# Patient Record
Sex: Female | Born: 1940
Health system: Southern US, Community
[De-identification: ages and names within clinical notes are randomized; demographics above are authoritative.]

## PROBLEM LIST (undated history)

## (undated) DIAGNOSIS — F32A Depression, unspecified: Secondary | ICD-10-CM

## (undated) DIAGNOSIS — R748 Abnormal levels of other serum enzymes: Secondary | ICD-10-CM

## (undated) DIAGNOSIS — E785 Hyperlipidemia, unspecified: Secondary | ICD-10-CM

## (undated) DIAGNOSIS — W57XXXA Bitten or stung by nonvenomous insect and other nonvenomous arthropods, initial encounter: Secondary | ICD-10-CM

## (undated) DIAGNOSIS — F419 Anxiety disorder, unspecified: Secondary | ICD-10-CM

## (undated) DIAGNOSIS — K8681 Exocrine pancreatic insufficiency: Secondary | ICD-10-CM

## (undated) DIAGNOSIS — Z780 Asymptomatic menopausal state: Secondary | ICD-10-CM

## (undated) DIAGNOSIS — C50919 Malignant neoplasm of unspecified site of unspecified female breast: Secondary | ICD-10-CM

## (undated) DIAGNOSIS — K5792 Diverticulitis of intestine, part unspecified, without perforation or abscess without bleeding: Secondary | ICD-10-CM

## (undated) DIAGNOSIS — K219 Gastro-esophageal reflux disease without esophagitis: Secondary | ICD-10-CM

## (undated) DIAGNOSIS — B880 Other acariasis: Secondary | ICD-10-CM

## (undated) DIAGNOSIS — D649 Anemia, unspecified: Secondary | ICD-10-CM

## (undated) DIAGNOSIS — E119 Type 2 diabetes mellitus without complications: Secondary | ICD-10-CM

## (undated) DIAGNOSIS — T7849XD Other allergy, subsequent encounter: Secondary | ICD-10-CM

## (undated) DIAGNOSIS — R079 Chest pain, unspecified: Secondary | ICD-10-CM

## (undated) DIAGNOSIS — N200 Calculus of kidney: Secondary | ICD-10-CM

## (undated) DIAGNOSIS — F329 Major depressive disorder, single episode, unspecified: Secondary | ICD-10-CM

## (undated) DIAGNOSIS — N814 Uterovaginal prolapse, unspecified: Secondary | ICD-10-CM

## (undated) DIAGNOSIS — J4531 Mild persistent asthma with (acute) exacerbation: Secondary | ICD-10-CM

## (undated) DIAGNOSIS — J441 Chronic obstructive pulmonary disease with (acute) exacerbation: Secondary | ICD-10-CM

## (undated) DIAGNOSIS — M5440 Lumbago with sciatica, unspecified side: Secondary | ICD-10-CM

## (undated) DIAGNOSIS — K432 Incisional hernia without obstruction or gangrene: Secondary | ICD-10-CM

## (undated) DIAGNOSIS — K8689 Other specified diseases of pancreas: Secondary | ICD-10-CM

## (undated) DIAGNOSIS — J4530 Mild persistent asthma, uncomplicated: Secondary | ICD-10-CM

## (undated) HISTORY — DX: Mild persistent asthma, uncomplicated: J45.30

## (undated) HISTORY — DX: Other allergy, subsequent encounter: T78.49XD

## (undated) HISTORY — PX: DILATION AND CURETTAGE OF UTERUS: SHX78

## (undated) HISTORY — DX: Depression, unspecified: F32.A

## (undated) HISTORY — DX: Calculus of kidney: N20.0

## (undated) HISTORY — DX: Hyperlipidemia, unspecified: E78.5

## (undated) HISTORY — DX: Other specified diseases of pancreas: K86.89

## (undated) HISTORY — DX: Mild persistent asthma with (acute) exacerbation: J45.31

## (undated) HISTORY — DX: Anxiety disorder, unspecified: F41.9

## (undated) HISTORY — PX: BREAST LUMPECTOMY: SHX2

## (undated) HISTORY — DX: Abnormal levels of other serum enzymes: R74.8

## (undated) HISTORY — DX: Incisional hernia without obstruction or gangrene: K43.2

## (undated) HISTORY — PX: WHIPPLE PROCEDURE: SHX2667

## (undated) HISTORY — DX: Malignant neoplasm of unspecified site of unspecified female breast: C50.919

## (undated) HISTORY — DX: Anemia, unspecified: D64.9

## (undated) HISTORY — DX: Asymptomatic menopausal state: Z78.0

## (undated) HISTORY — DX: Diverticulitis of intestine, part unspecified, without perforation or abscess without bleeding: K57.92

## (undated) HISTORY — DX: Major depressive disorder, single episode, unspecified: F32.9

## (undated) HISTORY — PX: HERNIA REPAIR: SHX51

## (undated) HISTORY — DX: Chest pain, unspecified: R07.9

## (undated) HISTORY — DX: Chronic obstructive pulmonary disease with (acute) exacerbation: J44.1

## (undated) HISTORY — DX: Type 2 diabetes mellitus without complications: E11.9

## (undated) HISTORY — DX: Exocrine pancreatic insufficiency: K86.81

## (undated) HISTORY — DX: Other acariasis: B88.0

## (undated) HISTORY — PX: CHOLECYSTECTOMY: SHX55

## (undated) HISTORY — PX: TUBAL LIGATION: SHX77

## (undated) HISTORY — DX: Gastro-esophageal reflux disease without esophagitis: K21.9

## (undated) HISTORY — DX: Uterovaginal prolapse, unspecified: N81.4

## (undated) HISTORY — PX: NEPHROLITHOTOMY: SUR881

## (undated) HISTORY — DX: Lumbago with sciatica, unspecified side: M54.40

## (undated) HISTORY — DX: Bitten or stung by nonvenomous insect and other nonvenomous arthropods, initial encounter: W57.XXXA

---

## 2000-03-30 ENCOUNTER — Encounter (INDEPENDENT_AMBULATORY_CARE_PROVIDER_SITE_OTHER): Payer: Self-pay | Admitting: Specialist

## 2000-03-30 ENCOUNTER — Other Ambulatory Visit: Admission: RE | Admit: 2000-03-30 | Discharge: 2000-03-30 | Payer: Self-pay | Admitting: Gastroenterology

## 2001-06-10 ENCOUNTER — Ambulatory Visit (HOSPITAL_COMMUNITY): Admission: RE | Admit: 2001-06-10 | Discharge: 2001-06-10 | Payer: Self-pay | Admitting: Family Medicine

## 2001-06-10 ENCOUNTER — Encounter: Payer: Self-pay | Admitting: Family Medicine

## 2002-04-28 HISTORY — PX: COLONOSCOPY: SHX174

## 2002-12-16 ENCOUNTER — Ambulatory Visit (HOSPITAL_COMMUNITY): Admission: RE | Admit: 2002-12-16 | Discharge: 2002-12-16 | Payer: Self-pay | Admitting: Family Medicine

## 2002-12-19 ENCOUNTER — Other Ambulatory Visit: Admission: RE | Admit: 2002-12-19 | Discharge: 2002-12-19 | Payer: Self-pay | Admitting: Dermatology

## 2003-01-03 ENCOUNTER — Other Ambulatory Visit: Admission: RE | Admit: 2003-01-03 | Discharge: 2003-01-03 | Payer: Self-pay | Admitting: Dermatology

## 2003-01-23 ENCOUNTER — Ambulatory Visit (HOSPITAL_COMMUNITY): Admission: RE | Admit: 2003-01-23 | Discharge: 2003-01-23 | Payer: Self-pay | Admitting: Otolaryngology

## 2003-01-23 ENCOUNTER — Encounter: Payer: Self-pay | Admitting: Otolaryngology

## 2004-03-08 ENCOUNTER — Ambulatory Visit (HOSPITAL_COMMUNITY): Admission: RE | Admit: 2004-03-08 | Discharge: 2004-03-08 | Payer: Self-pay

## 2005-04-29 ENCOUNTER — Ambulatory Visit (HOSPITAL_COMMUNITY): Admission: RE | Admit: 2005-04-29 | Discharge: 2005-04-29 | Payer: Self-pay | Admitting: Pulmonary Disease

## 2006-02-16 ENCOUNTER — Ambulatory Visit (HOSPITAL_COMMUNITY): Admission: RE | Admit: 2006-02-16 | Discharge: 2006-02-16 | Payer: Self-pay | Admitting: Pulmonary Disease

## 2006-05-08 ENCOUNTER — Ambulatory Visit (HOSPITAL_COMMUNITY): Admission: RE | Admit: 2006-05-08 | Discharge: 2006-05-08 | Payer: Self-pay | Admitting: Pulmonary Disease

## 2007-02-05 ENCOUNTER — Ambulatory Visit (HOSPITAL_COMMUNITY): Admission: RE | Admit: 2007-02-05 | Discharge: 2007-02-05 | Payer: Self-pay | Admitting: Pulmonary Disease

## 2007-02-06 ENCOUNTER — Inpatient Hospital Stay (HOSPITAL_COMMUNITY): Admission: EM | Admit: 2007-02-06 | Discharge: 2007-02-08 | Payer: Self-pay | Admitting: Emergency Medicine

## 2007-06-03 ENCOUNTER — Ambulatory Visit (HOSPITAL_COMMUNITY): Admission: RE | Admit: 2007-06-03 | Discharge: 2007-06-03 | Payer: Self-pay | Admitting: Pulmonary Disease

## 2008-06-13 ENCOUNTER — Ambulatory Visit (HOSPITAL_COMMUNITY): Admission: RE | Admit: 2008-06-13 | Discharge: 2008-06-13 | Payer: Self-pay | Admitting: Pulmonary Disease

## 2008-11-07 ENCOUNTER — Ambulatory Visit (HOSPITAL_COMMUNITY): Admission: RE | Admit: 2008-11-07 | Discharge: 2008-11-07 | Payer: Self-pay | Admitting: Pulmonary Disease

## 2009-07-31 ENCOUNTER — Ambulatory Visit (HOSPITAL_COMMUNITY): Admission: RE | Admit: 2009-07-31 | Discharge: 2009-07-31 | Payer: Self-pay | Admitting: Pulmonary Disease

## 2009-08-08 ENCOUNTER — Ambulatory Visit (HOSPITAL_COMMUNITY): Admission: RE | Admit: 2009-08-08 | Discharge: 2009-08-08 | Payer: Self-pay | Admitting: Pulmonary Disease

## 2009-08-15 ENCOUNTER — Encounter: Admission: RE | Admit: 2009-08-15 | Discharge: 2009-08-15 | Payer: Self-pay | Admitting: Pulmonary Disease

## 2009-08-26 DIAGNOSIS — C50919 Malignant neoplasm of unspecified site of unspecified female breast: Secondary | ICD-10-CM

## 2009-08-26 HISTORY — DX: Malignant neoplasm of unspecified site of unspecified female breast: C50.919

## 2009-08-26 HISTORY — PX: BREAST LUMPECTOMY: SHX2

## 2009-08-29 ENCOUNTER — Ambulatory Visit (HOSPITAL_COMMUNITY): Admission: RE | Admit: 2009-08-29 | Discharge: 2009-08-29 | Payer: Self-pay | Admitting: General Surgery

## 2009-10-15 ENCOUNTER — Ambulatory Visit: Admission: RE | Admit: 2009-10-15 | Discharge: 2010-01-13 | Payer: Self-pay | Admitting: Radiation Oncology

## 2010-04-28 DIAGNOSIS — K5792 Diverticulitis of intestine, part unspecified, without perforation or abscess without bleeding: Secondary | ICD-10-CM

## 2010-04-28 HISTORY — DX: Diverticulitis of intestine, part unspecified, without perforation or abscess without bleeding: K57.92

## 2010-05-17 ENCOUNTER — Other Ambulatory Visit (HOSPITAL_COMMUNITY): Payer: Self-pay | Admitting: Hematology and Oncology

## 2010-05-17 DIAGNOSIS — C50919 Malignant neoplasm of unspecified site of unspecified female breast: Secondary | ICD-10-CM

## 2010-07-16 LAB — COMPREHENSIVE METABOLIC PANEL
Albumin: 3.9 g/dL (ref 3.5–5.2)
Alkaline Phosphatase: 57 U/L (ref 39–117)
BUN: 12 mg/dL (ref 6–23)
CO2: 28 mEq/L (ref 19–32)
Chloride: 104 mEq/L (ref 96–112)
GFR calc Af Amer: >60 mL/min (ref >60)
GFR calc non Af Amer: >60 mL/min (ref >60)
Glucose, Bld: 112 mg/dL — ABNORMAL HIGH (ref 70–99)
Potassium: 4 mEq/L (ref 3.5–5.1)

## 2010-07-16 LAB — TYPE AND SCREEN
ABO/RH(D): A NEG
Antibody Screen: POSITIVE
DAT, IgG: NEGATIVE

## 2010-07-16 LAB — CBC
HCT: 38.2 % (ref 36.0–46.0)
MCHC: 35.3 g/dL (ref 30.0–36.0)
MCV: 90.1 fL (ref 78.0–100.0)
Platelets: 218 10*3/uL (ref 150–400)
RBC: 4.24 MIL/uL (ref 3.87–5.11)
RDW: 13.3 % (ref 11.5–15.5)

## 2010-08-14 ENCOUNTER — Ambulatory Visit (HOSPITAL_COMMUNITY): Payer: Medicare Other

## 2010-08-21 ENCOUNTER — Ambulatory Visit (HOSPITAL_COMMUNITY)
Admission: RE | Admit: 2010-08-21 | Discharge: 2010-08-21 | Disposition: A | Discharge status: Home / Self Care | Payer: Medicare Other | Source: Ambulatory Visit | Attending: Hematology and Oncology | Admitting: Hematology and Oncology

## 2010-08-21 ENCOUNTER — Encounter (HOSPITAL_COMMUNITY): Payer: Self-pay

## 2010-08-21 ENCOUNTER — Other Ambulatory Visit (HOSPITAL_COMMUNITY): Payer: Self-pay | Admitting: Hematology and Oncology

## 2010-08-21 DIAGNOSIS — C50912 Malignant neoplasm of unspecified site of left female breast: Secondary | ICD-10-CM

## 2010-08-21 DIAGNOSIS — Z853 Personal history of malignant neoplasm of breast: Secondary | ICD-10-CM | POA: Insufficient documentation

## 2010-08-21 DIAGNOSIS — C50919 Malignant neoplasm of unspecified site of unspecified female breast: Secondary | ICD-10-CM

## 2010-08-21 DIAGNOSIS — Z09 Encounter for follow-up examination after completed treatment for conditions other than malignant neoplasm: Secondary | ICD-10-CM | POA: Insufficient documentation

## 2010-08-21 DIAGNOSIS — J45909 Unspecified asthma, uncomplicated: Secondary | ICD-10-CM | POA: Insufficient documentation

## 2010-09-10 NOTE — H&P (Signed)
Betty Nguyen, Betty Nguyen                  ACCOUNT NO.:  [id number]   MEDICAL RECORD NO.:  [id number]          PATIENT TYPE:  INP   LOCATION:  A339                          FACILITY:  [facility]   PHYSICIAN:  Tilford Pillar, MD      DATE OF BIRTH:  1941-02-19   DATE OF ADMISSION:  02/06/2007  DATE OF DISCHARGE:  LH                              HISTORY & PHYSICAL   CHIEF COMPLAINT:  Diffuse abdominal and flank pain.   HISTORY OF PRESENT ILLNESS:  Patient is a 70 year old white female with  a history of gastroesophageal reflux and asthma who had an approximate 4  day history of crampy abdominal pain, started insidiously and included  her bilateral flanks.  There is no radiation other than to the flanks.  There is no exacerbating or relieving features.  She described the pain  as crampy with intermittent sharp pain.  She had never had symptoms like  this before.  She did have a history of renal calculi but stated that  pain was different than her current pain.  She did notice an increase in  discolor of her urine with darkening of her urine, no change in bowel  habits, denies any hematochezia or melena.  She had not had any fever or  chills.  She did report having a colonoscopy in 2004 which demonstrated  diverticulum.  It did not demonstrate any polyps or masses.   PAST MEDICAL HISTORY:  Pertinent for:  1. Gastroesophageal reflux disease.  2. Asthma.  3. Renal calculi.   PAST SURGICAL HISTORY:  Positive for D&C.   MEDICATIONS:  Azmacort and Prilosec.   ALLERGIES:  1. AMOXICILLIN, RASH.  2. CODEINE, NAUSEA.   SOCIAL HISTORY:  1. Negative tobacco use.  2. Negative alcohol use.  3. No recreational drug use.   REVIEW OF SYSTEMS:  CONSTITUTION:  No significant weight changes.  EYES:  Unremarkable.  EARS, NOSE, THROAT:  Unremarkable.  PULMONARY:  Positive  for occasional shortness of breath secondary to her asthma as above-  mentioned.  CARDIOVASCULAR:  Unremarkable.  GASTROINTESTINAL:  See  HPI.  MUSCULOSKELETAL:  Unremarkable.  ENDOCRINE:  Unremarkable.  NEURO:  Unremarkable.   PHYSICAL EXAM:  VITALS:  Temperature 98, heart rate 66, respiratory rate  16, BP 112/63, O2 sat 93% on room air.  GENERAL:  No acute distress.  Alert and oriented x3.  HEENT:  Pupils equal, round, reactive, extraocular movements are intact.  No conjunctival pallor.  No cervical adenopathy.  PULMONARY:  Clear to auscultation with unlabored respirations.  CARDIOVASCULAR:  Regular rate and rhythm.  No murmurs, gallops or rubs  are apparent on examination.  ABDOMINAL EXAM:  Decreased bowel sounds.  Abdomen is soft and obese,  nondistended.  Mild left lower quadrant tenderness.  No peritoneal  signs, no hernias are apparent, no masses are palpated.  EXTREMITIES:  Warm and dry.   PERTINENT LABORATORY AND RADIOGRAPHIC FINDINGS:  A CT evaluation of the  abdomen and pelvis demonstrated positive inflammatory changes around the  sigmoid colon with apparent phlegmon development, no evidence of any  free fluid or  free air and no defined abscess pocket is apparent.   LABORATORY:  CBC:  White blood cell count 11.5 on admission, 13.2.  Hemoglobin, hematocrit 39, platelets 288,000.  Chemistry:  Sodium 141,  potassium 4, chloride 107, bicarbonate 28, BUN 10, creatinine 0.78,  glucose 123, calcium 9.  Urinalysis:  Positive for trace blood and  ketones, some white blood count __________ and positive for bacteria.   ASSESSMENT AND PLAN:  1. Diverticulitis, uncomplicated.  Will continue antibiotics,      currently receiving Cipro and Flagyl.  Continue IV fluid hydration.      I discussed with patient at length the indications for operation      which would include increasing her symptoms and increasing pain,      increasing white blood cell count despite conservative management      with antibiotics or increasing fever or chills, of which patient is      currently not reporting any.  As this has been our first bout  of      diverticulitis, again conservative management would be the      preferred management at this time with no interval resection in the      future planned.  However, it was discussed with the patient that      future bouts would likely elicit a need for operation with sigmoid      colectomy should she have a future bout.  Patient understands this      and understands that should anything change in her current      presentation an operation may still be indicated.  2. Urinary tract infection.  Based on her laboratory evaluation, it is      suspected that she does have coinciding infection.  This should be      covered with the current antibiotic regimen and will be monitored      as well.      Tilford Pillar, MD  Electronically Signed     BZ/MEDQ  D:  02/07/2007  T:  02/07/2007  Job:  [id number]   cc:   Dalia Heading, M.D.  Fax: [phone]   Oneal Deputy. Juanetta Gosling, M.D.  Fax: [phone]

## 2010-09-10 NOTE — Discharge Summary (Signed)
Betty Nguyen, Betty Nguyen NO.:  [id number]   MEDICAL RECORD NO.:  [id number]          PATIENT TYPE:  INP   LOCATION:  A339                          FACILITY:  [facility]   PHYSICIAN:  Dalia Heading, M.D.  DATE OF BIRTH:  [birth year]   DATE OF ADMISSION:  02/06/2007  DATE OF DISCHARGE:  10/13/2008LH                               DISCHARGE SUMMARY   HOSPITAL COURSE:  The patient is a 70 year old white female who  presented to the emergency room with lower abdominal pain and left flank  pain.  CT scan of the abdomen and pelvis was performed which revealed  sigmoid diverticulitis.  In addition, there were nonspecific cystic  lesions seen in the pancreas.  An MRI of the pancreas was suggested in  the future.  The patient was admitted to the hospital for treatment of  her sigmoid diverticulitis.  Her white blood cell count subsequently  improved.  Her abdominal pain resolved.  Diet was starting to be  advanced without difficulty.   Of note is the fact that the patient had a colonoscopy in 2004 by  another physician which was reportedly unremarkable.  The patient is  being discharged home on hospital day #2 in good and improving  condition.   DISCHARGE INSTRUCTIONS:  The patient is to follow up with Dr. Lovell Sheehan or  Dr. Leticia Penna on a February 16, 2007.   DISCHARGE MEDICATIONS:  1. Azmacort as previously prescribed.  2. Prilosec as previously prescribed.  3. Ciprofloxacin 500 mg p.o. b.i.d. x10 days.  4. Flagyl 250 mg p.o. 3 times a day x10 days.   PRINCIPAL DIAGNOSES:  1. Sigmoid diverticulitis, first episode.  2. Nonspecific lesions in pancreas.  3. Chronic obstructive pulmonary disease.  4. History of reflux disease.   PROCEDURES:  None.      Dalia Heading, M.D.  Electronically Signed    MAJ/MEDQ  D:  02/08/2007  T:  02/08/2007  Job:  [id number]   cc:   Ramon Dredge L. Juanetta Gosling, M.D.  Fax: [phone]

## 2010-12-25 ENCOUNTER — Other Ambulatory Visit (HOSPITAL_COMMUNITY): Payer: Self-pay | Admitting: Pulmonary Disease

## 2010-12-25 ENCOUNTER — Ambulatory Visit (HOSPITAL_COMMUNITY)
Admission: RE | Admit: 2010-12-25 | Discharge: 2010-12-25 | Disposition: A | Discharge status: Home / Self Care | Payer: Medicare Other | Source: Ambulatory Visit | Attending: Pulmonary Disease | Admitting: Pulmonary Disease

## 2010-12-25 DIAGNOSIS — M25529 Pain in unspecified elbow: Secondary | ICD-10-CM | POA: Insufficient documentation

## 2010-12-25 DIAGNOSIS — M25549 Pain in joints of unspecified hand: Secondary | ICD-10-CM | POA: Insufficient documentation

## 2010-12-25 DIAGNOSIS — R609 Edema, unspecified: Secondary | ICD-10-CM

## 2010-12-25 DIAGNOSIS — R52 Pain, unspecified: Secondary | ICD-10-CM

## 2010-12-25 DIAGNOSIS — M7989 Other specified soft tissue disorders: Secondary | ICD-10-CM | POA: Insufficient documentation

## 2011-02-06 ENCOUNTER — Ambulatory Visit (HOSPITAL_COMMUNITY)
Admission: RE | Admit: 2011-02-06 | Discharge: 2011-02-06 | Disposition: A | Discharge status: Home / Self Care | Payer: Medicare Other | Source: Ambulatory Visit | Attending: Pulmonary Disease | Admitting: Pulmonary Disease

## 2011-02-06 ENCOUNTER — Other Ambulatory Visit (HOSPITAL_COMMUNITY): Payer: Self-pay | Admitting: Pulmonary Disease

## 2011-02-06 DIAGNOSIS — M79609 Pain in unspecified limb: Secondary | ICD-10-CM | POA: Insufficient documentation

## 2011-02-06 DIAGNOSIS — R609 Edema, unspecified: Secondary | ICD-10-CM

## 2011-02-06 DIAGNOSIS — I8000 Phlebitis and thrombophlebitis of superficial vessels of unspecified lower extremity: Secondary | ICD-10-CM | POA: Insufficient documentation

## 2011-02-06 DIAGNOSIS — M7989 Other specified soft tissue disorders: Secondary | ICD-10-CM | POA: Insufficient documentation

## 2011-02-06 LAB — COMPREHENSIVE METABOLIC PANEL
ALT: 100 — ABNORMAL HIGH
AST: 67 — ABNORMAL HIGH
Alkaline Phosphatase: 105
BUN: 13
Calcium: 9.2
Chloride: 103
Creatinine, Ser: 0.93
GFR calc Af Amer: >60
GFR calc non Af Amer: >60
Potassium: 4.4
Sodium: 139

## 2011-02-06 LAB — URINALYSIS, ROUTINE W REFLEX MICROSCOPIC
Bilirubin Urine: NEGATIVE
Nitrite: NEGATIVE
Specific Gravity, Urine: >1.03 — ABNORMAL HIGH

## 2011-02-06 LAB — LIPASE, BLOOD: Lipase: 17

## 2011-02-06 LAB — CBC
HCT: 33.8 — ABNORMAL LOW
HCT: 38.4
Hemoglobin: 11.4 — ABNORMAL LOW
Hemoglobin: 13
MCHC: 33.8
MCHC: 34
MCV: 89
Platelets: 276
Platelets: 288
RBC: 3.78 — ABNORMAL LOW
RBC: 4.38
RDW: 12.8
WBC: 11.5 — ABNORMAL HIGH
WBC: 6

## 2011-02-06 LAB — BASIC METABOLIC PANEL
CO2: 28
Calcium: 8.7
Calcium: 9
Creatinine, Ser: 0.78
GFR calc Af Amer: >60
GFR calc non Af Amer: >60
GFR calc non Af Amer: >60
Glucose, Bld: 123 — ABNORMAL HIGH
Potassium: 4.1
Sodium: 139

## 2011-02-06 LAB — DIFFERENTIAL
Basophils Absolute: 0
Basophils Absolute: 0
Basophils Relative: 0
Eosinophils Absolute: 0.2
Lymphocytes Relative: 23
Lymphocytes Relative: 38
Lymphs Abs: 2.3
Monocytes Absolute: 0.4
Monocytes Relative: 6
Neutro Abs: 3.1
Neutro Abs: 4.5
Neutro Abs: 7.9 — ABNORMAL HIGH
Neutrophils Relative %: 60

## 2011-02-06 LAB — URINE MICROSCOPIC-ADD ON

## 2011-03-11 ENCOUNTER — Other Ambulatory Visit (HOSPITAL_COMMUNITY): Payer: Self-pay | Admitting: Hematology and Oncology

## 2011-03-11 DIAGNOSIS — C50919 Malignant neoplasm of unspecified site of unspecified female breast: Secondary | ICD-10-CM

## 2011-07-07 DIAGNOSIS — J45901 Unspecified asthma with (acute) exacerbation: Secondary | ICD-10-CM | POA: Diagnosis not present

## 2011-07-07 DIAGNOSIS — J3089 Other allergic rhinitis: Secondary | ICD-10-CM | POA: Diagnosis not present

## 2011-07-07 DIAGNOSIS — J441 Chronic obstructive pulmonary disease with (acute) exacerbation: Secondary | ICD-10-CM | POA: Diagnosis not present

## 2011-07-07 DIAGNOSIS — J029 Acute pharyngitis, unspecified: Secondary | ICD-10-CM | POA: Diagnosis not present

## 2011-08-17 ENCOUNTER — Emergency Department (HOSPITAL_COMMUNITY): Payer: Medicare Other

## 2011-08-17 ENCOUNTER — Emergency Department (HOSPITAL_COMMUNITY)
Admission: EM | Admit: 2011-08-17 | Discharge: 2011-08-18 | Disposition: A | Discharge status: Home / Self Care | Payer: Medicare Other | Attending: Emergency Medicine | Admitting: Emergency Medicine

## 2011-08-17 ENCOUNTER — Encounter (HOSPITAL_COMMUNITY): Payer: Self-pay | Admitting: Emergency Medicine

## 2011-08-17 DIAGNOSIS — S6390XA Sprain of unspecified part of unspecified wrist and hand, initial encounter: Secondary | ICD-10-CM | POA: Insufficient documentation

## 2011-08-17 DIAGNOSIS — R209 Unspecified disturbances of skin sensation: Secondary | ICD-10-CM | POA: Insufficient documentation

## 2011-08-17 DIAGNOSIS — S63509A Unspecified sprain of unspecified wrist, initial encounter: Secondary | ICD-10-CM | POA: Diagnosis not present

## 2011-08-17 DIAGNOSIS — J45909 Unspecified asthma, uncomplicated: Secondary | ICD-10-CM | POA: Insufficient documentation

## 2011-08-17 DIAGNOSIS — W010XXA Fall on same level from slipping, tripping and stumbling without subsequent striking against object, initial encounter: Secondary | ICD-10-CM | POA: Insufficient documentation

## 2011-08-17 DIAGNOSIS — S63502A Unspecified sprain of left wrist, initial encounter: Secondary | ICD-10-CM

## 2011-08-17 DIAGNOSIS — M25539 Pain in unspecified wrist: Secondary | ICD-10-CM | POA: Diagnosis not present

## 2011-08-17 DIAGNOSIS — Y92009 Unspecified place in unspecified non-institutional (private) residence as the place of occurrence of the external cause: Secondary | ICD-10-CM | POA: Insufficient documentation

## 2011-08-17 DIAGNOSIS — S6392XA Sprain of unspecified part of left wrist and hand, initial encounter: Secondary | ICD-10-CM

## 2011-08-17 NOTE — ED Provider Notes (Signed)
History  This chart was scribed for Felisa Bonier, MD by Bennett Scrape. This patient was seen in room APA11/APA11 and the patient's care was started at 11:40PM.  CSN: [id number]  Arrival date & time 08/17/11  2203   First MD Initiated Contact with Patient 08/17/11 2312      Chief Complaint  Patient presents with  . Wrist Pain  . Fall    The history is provided by the patient. No language interpreter was used.     Betty Nguyen is a 71 y.o. female who presents to the Emergency Department complaining of a fall that occurred about 10 hours ago. Pt states that she tripped over a large twig in her yard and fell onto both hands and knees. She c/o left hand pain described as soreness. She states that the pain is felt throughout the top of her left wrist. The pain is worse with pressure and movement. She reports taking tylenol and using cream with moderate improvement. She denies any other injuries at this time. She states that her main concern comes from when she had radiation for breast cancer. She states that she was advised to be evaluated after falls or other incidents where she could be hurt due to the radiation. She denies fever, congestion, hip pain and LOC. She has a h/o asthma. She denies smoking and alcohol use.   Past Medical History  Diagnosis Date  . Cancer   . Asthma     History reviewed. No pertinent past surgical history.  No family history on file.  History  Substance Use Topics  . Smoking status: Never Smoker   . Smokeless tobacco: Not on file  . Alcohol Use: No     Review of Systems  Constitutional: Negative for fever and chills.  HENT: Negative for congestion, sore throat and neck pain.   Eyes: Negative for visual disturbance.  Respiratory: Negative for cough and shortness of breath.   Cardiovascular: Negative for chest pain.  Gastrointestinal: Negative for nausea, vomiting, abdominal pain and diarrhea.  Genitourinary: Negative for dysuria, urgency and  hematuria.  Musculoskeletal: Negative for back pain.       Left wrist pain  Skin: Negative for rash.  Neurological: Negative for seizures and headaches.  Psychiatric/Behavioral: Negative for confusion.    Allergies  Codeine  Home Medications  No current outpatient prescriptions on file.  Triage Vitals: BP 150/81  Pulse 73  Temp(Src) 98.1 F (36.7 C) (Oral)  Resp 16  Ht 5\' 1"  (1.549 m)  Wt 155 lb (70.308 kg)  BMI 29.29 kg/m2  SpO2 100%  Physical Exam  Nursing note and vitals reviewed. Constitutional: She is oriented to person, place, and time. She appears well-developed and well-nourished. No distress.  HENT:  Head: Normocephalic and atraumatic.  Eyes: Conjunctivae and EOM are normal. Pupils are equal, round, and reactive to light.  Neck: Normal range of motion. Neck supple.  Cardiovascular: Normal rate, regular rhythm, normal heart sounds and intact distal pulses.   Pulmonary/Chest: Effort normal and breath sounds normal. No accessory muscle usage. No respiratory distress. She has no wheezes. She has no rales.       Lungs are clear to ausculation, no rhonchi noted  Abdominal: Soft. Normal appearance and bowel sounds are normal. She exhibits no distension. There is no tenderness. There is no rebound and no guarding.  Musculoskeletal: Normal range of motion. She exhibits tenderness. She exhibits no edema.       Pain at wrist and dorsum of left  hand over the second and third metacarpal and palmar aspect of second and digit digit, pain is worse with supination, ulnar deviation at the wrist or grasping an object, no obvious bony deformity, intrinsic opposition of the thumb to all fingers is intact, abuduction and adduction of all fingers is intact, normal ROM to the wrist, fingers and hand, no injury proximal to the wrist or elsewhere  Neurological: She is alert and oriented to person, place, and time. She has normal strength and normal reflexes. No cranial nerve deficit. Coordination  normal. GCS eye subscore is 4. GCS verbal subscore is 5. GCS motor subscore is 6.  Skin: Skin is warm and dry.  Psychiatric: She has a normal mood and affect. Her behavior is normal. Judgment and thought content normal.    ED Course  Procedures (including critical care time)  DIAGNOSTIC STUDIES: Oxygen Saturation is 100% on room air, normal by my interpretation.    COORDINATION OF CARE: 11:44PM-Discussed negative radiology report with the pt and the pt acknowledged the results. Wrist splint to immobilize the wrist and hand. Advised pt to elevate hand and ice throughout the night and tomorrow morning to reduce the swelling.   Labs Reviewed - No data to display  Dg Wrist Complete Left  08/17/2011  *RADIOLOGY REPORT*  Clinical Data: Fall, left wrist pain  LEFT WRIST - COMPLETE 3+ VIEW  Comparison: 12/25/2010  Findings: Probable cassette artifact noted mimicking radiopaque foreign bodies.  No fracture or dislocation.  No soft tissue abnormality.  IMPRESSION: No fracture or dislocation.  Original Report Authenticated By: Harrel Lemon, M.D.   Images of the left wrist and metacarpal bones reviewed by me.  No apparent acute osseous injury including in the region of the metacarpals where the patient reports discomfort. This study appears adequate to exclude any acute osseous injury in the left hand and wrist, based on history and physical examination, and therefore the patient appears to have a sprain of the left wrist and hand. No diagnosis found.    MDM  Fall onto an outstretched left hand with no other injuries other than tenderness and some pain at the left wrist and left hand over the metacarpals 2 and 3. No apparent fracture or other acute osseous injury based on radiographs and clinical examination. The patient appears to have a sprain of her left wrist and hand. I will place her in a left wrist Velcro splint to protect the joint and immobilize it during healing, discussed cryotherapy and  elevation, and will discharge the patient home. The patient states her understanding of and agreement with the plan of care.    I personally performed the services described in this documentation, which was scribed in my presence. The recorded information has been reviewed and considered.   Felisa Bonier, MD 08/18/11 0030

## 2011-08-17 NOTE — ED Notes (Signed)
Pt tripped over stump in yard and broke her fall with her hands.  Pt cc of left wrist/hand pain.  Denies hitting her head or LOC

## 2011-08-18 MED ORDER — ACETAMINOPHEN 500 MG PO TABS: 1000.0000 mg | ORAL_TABLET | Freq: Four times a day (QID) | ORAL | Status: AC | PRN

## 2011-08-18 NOTE — Discharge Instructions (Signed)
Cryotherapy Cryotherapy means treatment with cold. Ice or gel packs can be used to reduce both pain and swelling. Ice is the most helpful within the first 24 to 48 hours after an injury or flareup from overusing a muscle or joint. Sprains, strains, spasms, burning pain, shooting pain, and aches can all be eased with ice. Ice can also be used when recovering from surgery. Ice is effective, has very few side effects, and is safe for most people to use. PRECAUTIONS  Ice is not a safe treatment option for people with:  Raynaud's phenomenon. This is a condition affecting small blood vessels in the extremities. Exposure to cold may cause your problems to return.   Cold hypersensitivity. There are many forms of cold hypersensitivity, including:   Cold urticaria. Red, itchy hives appear on the skin when the tissues begin to warm after being iced.   Cold erythema. This is a red, itchy rash caused by exposure to cold.   Cold hemoglobinuria. Red blood cells break down when the tissues begin to warm after being iced. The hemoglobin that carry oxygen are passed into the urine because they cannot combine with blood proteins fast enough.   Numbness or altered sensitivity in the area being iced.  If you have any of the following conditions, do not use ice until you have discussed cryotherapy with your caregiver:  Heart conditions, such as arrhythmia, angina, or chronic heart disease.   High blood pressure.   Healing wounds or open skin in the area being iced.   Current infections.   Rheumatoid arthritis.   Poor circulation.   Diabetes.  Ice slows the blood flow in the region it is applied. This is beneficial when trying to stop inflamed tissues from spreading irritating chemicals to surrounding tissues. However, if you expose your skin to cold temperatures for too long or without the proper protection, you can damage your skin or nerves. Watch for signs of skin damage due to cold. HOME CARE  INSTRUCTIONS Follow these tips to use ice and cold packs safely.  Place a dry or damp towel between the ice and skin. A damp towel will cool the skin more quickly, so you may need to shorten the time that the ice is used.   For a more rapid response, add gentle compression to the ice.   Ice for no more than 10 to 20 minutes at a time. The bonier the area you are icing, the less time it will take to get the benefits of ice.   Check your skin after 5 minutes to make sure there are no signs of a poor response to cold or skin damage.   Rest 20 minutes or more in between uses.   Once your skin is numb, you can end your treatment. You can test numbness by very lightly touching your skin. The touch should be so light that you do not see the skin dimple from the pressure of your fingertip. When using ice, most people will feel these normal sensations in this order: cold, burning, aching, and numbness.   Do not use ice on someone who cannot communicate their responses to pain, such as small children or people with dementia.  HOW TO MAKE AN ICE PACK Ice packs are the most common way to use ice therapy. Other methods include ice massage, ice baths, and cryo-sprays. Muscle creams that cause a cold, tingly feeling do not offer the same benefits that ice offers and should not be used as a substitute  unless recommended by your caregiver. To make an ice pack, do one of the following:  Place crushed ice or a bag of frozen vegetables in a sealable plastic bag. Squeeze out the excess air. Place this bag inside another plastic bag. Slide the bag into a pillowcase or place a damp towel between your skin and the bag.   Mix 3 parts water with 1 part rubbing alcohol. Freeze the mixture in a sealable plastic bag. When you remove the mixture from the freezer, it will be slushy. Squeeze out the excess air. Place this bag inside another plastic bag. Slide the bag into a pillowcase or place a damp towel between your skin  and the bag.  SEEK MEDICAL CARE IF:  You develop white spots on your skin. This may give the skin a blotchy (mottled) appearance.   Your skin turns blue or pale.   Your skin becomes waxy or hard.   Your swelling gets worse.  MAKE SURE YOU:   Understand these instructions.   Will watch your condition.   Will get help right away if you are not doing well or get worse.  Document Released: 12/09/2010 Document Revised: 04/03/2011 Document Reviewed: 12/09/2010 [facility] Patient Information 2012 [place], [place].Joint Sprain A sprain is a tear or stretch in the ligaments that hold a joint together. Severe sprains may need as long as 3-6 weeks of immobilization and/or exercises to heal completely. Sprained joints should be rested and protected. If not, they can become unstable and prone to re-injury. Proper treatment can reduce your pain, shorten the period of disability, and reduce the risk of repeated injuries. TREATMENT   Rest and elevate the injured joint to reduce pain and swelling.   Apply ice packs to the injury for 20-30 minutes every 2-3 hours for the next 2-3 days.   Keep the injury wrapped in a compression bandage or splint as long as the joint is painful or as instructed by your caregiver.   Do not use the injured joint until it is completely healed to prevent re-injury and chronic instability. Follow the instructions of your caregiver.   Long-term sprain management may require exercises and/or treatment by a physical therapist. Taping or special braces may help stabilize the joint until it is completely better.  SEEK MEDICAL CARE IF:   You develop increased pain or swelling of the joint.   You develop increasing redness and warmth of the joint.   You develop a fever.   It becomes stiff.   Your hand or foot gets cold or numb.  Document Released: 05/22/2004 Document Revised: 04/03/2011 Document Reviewed: 05/01/2008 [facility] Patient Information 2012 [place],  [place].Sprains Sprains are painful injuries to joints as a result of partial or complete tearing of ligaments. HOME CARE INSTRUCTIONS   For the first 24 hours, keep the injured limb raised on 2 pillows while lying down.   Apply ice bags about every 2 hours for 20 to 30 minutes, while awake, to the injured area for the first 24 hours. Then apply as directed by your caregiver. Place the ice in a plastic bag with a towel around it to prevent frostbite to the skin.   Only take over-the-counter or prescription medicines for pain, discomfort, or fever as directed by your caregiver.   If an ace bandage (a stretchy, elastic wrapping bandage) has been applied today, remove and reapply every 3 to 4 hours. Apply firm enough to keep swelling down. Donot apply tightly. Watch fingers or toes for swelling, bluish discoloration,  coldness, numbness, or excessive pain. If any of these problems (symptoms) occur, remove the ace bandage and reapply it more loosely. Contact your caregiver or return to this location if these symptoms persist.  Persistent pain and inability to use the injured area for more than 2 to 3 days are warning signs. See a caregiver for a follow-up visit as soon as possible. A hairline fracture (broken bone) may not show on X-rays. Persistent pain and swelling indicate that further evaluation, use of crutches, and/or more X-rays are needed. X-rays may sometimes not show a small fracture until a week or ten days later. Make a follow-up appointment with your own caregiver or to whom we have referred you. A specialist in reading X-rays(radiologist) will re-read your X-rays. Make sure you know how to obtain your X-ray results. Do not assume everything is normal if you do not hear from [place]. SEEK IMMEDIATE MEDICAL CARE IF:  You develop severe pain or more swelling.   The pain is not controlled with medicine.   Your skin or nails below the injury turn blue or grey or feel cold or numb.  Document Released:  04/11/2000 Document Revised: 04/03/2011 Document Reviewed: 11/29/2007 [facility] Patient Information 2012 [place], [place].Wrist Pain Wrist injuries are frequent in adults and children. A sprain is an injury to the ligaments that hold your bones together. A strain is an injury to muscle or muscle cord-like structures (tendons) from stretching or pulling. Generally, when wrists are moderately tender to touch following a fall or injury, a break in the bone (fracture) may be present. Most wrist sprains or strains are better in 3 to 5 days, but complete healing may take several weeks. HOME CARE INSTRUCTIONS   Put ice on the injured area.   Put ice in a plastic bag.   Place a towel between your skin and the bag.   Leave the ice on for 15 to 20 minutes, 3 to 4 times a day, for the first 2 days.   Keep your arm raised above the level of your heart whenever possible to reduce swelling and pain.   Rest the injured area for at least 48 hours or as directed by your caregiver.   If a splint or elastic bandage has been applied, use it for as long as directed by your caregiver or until seen by a caregiver for a follow-up exam.   Only take over-the-counter or prescription medicines for pain, discomfort, or fever as directed by your caregiver.   Keep all follow-up appointments. You may need to follow up with a specialist or have follow-up X-rays. Improvement in pain level is not a guarantee that you did not fracture a bone in your wrist. The only way to determine whether or not you have a broken bone is by X-ray.  SEEK IMMEDIATE MEDICAL CARE IF:   Your fingers are swollen, very red, white, or cold and blue.   Your fingers are numb or tingling.   You have increasing pain.   You have difficulty moving your fingers.  MAKE SURE YOU:   Understand these instructions.   Will watch your condition.   Will get help right away if you are not doing well or get worse.  Document Released: 01/22/2005 Document  Revised: 04/03/2011 Document Reviewed: 06/05/2010 [facility] Patient Information 2012 [place], [place].

## 2011-08-21 DIAGNOSIS — J45901 Unspecified asthma with (acute) exacerbation: Secondary | ICD-10-CM | POA: Diagnosis not present

## 2011-08-21 DIAGNOSIS — J449 Chronic obstructive pulmonary disease, unspecified: Secondary | ICD-10-CM | POA: Diagnosis not present

## 2011-08-21 DIAGNOSIS — J441 Chronic obstructive pulmonary disease with (acute) exacerbation: Secondary | ICD-10-CM | POA: Diagnosis not present

## 2011-08-21 DIAGNOSIS — M25539 Pain in unspecified wrist: Secondary | ICD-10-CM | POA: Diagnosis not present

## 2011-08-27 ENCOUNTER — Ambulatory Visit (HOSPITAL_COMMUNITY)
Admission: RE | Admit: 2011-08-27 | Discharge: 2011-08-27 | Disposition: A | Discharge status: Home / Self Care | Payer: Medicare Other | Source: Ambulatory Visit | Attending: Hematology and Oncology | Admitting: Hematology and Oncology

## 2011-08-27 DIAGNOSIS — C50919 Malignant neoplasm of unspecified site of unspecified female breast: Secondary | ICD-10-CM

## 2011-08-27 DIAGNOSIS — Z09 Encounter for follow-up examination after completed treatment for conditions other than malignant neoplasm: Secondary | ICD-10-CM | POA: Insufficient documentation

## 2011-08-27 DIAGNOSIS — Z853 Personal history of malignant neoplasm of breast: Secondary | ICD-10-CM | POA: Diagnosis not present

## 2011-10-27 DIAGNOSIS — M949 Disorder of cartilage, unspecified: Secondary | ICD-10-CM | POA: Diagnosis not present

## 2011-10-27 DIAGNOSIS — M899 Disorder of bone, unspecified: Secondary | ICD-10-CM | POA: Diagnosis not present

## 2011-10-27 DIAGNOSIS — C50919 Malignant neoplasm of unspecified site of unspecified female breast: Secondary | ICD-10-CM | POA: Diagnosis not present

## 2011-10-27 DIAGNOSIS — Z780 Asymptomatic menopausal state: Secondary | ICD-10-CM | POA: Diagnosis not present

## 2011-11-03 ENCOUNTER — Encounter: Payer: Medicare Other | Admitting: Hematology and Oncology

## 2011-11-03 DIAGNOSIS — J449 Chronic obstructive pulmonary disease, unspecified: Secondary | ICD-10-CM

## 2011-11-03 DIAGNOSIS — C50919 Malignant neoplasm of unspecified site of unspecified female breast: Secondary | ICD-10-CM

## 2011-11-03 DIAGNOSIS — M949 Disorder of cartilage, unspecified: Secondary | ICD-10-CM

## 2011-11-03 DIAGNOSIS — M899 Disorder of bone, unspecified: Secondary | ICD-10-CM

## 2011-12-22 DIAGNOSIS — M949 Disorder of cartilage, unspecified: Secondary | ICD-10-CM

## 2011-12-22 DIAGNOSIS — M899 Disorder of bone, unspecified: Secondary | ICD-10-CM | POA: Diagnosis not present

## 2011-12-22 DIAGNOSIS — C50919 Malignant neoplasm of unspecified site of unspecified female breast: Secondary | ICD-10-CM | POA: Diagnosis not present

## 2011-12-22 DIAGNOSIS — N63 Unspecified lump in unspecified breast: Secondary | ICD-10-CM | POA: Diagnosis not present

## 2011-12-23 ENCOUNTER — Other Ambulatory Visit: Payer: Self-pay | Admitting: Hematology and Oncology

## 2011-12-23 DIAGNOSIS — N63 Unspecified lump in unspecified breast: Secondary | ICD-10-CM

## 2011-12-31 ENCOUNTER — Ambulatory Visit (HOSPITAL_COMMUNITY)
Admission: RE | Admit: 2011-12-31 | Discharge: 2011-12-31 | Disposition: A | Discharge status: Home / Self Care | Payer: Medicare Other | Source: Ambulatory Visit | Attending: Hematology and Oncology | Admitting: Hematology and Oncology

## 2011-12-31 ENCOUNTER — Other Ambulatory Visit: Payer: Self-pay | Admitting: Hematology and Oncology

## 2011-12-31 DIAGNOSIS — N63 Unspecified lump in unspecified breast: Secondary | ICD-10-CM

## 2011-12-31 DIAGNOSIS — E109 Type 1 diabetes mellitus without complications: Secondary | ICD-10-CM | POA: Diagnosis not present

## 2011-12-31 DIAGNOSIS — J449 Chronic obstructive pulmonary disease, unspecified: Secondary | ICD-10-CM | POA: Diagnosis not present

## 2011-12-31 DIAGNOSIS — J309 Allergic rhinitis, unspecified: Secondary | ICD-10-CM | POA: Diagnosis not present

## 2011-12-31 DIAGNOSIS — J45909 Unspecified asthma, uncomplicated: Secondary | ICD-10-CM | POA: Diagnosis not present

## 2011-12-31 DIAGNOSIS — M62838 Other muscle spasm: Secondary | ICD-10-CM | POA: Diagnosis not present

## 2012-01-02 ENCOUNTER — Other Ambulatory Visit: Payer: Self-pay | Admitting: Hematology and Oncology

## 2012-01-02 ENCOUNTER — Encounter: Payer: Medicare Other | Admitting: Hematology and Oncology

## 2012-01-02 DIAGNOSIS — Z09 Encounter for follow-up examination after completed treatment for conditions other than malignant neoplasm: Secondary | ICD-10-CM

## 2012-01-02 DIAGNOSIS — C50919 Malignant neoplasm of unspecified site of unspecified female breast: Secondary | ICD-10-CM | POA: Diagnosis not present

## 2012-01-02 DIAGNOSIS — M899 Disorder of bone, unspecified: Secondary | ICD-10-CM

## 2012-01-02 DIAGNOSIS — M949 Disorder of cartilage, unspecified: Secondary | ICD-10-CM | POA: Diagnosis not present

## 2012-01-02 DIAGNOSIS — J45909 Unspecified asthma, uncomplicated: Secondary | ICD-10-CM | POA: Diagnosis not present

## 2012-02-09 DIAGNOSIS — N390 Urinary tract infection, site not specified: Secondary | ICD-10-CM | POA: Diagnosis not present

## 2012-02-09 DIAGNOSIS — Z23 Encounter for immunization: Secondary | ICD-10-CM | POA: Diagnosis not present

## 2012-04-01 DIAGNOSIS — J45902 Unspecified asthma with status asthmaticus: Secondary | ICD-10-CM | POA: Diagnosis not present

## 2012-04-01 DIAGNOSIS — E785 Hyperlipidemia, unspecified: Secondary | ICD-10-CM | POA: Diagnosis not present

## 2012-04-01 DIAGNOSIS — F411 Generalized anxiety disorder: Secondary | ICD-10-CM | POA: Diagnosis not present

## 2012-04-01 DIAGNOSIS — E109 Type 1 diabetes mellitus without complications: Secondary | ICD-10-CM | POA: Diagnosis not present

## 2012-05-03 DIAGNOSIS — C50919 Malignant neoplasm of unspecified site of unspecified female breast: Secondary | ICD-10-CM | POA: Diagnosis not present

## 2012-05-31 DIAGNOSIS — H524 Presbyopia: Secondary | ICD-10-CM | POA: Diagnosis not present

## 2012-05-31 DIAGNOSIS — D3130 Benign neoplasm of unspecified choroid: Secondary | ICD-10-CM | POA: Diagnosis not present

## 2012-05-31 DIAGNOSIS — H2510 Age-related nuclear cataract, unspecified eye: Secondary | ICD-10-CM | POA: Diagnosis not present

## 2012-05-31 DIAGNOSIS — H5200 Hypermetropia, unspecified eye: Secondary | ICD-10-CM | POA: Diagnosis not present

## 2012-06-14 DIAGNOSIS — N390 Urinary tract infection, site not specified: Secondary | ICD-10-CM | POA: Diagnosis not present

## 2012-07-07 ENCOUNTER — Other Ambulatory Visit (HOSPITAL_COMMUNITY): Payer: Self-pay | Admitting: Pulmonary Disease

## 2012-07-07 DIAGNOSIS — J441 Chronic obstructive pulmonary disease with (acute) exacerbation: Secondary | ICD-10-CM | POA: Diagnosis not present

## 2012-07-07 DIAGNOSIS — K5732 Diverticulitis of large intestine without perforation or abscess without bleeding: Secondary | ICD-10-CM | POA: Diagnosis not present

## 2012-07-07 DIAGNOSIS — I739 Peripheral vascular disease, unspecified: Secondary | ICD-10-CM

## 2012-07-07 DIAGNOSIS — E109 Type 1 diabetes mellitus without complications: Secondary | ICD-10-CM | POA: Diagnosis not present

## 2012-07-07 DIAGNOSIS — N390 Urinary tract infection, site not specified: Secondary | ICD-10-CM | POA: Diagnosis not present

## 2012-07-07 DIAGNOSIS — J45901 Unspecified asthma with (acute) exacerbation: Secondary | ICD-10-CM | POA: Diagnosis not present

## 2012-07-07 DIAGNOSIS — R079 Chest pain, unspecified: Secondary | ICD-10-CM | POA: Diagnosis not present

## 2012-07-08 ENCOUNTER — Other Ambulatory Visit (HOSPITAL_COMMUNITY): Payer: Self-pay | Admitting: Pulmonary Disease

## 2012-07-08 ENCOUNTER — Ambulatory Visit (HOSPITAL_COMMUNITY)
Admission: RE | Admit: 2012-07-08 | Discharge: 2012-07-08 | Disposition: A | Discharge status: Home / Self Care | Payer: Medicare Other | Source: Ambulatory Visit | Attending: Pulmonary Disease | Admitting: Pulmonary Disease

## 2012-07-08 DIAGNOSIS — M79609 Pain in unspecified limb: Secondary | ICD-10-CM | POA: Insufficient documentation

## 2012-07-08 DIAGNOSIS — I739 Peripheral vascular disease, unspecified: Secondary | ICD-10-CM | POA: Insufficient documentation

## 2012-07-08 DIAGNOSIS — C50919 Malignant neoplasm of unspecified site of unspecified female breast: Secondary | ICD-10-CM | POA: Diagnosis not present

## 2012-07-12 ENCOUNTER — Encounter: Payer: Self-pay | Admitting: *Deleted

## 2012-07-12 ENCOUNTER — Encounter: Payer: Self-pay | Admitting: Cardiology

## 2012-07-12 ENCOUNTER — Ambulatory Visit (INDEPENDENT_AMBULATORY_CARE_PROVIDER_SITE_OTHER): Payer: Medicare Other | Admitting: Cardiology

## 2012-07-12 VITALS — BP 130/76 | HR 74 | Ht 61.0 in | Wt 164.0 lb

## 2012-07-12 DIAGNOSIS — R079 Chest pain, unspecified: Secondary | ICD-10-CM

## 2012-07-12 DIAGNOSIS — K219 Gastro-esophageal reflux disease without esophagitis: Secondary | ICD-10-CM

## 2012-07-12 DIAGNOSIS — K5792 Diverticulitis of intestine, part unspecified, without perforation or abscess without bleeding: Secondary | ICD-10-CM | POA: Insufficient documentation

## 2012-07-12 DIAGNOSIS — J45909 Unspecified asthma, uncomplicated: Secondary | ICD-10-CM

## 2012-07-12 DIAGNOSIS — R0789 Other chest pain: Secondary | ICD-10-CM

## 2012-07-12 DIAGNOSIS — N200 Calculus of kidney: Secondary | ICD-10-CM | POA: Insufficient documentation

## 2012-07-12 NOTE — Patient Instructions (Addendum)
Your physician recommends that you schedule a follow-up appointment in: As needed   Your physician has requested that you have a stress echocardiogram. For further information please visit [email or web address]. Please follow instruction sheet as given.    

## 2012-07-12 NOTE — Progress Notes (Deleted)
Name: Betty Nguyen    DOB: 1940/10/29  Age: 72 y.o.  MR#: [id number]       PCP:  Fredirick Maudlin, MD      Insurance: Payor: MEDICARE  Plan: MEDICARE PART A AND B  Product Type: *No Product type*    CC:   PT ADVISED SHE HAS BEEN ON MULTIPLE KINDS OF ABT THERAPY RECENTLY FOR UTI, ETC, AS WELL AS METHYLPREDNISOLONE 4MG , PT NOTES THE WEAKNESS AND HEART FLUTTER/CHEST TIGHTNESS STARTED AT THE SAME TIME AS WELL AS NOTES SHE TRIED TO GO OUT DANCING EVEN WITH HER ASTHMA AND WEAKNESS AND WAS NOT SURE IF THIS CONTRIBUTED TO HER SXS AS WELL, HOWEVER MD HAWKINS STILL WANTED EVALUATION FROM CARDS     VS Filed Vitals:   07/12/12 1055  BP: 130/76  Pulse: 74  Height: 5\' 1"  (1.549 m)  Weight: 164 lb (74.39 kg)  SpO2: 97%    Weights Current Weight  07/12/12 164 lb (74.39 kg)  08/17/11 155 lb (70.308 kg)    Blood Pressure  BP Readings from Last 3 Encounters:  07/12/12 130/76  08/17/11 150/81     Admit date:  (Not on file) Last encounter with RMR:  Visit date not found   Allergy Codeine  Current Outpatient Prescriptions  Medication Sig Dispense Refill  . ALPRAZolam (XANAX) 0.5 MG tablet Take 0.5 mg by mouth at bedtime as needed for sleep.      Marland Kitchen anastrozole (ARIMIDEX) 1 MG tablet Take 1 mg by mouth daily.      . cetirizine (ZYRTEC) 10 MG tablet Take 10 mg by mouth daily.      . ciclesonide (ALVESCO) 160 MCG/ACT inhaler Inhale 1 puff into the lungs 2 (two) times daily.      . valACYclovir (VALTREX) 500 MG tablet Take 500 mg by mouth 2 (two) times daily.       No current facility-administered medications for this visit.    Discontinued Meds:   There are no discontinued medications.  Patient Active Problem List  Diagnosis  . Gastroesophageal reflux  . Nephrolithiasis  . Diverticulitis  . Breast carcinoma  . Asthma  . Chest pain    LABS    Component Value Date/Time   NA 139 08/27/2009 0932   NA 139 02/08/2007 0535   NA 141 02/07/2007 0535   K 4.0 08/27/2009 0932   K 4.1 02/08/2007  0535   K 4.0 02/07/2007 0535   CL 104 08/27/2009 0932   CL 106 02/08/2007 0535   CL 103 02/07/2007 0535   CO2 28 08/27/2009 0932   CO2 29 02/08/2007 0535   CO2 28 02/07/2007 0535   GLUCOSE 112* 08/27/2009 0932   GLUCOSE 141* 02/08/2007 0535   GLUCOSE 123* 02/07/2007 0535   BUN 12 08/27/2009 0932   BUN 6 02/08/2007 0535   BUN 10 02/07/2007 0535   CREATININE 0.74 08/27/2009 0932   CREATININE 0.75 02/08/2007 0535   CREATININE 0.78 02/07/2007 0535   CALCIUM 9.7 08/27/2009 0932   CALCIUM 8.7 02/08/2007 0535   CALCIUM 9.0 02/07/2007 0535   GFRNONAA >60 08/27/2009 0932   GFRNONAA >60 02/08/2007 0535   GFRNONAA >60 02/07/2007 0535   GFRAA  Value: >60        The eGFR has been calculated using the MDRD equation. This calculation has not been validated in all clinical situations. eGFR's persistently <60 mL/min signify possible Chronic Kidney Disease. 08/27/2009 0932   GFRAA  Value: >60        The eGFR  has been calculated using the MDRD equation. This calculation has not been validated in all clinical 02/08/2007 0535   GFRAA  Value: >60        The eGFR has been calculated using the MDRD equation. This calculation has not been validated in all clinical 02/07/2007 0535   CMP     Component Value Date/Time   NA 139 08/27/2009 0932   K 4.0 08/27/2009 0932   CL 104 08/27/2009 0932   CO2 28 08/27/2009 0932   GLUCOSE 112* 08/27/2009 0932   BUN 12 08/27/2009 0932   CREATININE 0.74 08/27/2009 0932   CALCIUM 9.7 08/27/2009 0932   PROT 7.3 08/27/2009 0932   ALBUMIN 3.9 08/27/2009 0932   AST 19 08/27/2009 0932   ALT 19 08/27/2009 0932   ALKPHOS 57 08/27/2009 0932   BILITOT 1.6* 08/27/2009 0932   GFRNONAA >60 08/27/2009 0932   GFRAA  Value: >60        The eGFR has been calculated using the MDRD equation. This calculation has not been validated in all clinical situations. eGFR's persistently <60 mL/min signify possible Chronic Kidney Disease. 08/27/2009 0932       Component Value Date/Time   WBC 6.8 08/27/2009 0932   WBC 6.0 02/08/2007 0535    WBC 7.6 02/07/2007 0535   HGB 13.5 08/27/2009 0932   HGB 11.4* 02/08/2007 0535   HGB 13.2 02/07/2007 0535   HCT 38.2 08/27/2009 0932   HCT 33.8* 02/08/2007 0535   HCT 39.0 02/07/2007 0535   MCV 90.1 08/27/2009 0932   MCV 89.2 02/08/2007 0535   MCV 89.0 02/07/2007 0535    Lipid Panel  No results found for this basename: chol, trig, hdl, cholhdl, vldl, ldlcalc    ABG No results found for this basename: phart, pco2, pco2art, po2, po2art, hco3, tco2, acidbasedef, o2sat     No results found for this basename: TSH   BNP (last 3 results) No results found for this basename: PROBNP,  in the last 8760 hours Cardiac Panel (last 3 results) No results found for this basename: CKTOTAL, CKMB, TROPONINI, RELINDX,  in the last 72 hours  Iron/TIBC/Ferritin No results found for this basename: iron, tibc, ferritin     EKG No orders found for this or any previous visit.   Prior Assessment and Plan Problem List as of 07/12/2012     ICD-9-CM   Gastroesophageal reflux   Nephrolithiasis   Diverticulitis   Breast carcinoma   Asthma   Chest pain       Imaging: US Arterial Seg Single  07/08/2012  *RADIOLOGY REPORT*  Clinical Data: Breast carcinoma.  Left lower extremity pain and claudication.  ULTRASOUND ANKLE/BRACHIAL INDICES BILATERAL  Comparison: None.  Findings: At rest, the right ABI is 1.23, left 1.16.  Triphasic wave forms are recorded distally in bilateral lower extremities.  IMPRESSION:  No evidence of hemodynamically significant lower extremity arterial occlusive disease at rest.   Original Report Authenticated By: D. Andria Rhein, MD

## 2012-07-14 ENCOUNTER — Encounter: Payer: Self-pay | Admitting: Cardiology

## 2012-07-14 NOTE — Assessment & Plan Note (Signed)
Respiratory status is currently good; pulmonary exam is unremarkable.

## 2012-07-14 NOTE — Progress Notes (Signed)
Patient ID: Betty Nguyen, female   DOB: [birth year], 72 y.o.   MRN: [id number] HPI: Initial Cardiology evaluation for this very nice woman referred by Fredirick Maudlin, MD for assessment of chest discomfort, dyspnea and palpitations. Symptoms date to approximately 3 weeks ago when she noted chest pressure accompanied by sensation in both shoulders, dyspnea both at rest, with exertion and associated with her chest discomfort and intermittent palpitations unassociated with dizziness or syncope.  At the time, she was receiving antibiotics for possible recurrent diverticulitis. She was subsequently treated with prednisone for URI symptoms. Since recovering from that illness and stopping those medications, symptoms have resolved. She notes that she is a very active woman and pursues her usual activities without any problems whatsoever. Current Outpatient Prescriptions  Medication Sig Dispense Refill  . ALPRAZolam (XANAX) 0.5 MG tablet Take 0.5 mg by mouth at bedtime as needed for sleep.      Marland Kitchen anastrozole (ARIMIDEX) 1 MG tablet Take 1 mg by mouth daily.      . cetirizine (ZYRTEC) 10 MG tablet Take 10 mg by mouth daily.      . ciclesonide (ALVESCO) 160 MCG/ACT inhaler Inhale 1 puff into the lungs 2 (two) times daily.      . valACYclovir (VALTREX) 500 MG tablet Take 500 mg by mouth 2 (two) times daily.       No current facility-administered medications for this visit.   Allergies  Allergen Reactions  . Codeine     Past Medical History  Diagnosis Date  . Breast carcinoma 08/2009    Invasive ductal; left; lumpectomy and sentinel node excision  . Asthma   . Nephrolithiasis   . Gastroesophageal reflux   . Diverticulitis 2012    Treated medically  . Chest pain     Past Surgical History  Procedure Laterality Date  . Cesarean section    . Tubal ligation    . Kidney stone surgery    . Dilation and curettage of uterus    . Colonoscopy  2004    History reviewed. No pertinent family history.  History    Social History  . Marital Status: Legally Separated    Spouse Name: N/A    Number of Children: N/A  . Years of Education: N/A   Occupational History  . Not on file.   Social History Main Topics  . Smoking status: Never Smoker   . Smokeless tobacco: Not on file  . Alcohol Use: No  . Drug Use: No  . Sexually Active:    Other Topics Concern  . Not on file   Social History Narrative  . No narrative on file    ROS: Denies current cardiopulmonary symptoms.. She has had intermittent left calf pain without any history of DVT. She has noted a recent weight gain, has mild intermittent vertigo, a history of diverticulitis requiring hospitalization which resolved with intravenous antibiotic therapy, colonoscopy in 2005 requiring polypectomy x1, left shoulder discomfort with lifting and intermittent depression and anxiety. All other systems reviewed and are negative.  PHYSICAL EXAM: BP 130/76  Pulse 74  Ht 5\' 1"  (1.549 m)  Wt 74.39 kg (164 lb)  BMI 31 kg/m2  SpO2 97%;  Body mass index is 31 kg/(m^2).   General-Well-developed; no acute distress Body Habitus-mildly overweight HEENT-[place]/AT; PERRL; EOM intact; conjunctiva and lids nl Neck-No JVD; no carotid bruits Endocrine-No thyromegaly Lungs-Clear lung fields; resonant percussion; normal I-to-E ratio Cardiovascular- normal PMI; normal S1 and S2; S4 present Abdomen-BS normal; soft and non-tender without  masses or organomegaly Musculoskeletal-No deformities, cyanosis or clubbing Neurologic-Nl cranial nerves; symmetric strength and tone Skin- Warm, no significant lesions Extremities-Nl distal pulses; no edema  EKG:  Tracing performed 03/30/13 obtained and reviewed. Normal sinus rhythm; borderline short PR interval; possible left atrial abnormality; otherwise normal. No previous tracing for comparison.   Asbury Bing, MD 07/14/2012  10:58 AM  ASSESSMENT AND PLAN

## 2012-07-14 NOTE — Assessment & Plan Note (Addendum)
Symptoms were worrisome at onset, but their resolution spontaneously over short period of time, a normal physical exam and normal EKG are reassuring. We will proceed with a stress echocardiogram to exclude significant coronary artery disease and to assess left ventricular systolic function.  The most recent laboratory studies available to me date back 3 years. Dr. Juanetta Gosling may wish to obtain a CBC, chemistry profile, TSH, lipid profile and chest x-ray if not recently performed.

## 2012-07-14 NOTE — Assessment & Plan Note (Addendum)
Patient describes a history of mild to moderate symptoms of GERD. Recent problems could have been of GI origin, perhaps as a result of exacerbation of her underlying condition by treatment with prednisone. At this point, she is not interested in referral to a gastroenterologist.

## 2012-07-19 ENCOUNTER — Telehealth: Payer: Self-pay | Admitting: Cardiology

## 2012-07-19 NOTE — Telephone Encounter (Signed)
Patient not on medication for sugars.  Advised her to bing snack and she would be monitored closely during the testing.

## 2012-07-19 NOTE — Telephone Encounter (Signed)
Message left for a return call

## 2012-07-19 NOTE — Telephone Encounter (Signed)
Patient states she is having a treadmill tomorrow, has concerns regarding her blood sugar dropping and going low.  She states she is borderline diabetic.

## 2012-07-20 ENCOUNTER — Ambulatory Visit (HOSPITAL_COMMUNITY)
Admission: RE | Admit: 2012-07-20 | Discharge: 2012-07-20 | Disposition: A | Discharge status: Home / Self Care | Payer: Medicare Other | Source: Ambulatory Visit | Attending: Cardiology | Admitting: Cardiology

## 2012-07-20 DIAGNOSIS — R0789 Other chest pain: Secondary | ICD-10-CM

## 2012-07-26 DIAGNOSIS — A419 Sepsis, unspecified organism: Secondary | ICD-10-CM | POA: Diagnosis not present

## 2012-07-26 DIAGNOSIS — M79609 Pain in unspecified limb: Secondary | ICD-10-CM | POA: Diagnosis not present

## 2012-07-28 ENCOUNTER — Encounter (HOSPITAL_COMMUNITY): Payer: Self-pay | Admitting: Cardiology

## 2012-07-28 ENCOUNTER — Ambulatory Visit (HOSPITAL_COMMUNITY)
Admission: RE | Admit: 2012-07-28 | Discharge: 2012-07-28 | Disposition: A | Discharge status: Home / Self Care | Payer: Medicare Other | Source: Ambulatory Visit | Attending: Cardiology | Admitting: Cardiology

## 2012-07-28 DIAGNOSIS — R0989 Other specified symptoms and signs involving the circulatory and respiratory systems: Secondary | ICD-10-CM | POA: Insufficient documentation

## 2012-07-28 DIAGNOSIS — Z853 Personal history of malignant neoplasm of breast: Secondary | ICD-10-CM | POA: Diagnosis not present

## 2012-07-28 DIAGNOSIS — R0609 Other forms of dyspnea: Secondary | ICD-10-CM | POA: Insufficient documentation

## 2012-07-28 DIAGNOSIS — R079 Chest pain, unspecified: Secondary | ICD-10-CM | POA: Diagnosis not present

## 2012-07-28 NOTE — Progress Notes (Signed)
*  PRELIMINARY RESULTS* Echocardiogram Echocardiogram Stress Test has been performed.  Betty Nguyen 07/28/2012, 11:09 AM

## 2012-07-28 NOTE — Progress Notes (Signed)
Stress Lab Nurses Notes - [facility]  ZENIA GUEST 07/28/2012 Reason for doing test: Chest Pain and Dyspnea Type of test: Stress Echo Nurse performing test: Parke Poisson, RN Nuclear Medicine Tech: Not Applicable Echo Tech: Karrie Doffing MD performing test: R. Dietrich Pates Family MD: Dr. Juanetta Gosling Test explained and consent signed: yes IV started: No IV started Symptoms: SOB Treatment/Intervention: None Reason test stopped: reached target HR After recovery IV was: NA Patient to return to Nuc. Med at : NA Patient discharged: Home Patient's Condition upon discharge was: stable Comments: During test peak BP 181/76 & HR 153.  Recovery BP 145/86  & HR 88 .  Symptoms resolved in recovery. Erskine Speed T

## 2012-08-18 DIAGNOSIS — E785 Hyperlipidemia, unspecified: Secondary | ICD-10-CM | POA: Diagnosis not present

## 2012-08-18 DIAGNOSIS — E109 Type 1 diabetes mellitus without complications: Secondary | ICD-10-CM | POA: Diagnosis not present

## 2012-08-18 DIAGNOSIS — E669 Obesity, unspecified: Secondary | ICD-10-CM | POA: Diagnosis not present

## 2012-08-18 DIAGNOSIS — M545 Low back pain: Secondary | ICD-10-CM | POA: Diagnosis not present

## 2012-09-01 ENCOUNTER — Ambulatory Visit (HOSPITAL_COMMUNITY)
Admission: RE | Admit: 2012-09-01 | Discharge: 2012-09-01 | Disposition: A | Discharge status: Home / Self Care | Payer: Medicare Other | Source: Ambulatory Visit | Attending: Hematology and Oncology | Admitting: Hematology and Oncology

## 2012-09-01 DIAGNOSIS — Z853 Personal history of malignant neoplasm of breast: Secondary | ICD-10-CM | POA: Insufficient documentation

## 2012-09-01 DIAGNOSIS — Z09 Encounter for follow-up examination after completed treatment for conditions other than malignant neoplasm: Secondary | ICD-10-CM

## 2012-09-01 DIAGNOSIS — R928 Other abnormal and inconclusive findings on diagnostic imaging of breast: Secondary | ICD-10-CM | POA: Diagnosis not present

## 2012-09-08 ENCOUNTER — Ambulatory Visit (HOSPITAL_COMMUNITY)
Admission: RE | Admit: 2012-09-08 | Discharge: 2012-09-08 | Disposition: A | Discharge status: Home / Self Care | Payer: Medicare Other | Source: Ambulatory Visit | Attending: Pulmonary Disease | Admitting: Pulmonary Disease

## 2012-09-08 ENCOUNTER — Other Ambulatory Visit (HOSPITAL_COMMUNITY): Payer: Self-pay | Admitting: Pulmonary Disease

## 2012-09-08 DIAGNOSIS — M79605 Pain in left leg: Secondary | ICD-10-CM

## 2012-09-08 DIAGNOSIS — J449 Chronic obstructive pulmonary disease, unspecified: Secondary | ICD-10-CM | POA: Diagnosis not present

## 2012-09-08 DIAGNOSIS — M79609 Pain in unspecified limb: Secondary | ICD-10-CM | POA: Insufficient documentation

## 2012-09-08 DIAGNOSIS — M25569 Pain in unspecified knee: Secondary | ICD-10-CM | POA: Diagnosis not present

## 2012-09-08 DIAGNOSIS — C50919 Malignant neoplasm of unspecified site of unspecified female breast: Secondary | ICD-10-CM | POA: Diagnosis not present

## 2012-09-08 DIAGNOSIS — E109 Type 1 diabetes mellitus without complications: Secondary | ICD-10-CM | POA: Diagnosis not present

## 2012-09-09 DIAGNOSIS — J45909 Unspecified asthma, uncomplicated: Secondary | ICD-10-CM | POA: Diagnosis not present

## 2012-09-09 DIAGNOSIS — M949 Disorder of cartilage, unspecified: Secondary | ICD-10-CM | POA: Diagnosis not present

## 2012-09-09 DIAGNOSIS — C50919 Malignant neoplasm of unspecified site of unspecified female breast: Secondary | ICD-10-CM | POA: Diagnosis not present

## 2012-09-09 DIAGNOSIS — M899 Disorder of bone, unspecified: Secondary | ICD-10-CM

## 2012-09-09 DIAGNOSIS — C50419 Malignant neoplasm of upper-outer quadrant of unspecified female breast: Secondary | ICD-10-CM | POA: Diagnosis not present

## 2012-09-30 ENCOUNTER — Ambulatory Visit (INDEPENDENT_AMBULATORY_CARE_PROVIDER_SITE_OTHER): Payer: Medicare Other | Admitting: Orthopedic Surgery

## 2012-09-30 VITALS — BP 110/60 | Ht 61.0 in | Wt 168.0 lb

## 2012-09-30 DIAGNOSIS — M79662 Pain in left lower leg: Secondary | ICD-10-CM

## 2012-09-30 DIAGNOSIS — M79609 Pain in unspecified limb: Secondary | ICD-10-CM | POA: Diagnosis not present

## 2012-09-30 NOTE — Patient Instructions (Addendum)
Call to arrange therapy 

## 2012-10-02 ENCOUNTER — Encounter: Payer: Self-pay | Admitting: Orthopedic Surgery

## 2012-10-02 DIAGNOSIS — M79669 Pain in unspecified lower leg: Secondary | ICD-10-CM | POA: Insufficient documentation

## 2012-10-02 NOTE — Progress Notes (Signed)
Patient ID: Betty Nguyen, female   DOB: [birth year], 72 y.o.   MRN: [id number] Chief Complaint  Patient presents with  . Leg Pain    Left leg pain, no injury Referred by Dr. Juanetta Gosling    This patient presents with unexplained left calf pain negative ultrasound no trauma pain in the calf primarily comes and goes it's episodic and may be associated with some muscle spasms. There is no radicular symptoms. No numbness no tingling. No claudication.  Review of systems as recorded by the patient and reviewed  The past, family history and social history have been reviewed and are recorded above   Vital signs are stable as recorded  General appearance is normal  The patient is alert and oriented x3  The patient's mood and affect are normal  Gait assessment: No problems with ambulation no limp no cane  The cardiovascular exam reveals normal pulses and temperature without edema or  swelling.  The lymphatic system is negative for palpable lymph nodes  The sensory exam is normal.  There are no pathologic reflexes.  Balance is normal.   Exam of the left calf Inspection tenderness is noted in the medial calf muscle no swelling Range of motion full range of motion knee and ankle Stability knee stable ankle stable Strength muscle tone normal no atrophy Skin skin no lacerations or rash Negative straight leg raise no back tenderness  Unexplained calf pain  Recommend ultrasound treatment Followup 6 weeks reassess

## 2012-10-26 DIAGNOSIS — Z23 Encounter for immunization: Secondary | ICD-10-CM | POA: Diagnosis not present

## 2012-11-01 ENCOUNTER — Ambulatory Visit (HOSPITAL_COMMUNITY)
Admission: RE | Admit: 2012-11-01 | Discharge: 2012-11-01 | Disposition: A | Discharge status: Home / Self Care | Payer: Medicare Other | Source: Ambulatory Visit | Attending: Orthopedic Surgery | Admitting: Orthopedic Surgery

## 2012-11-01 DIAGNOSIS — M79609 Pain in unspecified limb: Secondary | ICD-10-CM | POA: Diagnosis not present

## 2012-11-01 DIAGNOSIS — IMO0001 Reserved for inherently not codable concepts without codable children: Secondary | ICD-10-CM | POA: Insufficient documentation

## 2012-11-01 DIAGNOSIS — M6281 Muscle weakness (generalized): Secondary | ICD-10-CM | POA: Insufficient documentation

## 2012-11-01 DIAGNOSIS — E119 Type 2 diabetes mellitus without complications: Secondary | ICD-10-CM | POA: Diagnosis not present

## 2012-11-01 NOTE — Evaluation (Signed)
Physical Therapy Evaluation  Patient Details  Name: Betty Nguyen MRN: [id number] Date of Birth: 1940/10/28  Today's Date: 11/01/2012 Time: 1102-1201 PT Time Calculation (min): 59 min Charges: 1 evaluation              Visit#: 1 of 12  Re-eval: 12/01/12 Assessment Diagnosis: Lt calf pain Next MD Visit: Dr. Romeo Apple  - 11/11/12  Authorization: Medicare    Authorization Time Period:    Authorization Visit#: 1 of 10   Past Medical History:  Past Medical History  Diagnosis Date  . Breast carcinoma 08/2009    Invasive ductal; left; lumpectomy and sentinel node excision  . Asthma   . Nephrolithiasis   . Gastroesophageal reflux   . Diverticulitis 2012    Treated medically  . Chest pain   . Anxiety and depression   . Hyperlipidemia   . Diabetes mellitus type II, controlled    Past Surgical History:  Past Surgical History  Procedure Laterality Date  . Cesarean section    . Tubal ligation    . Nephrolithotomy    . Dilation and curettage of uterus    . Colonoscopy  2004  . Breast lumpectomy      Left    Subjective Symptoms/Limitations Pertinent History: Pt is a 72 year old active female referred to PT for calf pain that started about a year ago which started out dull and achy.  Her c/co is of Rt calf pain especially when dancing and is scared of falling, she reports increased cramping to her Bil quadriceps, has pain to her low back, pain to her Lt hip.  She has been using a freinds TENS unit for the past three weeks which has helped some. She reports that she has had cramping in her calfs  Limitations: Walking;Standing (Dancing) How long can you walk comfortably?: less than a 1/2 mile Pain Assessment Currently in Pain?: Yes Pain Score: 3  (Pain Range: 3-10/10) Pain Location: Calf Pain Orientation: Left Pain Type: Acute pain;Chronic pain Pain Onset: More than a month ago Pain Frequency: Constant Pain Relieving Factors: TENS unit, pain medication  Effect of Pain on Daily  Activities: difficulty dancing.   Precautions/Restrictions     Balance Screening Balance Screen Has the patient fallen in the past 6 months: Yes How many times?: 0 (fell on Lt side 3x in life) Has the patient had a decrease in activity level because of a fear of falling? : Yes Is the patient reluctant to leave their home because of a fear of falling? : No  Prior Functioning  Prior Function Driving: Yes Comments: Enjoys dancing   Cognition/Observation    Sensation/Coordination/Flexibility/Functional Tests Functional Tests Functional Tests: Lower Extremity Functional Scale (LEFS): 33/80  Assessment RLE Strength Right Hip Flexion: 3+/5 Right Hip Extension: 3/5 Right Hip ABduction: 3/5 Right Knee Flexion: 4/5 Right Knee Extension: 4/5 LLE AROM (degrees) Left Ankle Dorsiflexion: -2 Left Ankle Plantar Flexion: 40 Left Ankle Inversion: 40 Left Ankle Eversion: 30 LLE Strength Left Hip Flexion: 3/5 Left Hip Extension: 3/5 Left Hip ABduction: 3/5 Left Knee Flexion: 4/5 Left Knee Extension: 4/5 Left Ankle Dorsiflexion: 3+/5 Left Ankle Plantar Flexion: 3+/5 Left Ankle Inversion: 3+/5 Left Ankle Eversion: 3+/5 Palpation Palpation: significant fascial restrictions and muscle spasms to BLE calfs, hamstrings and gluteal region with pain and tenderness reported.   Mobility/Balance  Ambulation/Gait Ambulation/Gait: Yes Gait Pattern: Antalgic;Left foot flat Static Standing Balance Static Standing - Comment/# of Minutes: impaired hip strategy with tandem stance Single Leg Stance - Right  Leg: 5 Single Leg Stance - Left Leg: 5 Tandem Stance - Right Leg: 10 Tandem Stance - Left Leg: 10 Rhomberg - Eyes Opened: 10 Rhomberg - Eyes Closed: 10     Physical Therapy Assessment and Plan PT Assessment and Plan Clinical Impression Statement: Pt is a 72 year old female referred to PT for Lt calf pain with impaiments listed below, and has a hx of significant cramping to BLE and eye  twitching to Lt eye.  Overall Lt and Rt LE strength in 3-3+/5 with more weakness at night time.  Discussed with patient role of PT and will need f/u w/PCP if symptoms are not resolving. Pt will benefit from skilled therapeutic intervention in order to improve on the following deficits: Abnormal gait;Decreased balance;Pain;Decreased strength;Increased muscle spasms;Impaired perceived functional ability;Decreased range of motion PT Frequency: Min 3X/week PT Duration:  (3 weeks) PT Treatment/Interventions: Gait training;Stair training;Therapeutic activities;Therapeutic exercise;Balance training;Neuromuscular re-education;Patient/family education;Manual techniques;Modalities PT Plan: Ultrasound to calf, manual techniques to decrease muscle spasms, demonstrate 4 way SLR, gastroc/solues and hamstring stretch, LAQ, bridging, heel/toe raises and squats.  Continue to progress hip and core strengthening and improve ankle DF.     Goals Home Exercise Program Pt/caregiver will Perform Home Exercise Program: independently;with written HEP provided PT Goal: Perform Home Exercise Program - Progress: Goal set today PT Short Term Goals Time to Complete Short Term Goals: 3 weeks PT Short Term Goal 1: Pt will report pain less than a 2/10 for 25% of her day to be able to dance with greater ease.  PT Short Term Goal 2: Pt will present with minimal fascial restrictions to her BLE gastroc, solues, hamstrings and gluteal region for iimproved QOL. PT Short Term Goal 3: Pt willimprove proprioceptive awareness and demonstreate Rt and Lt SLS x15 seconds on BLE. PT Short Term Goal 4: Pt will improve BLE strength to WNL in order to ambulate for greater than 1 mile to conitnue with exercise program.  PT Short Term Goal 5: Pt will improve her DF to 5 degrees for improved gait mechanics.  Additional PT Short Term Goals?: Yes PT Short Term Goal 6: Pt will improve her LEFS to greater than 50/80 for improved percieved functional  ability.   Problem List Patient Active Problem List   Diagnosis Date Noted  . Muscle weakness 11/01/2012  . Calf pain 10/02/2012  . Asthma 07/12/2012  . Gastroesophageal reflux   . Nephrolithiasis   . Diverticulitis   . Chest pain   . Breast carcinoma 08/26/2009    PT Plan of Care PT Home Exercise Plan: see scanned report PT Patient Instructions: answered questions about diagnosis, discussed POC Consulted and Agree with Plan of Care: Patient  GP Functional Assessment Tool Used: LEFS Functional Limitation: Mobility: Walking and moving around Mobility: Walking and Moving Around Current Status ([id number]): At least 20 percent but less than 40 percent impaired, limited or restricted Mobility: Walking and Moving Around Goal Status [phone]): At least 1 percent but less than 20 percent impaired, limited or restricted  Wava Kildow, MPT, ATC 11/01/2012, 12:27 PM  Physician Documentation Your signature is required to indicate approval of the treatment plan as stated above.  Please sign and either send electronically or make a copy of this report for your files and return this physician signed original.   Please mark one 1.__approve of plan  2. ___approve of plan with the following conditions.   ______________________________  _____________________ Physician Signature                                                                                                             Date

## 2012-11-03 ENCOUNTER — Ambulatory Visit (HOSPITAL_COMMUNITY)
Admission: RE | Admit: 2012-11-03 | Discharge: 2012-11-03 | Disposition: A | Discharge status: Home / Self Care | Payer: Medicare Other | Source: Ambulatory Visit | Attending: Orthopedic Surgery | Admitting: Orthopedic Surgery

## 2012-11-03 DIAGNOSIS — M79662 Pain in left lower leg: Secondary | ICD-10-CM

## 2012-11-03 DIAGNOSIS — M6281 Muscle weakness (generalized): Secondary | ICD-10-CM

## 2012-11-03 NOTE — Progress Notes (Addendum)
Physical Therapy Treatment Patient Details  Name: Betty Nguyen MRN: [id number] Date of Birth: 07-Nov-1940  Today's Date: 11/03/2012 Time: 4098-1191 PT Time Calculation (min): 43 min  Visit#: 2 of 12  Re-eval: 12/01/12 Charges: Therex x 25'(1102-1127) Manual x 15'(1130-1145)  Authorization: Medicare  Authorization Visit#: 2 of 10   Subjective: Symptoms/Limitations Symptoms: Pt states that her pain with walking increaes to 7/10. Pain Assessment Currently in Pain?: Yes Pain Score: 7  (with ambulation) Pain Location: Calf Pain Orientation: Left   Exercise/Treatments Ankle Stretches Slant Board Stretch: 2 reps;30 seconds Ankle Exercises - Standing Heel Raises: 10 reps Toe Raise: 10 reps Other Standing Ankle Exercises: Functional squats x 10 Ankle Exercises - Supine Other Supine Ankle Exercises: SLR x 10 bialteral Other Supine Ankle Exercises: Hip ext in prone x 10 bilateral Ankle Exercises - Sidelying Other Sidelying Ankle Exercises: Hip and/add x 10 bilateral  Manual Therapy: Myofascial release  Myofascial Release: Left gastroc and soleus to decrease fascial restrictions   Physical Therapy Assessment and Plan PT Assessment and Plan Clinical Impression Statement: Pt completes therex well after initial cueing and demo. Ultrasound not completed secondary to hx of cancer. Also, advised pt to discontinue TENS use at home for this reason. Manual completed to left calf to decrease pain and fascial restrictions. Pt will benefit from skilled therapeutic intervention in order to improve on the following deficits: Abnormal gait;Decreased balance;Pain;Decreased strength;Increased muscle spasms;Impaired perceived functional ability;Decreased range of motion PT Frequency: Min 3X/week PT Duration:  (3 weeks) PT Treatment/Interventions: Gait training;Stair training;Therapeutic activities;Therapeutic exercise;Balance training;Neuromuscular re-education;Patient/family education;Manual  techniques;Modalities PT Plan: Continue manual techniques to decrease muscle spasms, demonstrate 4 way SLR, gastroc/solues and hamstring stretch, LAQ, bridging, heel/toe raises and squats.  Continue to progress hip and core strengthening and improve ankle DF.      Problem List Patient Active Problem List   Diagnosis Date Noted  . Muscle weakness 11/01/2012  . Calf pain 10/02/2012  . Asthma 07/12/2012  . Gastroesophageal reflux   . Nephrolithiasis   . Diverticulitis   . Chest pain   . Breast carcinoma 08/26/2009    GP Functional Assessment Tool Used: LEFS  Seth Bake, PTA  11/03/2012, 1:51 PM

## 2012-11-05 ENCOUNTER — Ambulatory Visit (HOSPITAL_COMMUNITY): Payer: Medicare Other | Admitting: Physical Therapy

## 2012-11-08 ENCOUNTER — Ambulatory Visit (HOSPITAL_COMMUNITY)
Admission: RE | Admit: 2012-11-08 | Discharge: 2012-11-08 | Disposition: A | Discharge status: Home / Self Care | Payer: Medicare Other | Source: Ambulatory Visit | Attending: Orthopedic Surgery | Admitting: Orthopedic Surgery

## 2012-11-08 DIAGNOSIS — M79662 Pain in left lower leg: Secondary | ICD-10-CM

## 2012-11-08 DIAGNOSIS — M6281 Muscle weakness (generalized): Secondary | ICD-10-CM

## 2012-11-08 NOTE — Progress Notes (Signed)
Physical Therapy Treatment Patient Details  Name: Betty Nguyen MRN: [id number] Date of Birth: 11-27-1940  Today's Date: 11/08/2012 Time: 0102-7253 PT Time Calculation (min): 30 min Charges: Manual: 1105-1125 TE: 6644-0347 Visit#: 3 of 12  Re-eval: 12/01/12    Authorization: Medicare  Authorization Time Period:    Authorization Visit#: 2 of 10   Subjective: Symptoms/Limitations Symptoms: Pt reports that she was able to go dancing on saturday and it didn't hurt as bad. My right one is beginning to hurt just like the left.  Pain Assessment Currently in Pain?: Yes Pain Score: 2  Pain Location: Calf  Exercise/Treatments Ankle Stretches Soleus Stretch: 2 reps;30 seconds (BLE) Slant Board Stretch: 3 reps;30 seconds Ankle Exercises - Sidelying Other Sidelying Ankle Exercises: Hip and/add x 10 bilateral Manual Therapy Manual Therapy: Myofascial release Myofascial Release: Bil gastroc and solues to decrease fascial restrictions and decreae pain with STM afterwards.   Physical Therapy Assessment and Plan PT Assessment and Plan Clinical Impression Statement: Pt has signifiicant reduction in fascial restrictions to BLE after manual techniques.  Pt able to demonstrate hip exercises independently.  Encouraged pt to continue with hip strengthening at home and educated on proper stretching for home.  Pt will benefit from skilled therapeutic intervention in order to improve on the following deficits: Abnormal gait;Decreased balance;Pain;Decreased strength;Increased muscle spasms;Impaired perceived functional ability;Decreased range of motion PT Frequency: Min 3X/week PT Duration:  (3 weeks) PT Treatment/Interventions: Gait training;Stair training;Therapeutic activities;Therapeutic exercise;Balance training;Neuromuscular re-education;Patient/family education;Manual techniques;Modalities PT Plan: Continue manual techniques to decrease muscle spasms, demonstrate 4 way SLR, gastroc/solues and  hamstring stretch, LAQ, bridging, heel/toe raises and squats.  Continue to progress hip and core strengthening and improve ankle DF.     Goals Home Exercise Program PT Goal: Perform Home Exercise Program - Progress: Met PT Short Term Goals Time to Complete Short Term Goals: 3 weeks PT Short Term Goal 1: Pt will report pain less than a 2/10 for 25% of her day to be able to dance with greater ease.  PT Short Term Goal 1 - Progress: Progressing toward goal PT Short Term Goal 2: Pt will present with minimal fascial restrictions to her BLE gastroc, solues, hamstrings and gluteal region for iimproved QOL. PT Short Term Goal 2 - Progress: Progressing toward goal PT Short Term Goal 3: Pt willimprove proprioceptive awareness and demonstreate Rt and Lt SLS x15 seconds on BLE. PT Short Term Goal 3 - Progress: Progressing toward goal PT Short Term Goal 4: Pt will improve BLE strength to WNL in order to ambulate for greater than 1 mile to conitnue with exercise program.  PT Short Term Goal 4 - Progress: Progressing toward goal PT Short Term Goal 5: Pt will improve her DF to 5 degrees for improved gait mechanics.  PT Short Term Goal 5 - Progress: Progressing toward goal Additional PT Short Term Goals?: Yes PT Short Term Goal 6: Pt will improve her LEFS to greater than 50/80 for improved percieved functional ability.  PT Short Term Goal 6 - Progress: Progressing toward goal  Problem List Patient Active Problem List   Diagnosis Date Noted  . Muscle weakness 11/01/2012  . Calf pain 10/02/2012  . Asthma 07/12/2012  . Gastroesophageal reflux   . Nephrolithiasis   . Diverticulitis   . Chest pain   . Breast carcinoma 08/26/2009   GP Functional Assessment Tool Used: LEFS  Betty Nguyen, MPT, ATC 11/08/2012, 11:51 AM

## 2012-11-10 ENCOUNTER — Ambulatory Visit (HOSPITAL_COMMUNITY)
Admission: RE | Admit: 2012-11-10 | Discharge: 2012-11-10 | Disposition: A | Discharge status: Home / Self Care | Payer: Medicare Other | Source: Ambulatory Visit | Attending: Pulmonary Disease | Admitting: Pulmonary Disease

## 2012-11-10 NOTE — Progress Notes (Signed)
Physical Therapy Treatment Patient Details  Name: Betty Nguyen MRN: [id number] Date of Birth: 25-Jan-1941  Today's Date: 11/10/2012 Time: 1102-1140 PT Time Calculation (min): 38 min  Visit#: 4 of 12  Re-eval: 12/01/12 Charges:  Self care x 8' (1102-1110) Manual x 15' (1110-1125) Therex x 15' (1125-1140)  Authorization: Medicare  Authorization Visit#: 4 of 10   Subjective: Symptoms/Limitations Symptoms: Pt states that she is pain free at rest. Pain increases to 71/0 at end of session. Pain Assessment Currently in Pain?: Yes Pain Score: 0-No pain (Increases in 7/10) Pain Location: Calf Pain Orientation: Left   Exercise/Treatments Ankle Exercises - Supine Other Supine Ankle Exercises: SLR x 10 bialteral Other Supine Ankle Exercises: Hip ext in prone x 10 bilateral Ankle Exercises - Sidelying Other Sidelying Ankle Exercises: Hip abd/add x 10 bilateral Manual Therapy Manual Therapy: Myofascial release Myofascial Release: Bil gastroc and solues to decrease fascial restrictions and decreae pain with STM afterwards.   Physical Therapy Assessment and Plan PT Assessment and Plan Clinical Impression Statement: Reviewed mat exercises for HEP. Pt requires min cuing intitally for form but is evenetually able to self correct. Fascial restricstion noted in bilateral calves. Pt reports decreased pain and tightness at end of session. Goals were reviewed this session, pt is progressing toawrd all. Pt will benefit from skilled therapeutic intervention in order to improve on the following deficits: Abnormal gait;Decreased balance;Pain;Decreased strength;Increased muscle spasms;Impaired perceived functional ability;Decreased range of motion PT Frequency: Min 3X/week PT Duration:  (3 weeks) PT Treatment/Interventions: Gait training;Stair training;Therapeutic activities;Therapeutic exercise;Balance training;Neuromuscular re-education;Patient/family education;Manual techniques;Modalities PT Plan:  Continue manual techniques to decrease muscle spasms, demonstrate 4 way SLR, gastroc/solues and hamstring stretch, LAQ, bridging, heel/toe raises and squats.  Continue to progress hip and core strengthening and improve ankle DF.     Goals PT Short Term Goals Time to Complete Short Term Goals: 3 weeks PT Short Term Goal 1: Pt will report pain less than a 2/10 for 25% of her day to be able to dance with greater ease.  PT Short Term Goal 1 - Progress: Progressing toward goal PT Short Term Goal 2: Pt will present with minimal fascial restrictions to her BLE gastroc, solues, hamstrings and gluteal region for iimproved QOL. PT Short Term Goal 2 - Progress: Progressing toward goal PT Short Term Goal 3: Pt will improve proprioceptive awareness and demonstreate Rt and Lt SLS x15 seconds on BLE. PT Short Term Goal 3 - Progress: Progressing toward goal PT Short Term Goal 4: Pt will improve BLE strength to WNL in order to ambulate for greater than 1 mile to conitnue with exercise program.  PT Short Term Goal 4 - Progress: Progressing toward goal PT Short Term Goal 5: Pt will improve her DF to 5 degrees for improved gait mechanics.  PT Short Term Goal 5 - Progress: Progressing toward goal PT Short Term Goal 6: Pt will improve her LEFS to greater than 50/80 for improved percieved functional ability.  PT Short Term Goal 6 - Progress: Progressing toward goal  Problem List Patient Active Problem List   Diagnosis Date Noted  . Muscle weakness 11/01/2012  . Calf pain 10/02/2012  . Asthma 07/12/2012  . Gastroesophageal reflux   . Nephrolithiasis   . Diverticulitis   . Chest pain   . Breast carcinoma 08/26/2009    General Behavior During Therapy: [facility] for tasks assessed/performed  Seth Bake, PTA 11/10/2012, 12:08 PM

## 2012-11-11 ENCOUNTER — Ambulatory Visit: Payer: Medicare Other | Admitting: Orthopedic Surgery

## 2012-11-12 ENCOUNTER — Ambulatory Visit (HOSPITAL_COMMUNITY)
Admission: RE | Admit: 2012-11-12 | Discharge: 2012-11-12 | Disposition: A | Discharge status: Home / Self Care | Payer: Medicare Other | Source: Ambulatory Visit | Attending: Pulmonary Disease | Admitting: Pulmonary Disease

## 2012-11-12 DIAGNOSIS — M6281 Muscle weakness (generalized): Secondary | ICD-10-CM

## 2012-11-12 DIAGNOSIS — M79662 Pain in left lower leg: Secondary | ICD-10-CM

## 2012-11-12 NOTE — Progress Notes (Signed)
Physical Therapy Treatment Patient Details  Name: Betty Nguyen MRN: [id number] Date of Birth: April 20, 1941  Today's Date: 11/12/2012 Time: 1308-6578 PT Time Calculation (min): 36 min Charges: Manual: 1105-1120 TE: 1120-1141 Visit#: 5 of 12  Re-eval: 12/01/12 Assessment Diagnosis: Lt calf pain Next MD Visit: Dr. Romeo Apple  - 12/09/12  Authorization: Medicare  Authorization Time Period:    Authorization Visit#: 4 of 10   Subjective: Symptoms/Limitations Symptoms: Pt reports that she is feeling better to her leg and feels that the tightness is decreasing. most of her day it is a 2/10.  Able to walk her ddog 1/2 mile with less pain  Pain Assessment Currently in Pain?: No/denies  Precautions/Restrictions     Exercise/Treatments Ankle Stretches Slant Board Stretch: 3 reps;30 seconds Aerobic Exercises Tread Mill: 10 min 1.2 mph at end of session Ankle Exercises - Standing Rocker Board: 1 minute Other Standing Ankle Exercises: Vector stance BLE 3x5 sec holds each direction   Manual Therapy Manual Therapy: Myofascial release Myofascial Release: Prone: Bil gastroc and solues to decrease fascial restrictions and decreae pain with STM afterwards  Physical Therapy Assessment and Plan PT Assessment and Plan Clinical Impression Statement: added vector stand and rocker borad to improve BLE function and TM at end of session to asses pain with walking activities.  Pt has 90% reduction in muscle spasms to LLE, and 60% to RLE overall.  Pt demonstrates improved gait mechancis to LLE compared to RLE which requires min cueing for knee extension and heel strike.  PT Frequency: Min 3X/week PT Duration:  (3 weeks) PT Treatment/Interventions: Gait training;Stair training;Therapeutic activities;Therapeutic exercise;Balance training;Neuromuscular re-education;Patient/family education;Manual techniques;Modalities PT Plan: Add heel/toe walking, tandem gait, retro gait to improve gait mechanics. Add  towel exercises to BLE when able.  Cont with manual as needed.     Goals Home Exercise Program Pt/caregiver will Perform Home Exercise Program: Independently PT Goal: Perform Home Exercise Program - Progress: Met PT Short Term Goals Time to Complete Short Term Goals: 3 weeks PT Short Term Goal 1: Pt will report pain less than a 2/10 for 25% of her day to be able to dance with greater ease.  PT Short Term Goal 1 - Progress: Met PT Short Term Goal 2: Pt will present with minimal fascial restrictions to her BLE gastroc, solues, hamstrings and gluteal region for iimproved QOL. PT Short Term Goal 2 - Progress: Met PT Short Term Goal 3: Pt will improve proprioceptive awareness and demonstreate Rt and Lt SLS x15 seconds on BLE. PT Short Term Goal 4: Pt will improve BLE strength to WNL in order to ambulate for greater than 1 mile to conitnue with exercise program.  PT Short Term Goal 5: Pt will improve her DF to 5 degrees for improved gait mechanics.  PT Short Term Goal 6: Pt will improve her LEFS to greater than 50/80 for improved percieved functional ability.   Problem List Patient Active Problem List   Diagnosis Date Noted  . Muscle weakness 11/01/2012  . Calf pain 10/02/2012  . Asthma 07/12/2012  . Gastroesophageal reflux   . Nephrolithiasis   . Diverticulitis   . Chest pain   . Breast carcinoma 08/26/2009    General Behavior During Therapy: [facility] for tasks assessed/performed PT Plan of Care PT Patient Instructions: bring tennis shoes next visit Consulted and Agree with Plan of Care: Patient  GP Functional Assessment Tool Used: LEFS  Alic Hilburn, MPT, ATC 11/12/2012, 11:43 AM

## 2012-11-15 ENCOUNTER — Ambulatory Visit (HOSPITAL_COMMUNITY)
Admission: RE | Admit: 2012-11-15 | Discharge: 2012-11-15 | Disposition: A | Discharge status: Home / Self Care | Payer: Medicare Other | Source: Ambulatory Visit | Attending: Orthopedic Surgery | Admitting: Orthopedic Surgery

## 2012-11-15 NOTE — Progress Notes (Signed)
Physical Therapy Treatment Patient Details  Name: Betty Nguyen MRN: [id number] Date of Birth: [birth year]  Today's Date: 11/15/2012 Time: 1100-1140 PT Time Calculation (min): 40 min  Visit#: 6 of 12  Re-eval: 12/01/12 Charges: Therex x 20' (1100-1120) Manual x 8' (1121-1129) Gait x 10' (1130-1140)  Authorization: Medicare  Authorization Visit#: 6 of 10   Subjective: Symptoms/Limitations Symptoms: Pt states that her leg did not bother her all weekend. Pain Assessment Currently in Pain?: No/denies    Exercise/Treatments Ankle Stretches Slant Board Stretch: 3 reps;30 seconds Aerobic Exercises Tread Mill: 10 min 1.5 mph at end of session with vc's for equalizing wt shift  Ankle Exercises - Standing Rocker Board: 2 minutes Heel Walk (Round Trip): 1RT Toe Walk (Round Trip): 1RT Other Standing Ankle Exercises: Vector stance BLE 5x5 sec holds each direction Other Standing Ankle Exercises: Tandem gait 1RT  Manual Therapy Manual Therapy: Myofascial release Myofascial Release: Prone: Bil gastroc and solues to decrease fascial restrictions and decreae pain with STM afterwards  Physical Therapy Assessment and Plan PT Assessment and Plan Clinical Impression Statement: Progressed standing exercises with minimal difficulty. Pt is pain free throughout session. Fascial restrictions have decreased in bilateral lower extremities. Pt also displays improved ankle strategy in bilateral ankles. PT Frequency: Min 3X/week PT Duration:  (3 weeks) PT Treatment/Interventions: Gait training;Stair training;Therapeutic activities;Therapeutic exercise;Balance training;Neuromuscular re-education;Patient/family education;Manual techniques;Modalities PT Plan: Continue to progress ankle strength and stability and decrease pain per PT POC.      Problem List Patient Active Problem List   Diagnosis Date Noted  . Muscle weakness 11/01/2012  . Calf pain 10/02/2012  . Asthma 07/12/2012  . Gastroesophageal  reflux   . Nephrolithiasis   . Diverticulitis   . Chest pain   . Breast carcinoma 08/26/2009    General Behavior During Therapy: [facility] for tasks assessed/performed  Seth Bake, PTA 11/15/2012, 11:47 AM

## 2012-11-17 ENCOUNTER — Ambulatory Visit (HOSPITAL_COMMUNITY)
Admission: RE | Admit: 2012-11-17 | Discharge: 2012-11-17 | Disposition: A | Discharge status: Home / Self Care | Payer: Medicare Other | Source: Ambulatory Visit | Attending: Orthopedic Surgery | Admitting: Orthopedic Surgery

## 2012-11-17 DIAGNOSIS — L821 Other seborrheic keratosis: Secondary | ICD-10-CM | POA: Diagnosis not present

## 2012-11-17 DIAGNOSIS — L820 Inflamed seborrheic keratosis: Secondary | ICD-10-CM | POA: Diagnosis not present

## 2012-11-17 DIAGNOSIS — J441 Chronic obstructive pulmonary disease with (acute) exacerbation: Secondary | ICD-10-CM | POA: Diagnosis not present

## 2012-11-17 DIAGNOSIS — D235 Other benign neoplasm of skin of trunk: Secondary | ICD-10-CM | POA: Diagnosis not present

## 2012-11-17 DIAGNOSIS — C50019 Malignant neoplasm of nipple and areola, unspecified female breast: Secondary | ICD-10-CM | POA: Diagnosis not present

## 2012-11-17 DIAGNOSIS — E109 Type 1 diabetes mellitus without complications: Secondary | ICD-10-CM | POA: Diagnosis not present

## 2012-11-17 DIAGNOSIS — M545 Low back pain: Secondary | ICD-10-CM | POA: Diagnosis not present

## 2012-11-17 DIAGNOSIS — J45901 Unspecified asthma with (acute) exacerbation: Secondary | ICD-10-CM | POA: Diagnosis not present

## 2012-11-17 NOTE — Progress Notes (Signed)
Physical Therapy Treatment Patient Details  Name: Betty Nguyen MRN: [id number] Date of Birth: 02-16-1941  Today's Date: 11/17/2012 Time: 7829-5621 PT Time Calculation (min): 43 min  Visit#: 7 of 12  Re-eval: 12/01/12 Charges: Therex x 22' (1102-1124) Manual x 8' [phone]) Gait x 10' [phone])  Authorization: Medicare  Authorization Visit#: 7 of 10   Subjective: Symptoms/Limitations Symptoms: Pt states that she had some soreness in her calves after push mowing the lawn yesterday. Currently she is pain free. Pain Assessment Currently in Pain?: No/denies    Exercise/Treatments Ankle Stretches Slant Board Stretch: 3 reps;30 seconds Aerobic Exercises Tread Mill: 10 min 1.5 mph at end of session Ankle Exercises - Standing Rocker Board: 2 minutes Heel Walk (Round Trip): 1RT Toe Walk (Round Trip): 1RT Other Standing Ankle Exercises: Vector stance BLE 5x5 sec holds each direction Other Standing Ankle Exercises: Tandem gait 2RT  Manual Therapy Manual Therapy: Myofascial release Myofascial Release: Prone: Bil gastroc and soleus to decrease fascial restrictions and decreae pain with STM afterwards  Physical Therapy Assessment and Plan PT Assessment and Plan Clinical Impression Statement: Pt is pain free again this session. Pt's stability with SLS activities has improved. Pt is without complaint throughout session. MFR completed to bilateral calves to decrease fascial restrictions and tightness. Left calf presents with minimal restrictions. Right calf is tight and tender to deep pressure. Pt requires vc's for posture with gait on treadmill. Pt reports 0/10 pain at end of session.  PT Frequency: Min 3X/week PT Duration:  (3 weeks) PT Treatment/Interventions: Gait training;Stair training;Therapeutic activities;Therapeutic exercise;Balance training;Neuromuscular re-education;Patient/family education;Manual techniques;Modalities PT Plan: Continue to progress ankle strength and  stability and decrease pain per PT POC.      Problem List Patient Active Problem List   Diagnosis Date Noted  . Muscle weakness 11/01/2012  . Calf pain 10/02/2012  . Asthma 07/12/2012  . Gastroesophageal reflux   . Nephrolithiasis   . Diverticulitis   . Chest pain   . Breast carcinoma 08/26/2009    General Behavior During Therapy: [facility] for tasks assessed/performed   Seth Bake, PTA 11/17/2012, 12:00 PM

## 2012-11-18 ENCOUNTER — Encounter: Payer: Self-pay | Admitting: Gastroenterology

## 2012-11-19 ENCOUNTER — Ambulatory Visit (HOSPITAL_COMMUNITY)
Admission: RE | Admit: 2012-11-19 | Discharge: 2012-11-19 | Disposition: A | Discharge status: Home / Self Care | Payer: Medicare Other | Source: Ambulatory Visit | Attending: Orthopedic Surgery | Admitting: Orthopedic Surgery

## 2012-11-19 DIAGNOSIS — M6281 Muscle weakness (generalized): Secondary | ICD-10-CM

## 2012-11-19 DIAGNOSIS — M79662 Pain in left lower leg: Secondary | ICD-10-CM

## 2012-11-19 NOTE — Progress Notes (Signed)
Physical Therapy Treatment Patient Details  Name: Betty Nguyen MRN: [id number] Date of Birth: [birth year]  Today's Date: 11/19/2012 Time: 4098-1191 PT Time Calculation (min): 39 min Charges: TE: 4782-9562 Visit#: 8 of 12  Re-eval: 12/01/12    Authorization: Medicare  Authorization Time Period:    Authorization Visit#: 8 of 10   Subjective: Symptoms/Limitations Symptoms: Pt reports that she is pain free today.  She believes she is doing very well. Feels that her legs are still very weak.  Pain Assessment Pain Score: 0-No pain  Exercise/Treatments Stretches Gastroc Stretch: 3 reps;30 seconds Standing Heel Raises: 15 reps;Limitations Heel Raises Limitations: Toe raises 15 reps Lateral Step Up: Both;15 reps;Step Height: 4";Hand Hold: 1 Forward Step Up: Both;15 reps;Step Height: 4";Hand Hold: 0 Step Down: Both;15 reps;Step Height: 4";Hand Hold: 1 Functional Squat: 10 reps Supine Straight Leg Raises: Both;15 reps Sidelying Hip ABduction: Both;10 reps Prone  Hip Extension: Both;10 reps Aerobic Exercises Tread Mill: 12 min 2.0-2.5 mph 0.5 miles complete at beginning of session with explantion to improve heel strike  Physical Therapy Assessment and Plan PT Assessment and Plan Clinical Impression Statement: Added BLE strengthening to improve function with walking and dancing.  Educated pt on self massage for calf muscle.  Notable fatigue to LLE with LE activities today.  PT Frequency: Min 3X/week PT Duration:  (3 weeks) PT Treatment/Interventions: Gait training;Stair training;Therapeutic activities;Therapeutic exercise;Balance training;Neuromuscular re-education;Patient/family education;Manual techniques;Modalities PT Plan: Continue with focus on BLE strength in order to improve her ability to walk greater distances for walking program and return to dance safely.     Goals Home Exercise Program Pt/caregiver will Perform Home Exercise Program: Independently PT Goal: Perform  Home Exercise Program - Progress: Met PT Short Term Goals Time to Complete Short Term Goals: 3 weeks PT Short Term Goal 1: Pt will report pain less than a 2/10 for 25% of her day to be able to dance with greater ease.  PT Short Term Goal 1 - Progress: Met PT Short Term Goal 2: Pt will present with minimal fascial restrictions to her BLE gastroc, solues, hamstrings and gluteal region for iimproved QOL. PT Short Term Goal 2 - Progress: Met PT Short Term Goal 3: Pt will improve proprioceptive awareness and demonstreate Rt and Lt SLS x15 seconds on BLE. PT Short Term Goal 4: Pt will improve BLE strength to WNL in order to ambulate for greater than 1 mile to conitnue with exercise program.  (Pain after 0.25 miles walking 2.5 mph) PT Short Term Goal 4 - Progress: Progressing toward goal PT Short Term Goal 5: Pt will improve her DF to 5 degrees for improved gait mechanics.  PT Short Term Goal 5 - Progress: Met PT Short Term Goal 6: Pt will improve her LEFS to greater than 50/80 for improved percieved functional ability.   Problem List Patient Active Problem List   Diagnosis Date Noted  . Muscle weakness 11/01/2012  . Calf pain 10/02/2012  . Asthma 07/12/2012  . Gastroesophageal reflux   . Nephrolithiasis   . Diverticulitis   . Chest pain   . Breast carcinoma 08/26/2009    General Behavior During Therapy: [facility] for tasks assessed/performed  GP Functional Assessment Tool Used: LEFS  [name], MPT, ATC 11/19/2012, 12:08 PM

## 2012-11-22 ENCOUNTER — Ambulatory Visit (HOSPITAL_COMMUNITY)
Admission: RE | Admit: 2012-11-22 | Discharge: 2012-11-22 | Disposition: A | Discharge status: Home / Self Care | Payer: Medicare Other | Source: Ambulatory Visit | Attending: Orthopedic Surgery | Admitting: Orthopedic Surgery

## 2012-11-22 NOTE — Evaluation (Signed)
Physical Therapy Re-evaluation/Discharge  Patient Details  Name: Betty Nguyen MRN: [id number] Date of Birth: [birth year]  Today's Date: 11/22/2012 Time: 9629-5284 PT Time Calculation (min): 25 min Charges: MMT x 1 (1100-1108) Self care x 15' [phone])              Visit#: 9 of 12  Re-eval: 12/01/12 Assessment Diagnosis: Lt calf pain Next MD Visit: Dr. Romeo Apple  - 12/09/12  Authorization: Medicare    Authorization Visit#: 9 of 10   Subjective Symptoms/Limitations Symptoms: Pt states that she is ready for D/C. Pain Assessment Currently in Pain?: No/denies  Sensation/Coordination/Flexibility/Functional Tests Functional Tests Functional Tests: Lower Extremity Functional Scale (LEFS): 70/80 (was 33/80)  Assessment RLE Strength Right Hip Flexion: 5/5 Right Hip Extension: 5/5 Right Hip ABduction: 5/5 Right Knee Flexion: 5/5 Right Knee Extension: 5/5 LLE AROM (degrees) Left Ankle Dorsiflexion: 8 (was-2) Left Ankle Plantar Flexion: 40 (was 40) Left Ankle Inversion: 40 (was 40) Left Ankle Eversion: 30 (was 30) LLE Strength Left Hip Flexion: 5/5 Left Hip Extension: 5/5 Left Hip ABduction: 5/5 Left Knee Flexion: 5/5 Left Knee Extension: 5/5 Left Ankle Dorsiflexion: 5/5 Left Ankle Plantar Flexion: 5/5 Left Ankle Inversion: 5/5 Left Ankle Eversion: 5/5  Exercise/Treatments Mobility/Balance  Static Standing Balance Single Leg Stance - Right Leg: 20 (was 5) Single Leg Stance - Left Leg: 15 (was 5) Tandem Stance - Right Leg: 30 (was 10) Tandem Stance - Left Leg: 30 (was 10) Rhomberg - Eyes Opened: 30 (was 10) Rhomberg - Eyes Closed: 30 (was 10)   Physical Therapy Assessment and Plan PT Assessment and Plan Clinical Impression Statement: Pt has progressed well with therapy. Pt displasy imrpvoe strength ROM. All goals have been met. Pt is no longer limtied byt pain. Pt is independent HEP and comfortable with D/C. PT Frequency: Min 3X/week PT Duration:  (3 weeks) PT  Treatment/Interventions: Gait training;Stair training;Therapeutic activities;Therapeutic exercise;Balance training;Neuromuscular re-education;Patient/family education;Manual techniques;Modalities PT Plan: Recommend D/C to HEP.    Goals Home Exercise Program Pt/caregiver will Perform Home Exercise Program: Independently PT Short Term Goals Time to Complete Short Term Goals: 3 weeks PT Short Term Goal 1: Pt will report pain less than a 2/10 for 25% of her day to be able to dance with greater ease.  PT Short Term Goal 1 - Progress: Met PT Short Term Goal 2: Pt will present with minimal fascial restrictions to her BLE gastroc, solues, hamstrings and gluteal region for iimproved QOL. PT Short Term Goal 2 - Progress: Met PT Short Term Goal 3: Pt will improve proprioceptive awareness and demonstreate Rt and Lt SLS x15 seconds on BLE. PT Short Term Goal 3 - Progress: Met PT Short Term Goal 4: Pt will improve BLE strength to WNL in order to ambulate for greater than 1 mile to conitnue with exercise program.  PT Short Term Goal 4 - Progress: Met PT Short Term Goal 5: Pt will improve her DF to 5 degrees for improved gait mechanics.  PT Short Term Goal 5 - Progress: Met PT Short Term Goal 6: Pt will improve her LEFS to greater than 50/80 for improved percieved functional ability.  (70/80) PT Short Term Goal 6 - Progress: Met  Problem List Patient Active Problem List   Diagnosis Date Noted  . Muscle weakness 11/01/2012  . Calf pain 10/02/2012  . Asthma 07/12/2012  . Gastroesophageal reflux   . Nephrolithiasis   . Diverticulitis   . Chest pain   . Breast carcinoma 08/26/2009    PT - End  of Session Activity Tolerance: Patient tolerated treatment well General Behavior During Therapy: W[facility] for tasks assessed/performed  GP Functional Assessment Tool Used: LEFS Functional Limitation: Mobility: Walking and moving around Mobility: Walking and Moving Around Goal Status [phone]): At least 1 percent  but less than 20 percent impaired, limited or restricted Mobility: Walking and Moving Around Discharge Status [phone]): At least 1 percent but less than 20 percent impaired, limited or restricted  Seth Bake, PTA 11/22/2012, 11:33 AM  Physician Documentation Your signature is required to indicate approval of the treatment plan as stated above.  Please sign and either send electronically or make a copy of this report for your files and return this physician signed original.   Please mark one 1.__approve of plan  2. ___approve of plan with the following conditions.   ______________________________                                                          _____________________ Physician Signature                                                                                                             Date

## 2012-11-24 DIAGNOSIS — L820 Inflamed seborrheic keratosis: Secondary | ICD-10-CM | POA: Diagnosis not present

## 2012-11-25 ENCOUNTER — Encounter (HOSPITAL_COMMUNITY): Payer: Self-pay | Admitting: *Deleted

## 2012-11-25 ENCOUNTER — Ambulatory Visit (HOSPITAL_COMMUNITY): Payer: Medicare Other | Admitting: *Deleted

## 2012-11-25 ENCOUNTER — Emergency Department (HOSPITAL_COMMUNITY)
Admission: EM | Admit: 2012-11-25 | Discharge: 2012-11-25 | Disposition: A | Discharge status: Home / Self Care | Payer: Medicare Other | Attending: Emergency Medicine | Admitting: Emergency Medicine

## 2012-11-25 DIAGNOSIS — Z8719 Personal history of other diseases of the digestive system: Secondary | ICD-10-CM | POA: Insufficient documentation

## 2012-11-25 DIAGNOSIS — T361X5A Adverse effect of cephalosporins and other beta-lactam antibiotics, initial encounter: Secondary | ICD-10-CM | POA: Insufficient documentation

## 2012-11-25 DIAGNOSIS — Z87442 Personal history of urinary calculi: Secondary | ICD-10-CM | POA: Diagnosis not present

## 2012-11-25 DIAGNOSIS — R21 Rash and other nonspecific skin eruption: Secondary | ICD-10-CM | POA: Insufficient documentation

## 2012-11-25 DIAGNOSIS — Z8639 Personal history of other endocrine, nutritional and metabolic disease: Secondary | ICD-10-CM | POA: Insufficient documentation

## 2012-11-25 DIAGNOSIS — Z888 Allergy status to other drugs, medicaments and biological substances status: Secondary | ICD-10-CM | POA: Diagnosis not present

## 2012-11-25 DIAGNOSIS — Z853 Personal history of malignant neoplasm of breast: Secondary | ICD-10-CM | POA: Diagnosis not present

## 2012-11-25 DIAGNOSIS — Z79899 Other long term (current) drug therapy: Secondary | ICD-10-CM | POA: Diagnosis not present

## 2012-11-25 DIAGNOSIS — Z862 Personal history of diseases of the blood and blood-forming organs and certain disorders involving the immune mechanism: Secondary | ICD-10-CM | POA: Insufficient documentation

## 2012-11-25 DIAGNOSIS — R109 Unspecified abdominal pain: Secondary | ICD-10-CM | POA: Insufficient documentation

## 2012-11-25 DIAGNOSIS — Z8659 Personal history of other mental and behavioral disorders: Secondary | ICD-10-CM | POA: Diagnosis not present

## 2012-11-25 DIAGNOSIS — J45909 Unspecified asthma, uncomplicated: Secondary | ICD-10-CM | POA: Insufficient documentation

## 2012-11-25 DIAGNOSIS — R110 Nausea: Secondary | ICD-10-CM | POA: Insufficient documentation

## 2012-11-25 DIAGNOSIS — E119 Type 2 diabetes mellitus without complications: Secondary | ICD-10-CM | POA: Insufficient documentation

## 2012-11-25 DIAGNOSIS — T7840XA Allergy, unspecified, initial encounter: Secondary | ICD-10-CM

## 2012-11-25 MED ORDER — DIPHENHYDRAMINE HCL 25 MG PO CAPS
25.0000 mg | ORAL_CAPSULE | Freq: Once | ORAL | Status: AC
  Administered 2012-11-25: 25 mg via ORAL
  Filled 2012-11-25: qty 1

## 2012-11-25 MED ORDER — SULFAMETHOXAZOLE-TRIMETHOPRIM 800-160 MG PO TABS: 1.0000 | ORAL_TABLET | Freq: Two times a day (BID) | ORAL | Status: DC

## 2012-11-25 MED ORDER — PREDNISONE 20 MG PO TABS
40.0000 mg | ORAL_TABLET | Freq: Once | ORAL | Status: AC
  Administered 2012-11-25: 40 mg via ORAL
  Filled 2012-11-25: qty 2

## 2012-11-25 MED ORDER — FAMOTIDINE 20 MG PO TABS
20.0000 mg | ORAL_TABLET | Freq: Once | ORAL | Status: AC
  Administered 2012-11-25: 20 mg via ORAL
  Filled 2012-11-25: qty 1

## 2012-11-25 NOTE — ED Notes (Signed)
Dr Kohut in to assess 

## 2012-11-25 NOTE — ED Notes (Signed)
Pt presents with Hives/rash on bilateral legs . Pt started taking keflex today. Denies SOB. NAD noted.

## 2012-11-25 NOTE — ED Provider Notes (Signed)
CSN: [id number]     Arrival date & time 11/25/12  2105 History  This chart was scribed for Raeford Razor, MD by Bennett Scrape, ED Scribe. This patient was seen in room APA18/APA18 and the patient's care was started at 9:45 PM.   First MD Initiated Contact with Patient 11/25/12 2137     Chief Complaint  Patient presents with  . Allergic Reaction    The history is provided by the patient. No language interpreter was used.    HPI Comments: Betty Nguyen is a 72 y.o. female who presents to the Emergency Department complaining of rash described as hive-like with an associated mild burning pain on bilateral legs that she noticed about 15 minutes PTA. She reports that she started taking Keflex today after having a mole removed from her right shoulder and from her lower abdomen one week ago. She took her first dose around 6 AM this morning and the second dose around 9 PM tonight. She denies any worsening of pain, fevers, chills or drainage from the surgery sites. She believes that she was started on the antibiotic as a preventive measure. She denies any new medications besides the keflex or new hygiene products. Pt denies having prior episodes of similar symptoms. She reports lower abdominal cramping and increased BMs today but denies SOB, facial swelling and trouble breathing.   Past Medical History  Diagnosis Date  . Breast carcinoma 08/2009    Invasive ductal; left; lumpectomy and sentinel node excision  . Asthma   . Nephrolithiasis   . Gastroesophageal reflux   . Diverticulitis 2012    Treated medically  . Chest pain   . Anxiety and depression   . Hyperlipidemia   . Diabetes mellitus type II, controlled    Past Surgical History  Procedure Laterality Date  . Cesarean section    . Tubal ligation    . Nephrolithotomy    . Dilation and curettage of uterus    . Colonoscopy  2004  . Breast lumpectomy      Left   Family History  Problem Relation Age of Onset  . Coronary artery  disease Mother   . Stroke Father   . Diabetes Mellitus II Father    History  Substance Use Topics  . Smoking status: Never Smoker   . Smokeless tobacco: Not on file  . Alcohol Use: No   No OB history provided.  Review of Systems  HENT: Negative for facial swelling and trouble swallowing.   Respiratory: Negative for shortness of breath.   Gastrointestinal: Positive for nausea and abdominal pain (cramps). Negative for diarrhea.  Skin: Positive for rash.  All other systems reviewed and are negative.    Allergies  Codeine  Home Medications   Current Outpatient Rx  Name  Route  Sig  Dispense  Refill  . ALPRAZolam (XANAX) 0.5 MG tablet   Oral   Take 0.5 mg by mouth at bedtime as needed for sleep.         Marland Kitchen anastrozole (ARIMIDEX) 1 MG tablet   Oral   Take 1 mg by mouth daily.         . cetirizine (ZYRTEC) 10 MG tablet   Oral   Take 10 mg by mouth daily.         . ciclesonide (ALVESCO) 160 MCG/ACT inhaler   Inhalation   Inhale 1 puff into the lungs 2 (two) times daily.         . valACYclovir (VALTREX) 500 MG tablet  Oral   Take 500 mg by mouth 2 (two) times daily.          Triage Vitals: BP 142/75  Pulse 79  Temp(Src) 97.9 F (36.6 C) (Oral)  Resp 20  Ht 5\' 1"  (1.549 m)  Wt 155 lb (70.308 kg)  BMI 29.3 kg/m2  SpO2 98%  Physical Exam  Nursing note and vitals reviewed. Constitutional: She is oriented to person, place, and time. She appears well-developed and well-nourished. No distress.  HENT:  Head: Normocephalic and atraumatic.  No oral swelling, normal sounding phonation, handling secretions   Eyes: Conjunctivae and EOM are normal.  Neck: Normal range of motion. Neck supple. No tracheal deviation present.  Cardiovascular: Normal rate, regular rhythm and normal heart sounds.   No murmur heard. Pulmonary/Chest: Effort normal and breath sounds normal. No stridor. No respiratory distress. She has no wheezes. She has no rales.  Abdominal: Soft.  Bowel sounds are normal. There is no tenderness.  Musculoskeletal: Normal range of motion. She exhibits no edema.  Neurological: She is alert and oriented to person, place, and time. No cranial nerve deficit.  Skin: Skin is warm and dry. Rash noted.  Erythematous macular papular rash to bilateral lower extremities from feet to proximal thighs, blanches, non-raised, non-tender, no drainage 2 dime-sized lesions consistent with recent excisional biopsy, one to right posterior shoulder and one to mons pubis area, minimal surrounding erythema consistent with normal wound healing. Generally appear to be healing well without complication  Psychiatric: She has a normal mood and affect. Her behavior is normal.    ED Course   Procedures (including critical care time)  DIAGNOSTIC STUDIES: Oxygen Saturation is 98% on room air, normal by my interpretation.    COORDINATION OF CARE: 9:50 PM-Advised pt to stop the antibiotic, will switch to a new antibiotic. However, advised pt to watch the areas and take the antibiotic only if the areas appear to be getting more red or more painful. Discussed treatment plan which includes medications with pt at bedside and pt agreed to plan.   Labs Reviewed - No data to display No results found. 1. Rash   2. Allergic reaction caused by a drug     MDM  71yf with rash to LE. Only new identified exposure is keflex. No evidence of airway compromise or HD instability. No desquamation or mucus membrane involvement. Biopsy sites do not appear infected to me. DC keflex. Script for bactrim provided. Continued wound care and return precautions discussed.   I personally preformed the services scribed in my presence. The recorded information has been reviewed is accurate. Raeford Razor, MD.    Raeford Razor, MD 11/30/12 [phone]

## 2012-11-30 ENCOUNTER — Telehealth: Payer: Self-pay | Admitting: Orthopedic Surgery

## 2012-11-30 NOTE — Telephone Encounter (Signed)
Patient called, states wishes to cancel follow up appointment for 12/09/12; states that she did the physical therapy/ultrasound at [facility], and that "at her visit in June, Dr Romeo Apple said he could not feel anything there."  She said that the therapist worked on the "deep pain" that she was having, and patient said it is a little better.    Any other recommendations, as patient still requests cancel of upcoming appointment.  Her ph# [phone]

## 2012-12-09 ENCOUNTER — Ambulatory Visit: Payer: Medicare Other | Admitting: Orthopedic Surgery

## 2012-12-15 ENCOUNTER — Ambulatory Visit (AMBULATORY_SURGERY_CENTER): Payer: Medicare Other | Admitting: *Deleted

## 2012-12-15 VITALS — Ht 61.25 in | Wt 170.4 lb

## 2012-12-15 DIAGNOSIS — Z1211 Encounter for screening for malignant neoplasm of colon: Secondary | ICD-10-CM

## 2012-12-15 MED ORDER — MOVIPREP 100 G PO SOLR: 1.0000 | Freq: Once | ORAL | Status: DC

## 2012-12-15 NOTE — Progress Notes (Signed)
No egg or soy allergy. ewm Last colon 2003 with dr Jarold Motto with severe diverticulitis and hemorrhoids in epic. ewm No problems with past sedation except hard to wake at times. ewm No cpap/ home 02 use. ewm No bp/ sticks in left arm. ewm Pt with no email, no computer. ewm

## 2012-12-29 ENCOUNTER — Ambulatory Visit (AMBULATORY_SURGERY_CENTER): Payer: Medicare Other | Admitting: Gastroenterology

## 2012-12-29 ENCOUNTER — Encounter: Payer: Self-pay | Admitting: Gastroenterology

## 2012-12-29 VITALS — BP 146/75 | HR 63 | Temp 97.6°F | Resp 16 | Ht 61.0 in | Wt 170.4 lb

## 2012-12-29 DIAGNOSIS — J45909 Unspecified asthma, uncomplicated: Secondary | ICD-10-CM | POA: Diagnosis not present

## 2012-12-29 DIAGNOSIS — K5730 Diverticulosis of large intestine without perforation or abscess without bleeding: Secondary | ICD-10-CM

## 2012-12-29 DIAGNOSIS — D126 Benign neoplasm of colon, unspecified: Secondary | ICD-10-CM | POA: Diagnosis not present

## 2012-12-29 DIAGNOSIS — E119 Type 2 diabetes mellitus without complications: Secondary | ICD-10-CM | POA: Diagnosis not present

## 2012-12-29 DIAGNOSIS — Z886 Allergy status to analgesic agent status: Secondary | ICD-10-CM | POA: Diagnosis not present

## 2012-12-29 DIAGNOSIS — F329 Major depressive disorder, single episode, unspecified: Secondary | ICD-10-CM | POA: Diagnosis not present

## 2012-12-29 DIAGNOSIS — Z1211 Encounter for screening for malignant neoplasm of colon: Secondary | ICD-10-CM

## 2012-12-29 LAB — HM COLONOSCOPY

## 2012-12-29 MED ORDER — SODIUM CHLORIDE 0.9 % IV SOLN: 500.0000 mL | INTRAVENOUS | Status: DC

## 2012-12-29 NOTE — Progress Notes (Signed)
Lidocaine-40mg IV prior to Propofol InductionPropofol given over incremental dosages 

## 2012-12-29 NOTE — Patient Instructions (Addendum)
YOU HAD AN ENDOSCOPIC PROCEDURE TODAY AT THE [facility]: Refer to the procedure report that was given to you for any specific questions about what was found during the examination.  If the procedure report does not answer your questions, please call your gastroenterologist to clarify.  If you requested that your care partner not be given the details of your procedure findings, then the procedure report has been included in a sealed envelope for you to review at your convenience later.  YOU SHOULD EXPECT: Some feelings of bloating in the abdomen. Passage of more gas than usual.  Walking can help get rid of the air that was put into your GI tract during the procedure and reduce the bloating. If you had a lower endoscopy (such as a colonoscopy or flexible sigmoidoscopy) you may notice spotting of blood in your stool or on the toilet paper. If you underwent a bowel prep for your procedure, then you may not have a normal bowel movement for a few days.  DIET: Your first meal following the procedure should be a light meal and then it is ok to progress to your normal diet.  A half-sandwich or bowl of soup is an example of a good first meal.  Heavy or fried foods are harder to digest and may make you feel nauseous or bloated.  Likewise meals heavy in dairy and vegetables can cause extra gas to form and this can also increase the bloating.  Drink plenty of fluids but you should avoid alcoholic beverages for 24 hours.  ACTIVITY: Your care partner should take you home directly after the procedure.  You should plan to take it easy, moving slowly for the rest of the day.  You can resume normal activity the day after the procedure however you should NOT DRIVE or use heavy machinery for 24 hours (because of the sedation medicines used during the test).    SYMPTOMS TO REPORT IMMEDIATELY: A gastroenterologist can be reached at any hour.  During normal business hours, 8:30 AM to 5:00 PM Monday through Friday,  call [phone].  After hours and on weekends, please call the GI answering service at [phone] who will take a message and have the physician on call contact you.   Following lower endoscopy (colonoscopy or flexible sigmoidoscopy):  Excessive amounts of blood in the stool  Significant tenderness or worsening of abdominal pains  Swelling of the abdomen that is new, acute  Fever of 100F or higher  FOLLOW UP: If any biopsies were taken you will be contacted by phone or by letter within the next 1-3 weeks.  Call your gastroenterologist if you have not heard about the biopsies in 3 weeks.  Our staff will call the home number listed on your records the next business day following your procedure to check on you and address any questions or concerns that you may have at that time regarding the information given to you following your procedure. This is a courtesy call and so if there is no answer at the home number and we have not heard from you through the emergency physician on call, we will assume that you have returned to your regular daily activities without incident.  SIGNATURES/CONFIDENTIALITY: You and/or your care partner have signed paperwork which will be entered into your electronic medical record.  These signatures attest to the fact that that the information above on your After Visit Summary has been reviewed and is understood.  Full responsibility of the confidentiality of this   discharge information lies with you and/or your care-partner.  Diverticulosis, polyps, high fiber diet-handouts given  Repeat colonoscopy will be determined by pathology. 

## 2012-12-29 NOTE — Op Note (Signed)
 [facility] [address] [place], [address]   COLONOSCOPY PROCEDURE REPORT  PATIENT: Betty Nguyen, Betty Nguyen  MR#: [id number] BIRTHDATE: 05/24/1940 , 71  yrs. old GENDER: Female ENDOSCOPIST: Mardella Layman, MD, [facility] REFERRED BY: PROCEDURE DATE:  12/29/2012 PROCEDURE:   Colonoscopy with snare polypectomy First Screening Colonoscopy - Avg.  risk and is 50 yrs.  old or older - No.      History of Adenoma - Now for follow-up colonoscopy & has been > or = to 3 yrs.  N/A ASA CLASS:   Class II INDICATIONS:average risk screening. MEDICATIONS: propofol (Diprivan) 100mg  IV  DESCRIPTION OF PROCEDURE:   After the risks benefits and alternatives of the procedure were thoroughly explained, informed consent was obtained.  A digital rectal exam revealed no abnormalities of the rectum.   The LB [id number]  endoscope was introduced through the anus and advanced to the cecum, which was identified by both the appendix and ileocecal valve. No adverse events experienced.   The quality of the prep was excellent, using MoviPrep  The instrument was then slowly withdrawn as the colon was fully examined.      COLON FINDINGS: There was moderate diverticulosis noted in the descending colon and sigmoid colon with associated colonic spasm and petechiae.   A polypoid shaped sessile polyp ranging between 3-86mm in size was found in the rectum.  A polypectomy was performed with a cold snare.  The resection was complete and the polyp tissue was completely retrieved.   The colon was otherwise normal.  There was no diverticulosis, inflammation, polyps or cancers unless previously stated.  Retroflexed views revealed no abnormalities but perianal papillae. The time to cecum=2 minutes 00 seconds. Withdrawal time=6 minutes 00 seconds.  The scope was withdrawn and the procedure completed. COMPLICATIONS: There were no complications.  ENDOSCOPIC IMPRESSION: 1.   There was moderate diverticulosis  noted in the descending colon and sigmoid colon 2.   Sessile polyp ranging between 3-22mm in size was found in the rectum; polypectomy was performed with a cold snare 3.   The colon was otherwise normal  RECOMMENDATIONS: 1.  Repeat colonoscopy in 5 years if polyp adenomatous; otherwise 10 years 2.  Continue current medications 3.  High fiber diet   eSigned:  Mardella Layman, MD, [facility] 12/29/2012 8:44 AM   cc:   PATIENT NAME:  [name], [name] MR#: [id number]

## 2012-12-29 NOTE — Progress Notes (Signed)
Patient did not experience any of the following events: a burn prior to discharge; a fall within the facility; wrong site/side/patient/procedure/implant event; or a hospital transfer or hospital admission upon discharge from the facility. (G8907) Patient did not have preoperative order for IV antibiotic SSI prophylaxis. (G8918)  

## 2012-12-29 NOTE — Progress Notes (Signed)
Called to room to assist during endoscopic procedure.  Patient ID and intended procedure confirmed with present staff. Received instructions for my participation in the procedure from the performing physician.  

## 2012-12-30 ENCOUNTER — Telehealth: Payer: Self-pay | Admitting: *Deleted

## 2012-12-30 NOTE — Telephone Encounter (Signed)
  Follow up Call-  Call back number 12/29/2012  Post procedure Call Back phone  # [phone]  Permission to leave phone message Yes     Patient questions:  Do you have a fever, pain , or abdominal swelling? no Pain Score  0 *  Have you tolerated food without any problems? yes  Have you been able to return to your normal activities? yes  Do you have any questions about your discharge instructions: Diet   no Medications  no Follow up visit  no  Do you have questions or concerns about your Care? no  Actions: * If pain score is 4 or above: No action needed, pain <4.

## 2013-01-04 ENCOUNTER — Encounter: Payer: Self-pay | Admitting: Gastroenterology

## 2013-02-07 DIAGNOSIS — Z23 Encounter for immunization: Secondary | ICD-10-CM | POA: Diagnosis not present

## 2013-02-18 DIAGNOSIS — E109 Type 1 diabetes mellitus without complications: Secondary | ICD-10-CM | POA: Diagnosis not present

## 2013-02-18 DIAGNOSIS — J441 Chronic obstructive pulmonary disease with (acute) exacerbation: Secondary | ICD-10-CM | POA: Diagnosis not present

## 2013-02-18 DIAGNOSIS — C50019 Malignant neoplasm of nipple and areola, unspecified female breast: Secondary | ICD-10-CM | POA: Diagnosis not present

## 2013-02-18 DIAGNOSIS — M545 Low back pain: Secondary | ICD-10-CM | POA: Diagnosis not present

## 2013-03-23 ENCOUNTER — Other Ambulatory Visit (HOSPITAL_COMMUNITY): Payer: Self-pay | Admitting: Pulmonary Disease

## 2013-03-23 DIAGNOSIS — M5410 Radiculopathy, site unspecified: Secondary | ICD-10-CM

## 2013-03-23 DIAGNOSIS — F411 Generalized anxiety disorder: Secondary | ICD-10-CM | POA: Diagnosis not present

## 2013-03-23 DIAGNOSIS — M545 Low back pain: Secondary | ICD-10-CM | POA: Diagnosis not present

## 2013-03-28 DIAGNOSIS — M79609 Pain in unspecified limb: Secondary | ICD-10-CM | POA: Diagnosis not present

## 2013-03-28 DIAGNOSIS — M62838 Other muscle spasm: Secondary | ICD-10-CM | POA: Diagnosis not present

## 2013-03-28 DIAGNOSIS — M949 Disorder of cartilage, unspecified: Secondary | ICD-10-CM

## 2013-03-28 DIAGNOSIS — M899 Disorder of bone, unspecified: Secondary | ICD-10-CM

## 2013-03-28 DIAGNOSIS — C50419 Malignant neoplasm of upper-outer quadrant of unspecified female breast: Secondary | ICD-10-CM

## 2013-03-28 DIAGNOSIS — C50919 Malignant neoplasm of unspecified site of unspecified female breast: Secondary | ICD-10-CM | POA: Diagnosis not present

## 2013-03-29 ENCOUNTER — Encounter (HOSPITAL_COMMUNITY): Payer: Self-pay

## 2013-03-29 ENCOUNTER — Ambulatory Visit (HOSPITAL_COMMUNITY)
Admission: RE | Admit: 2013-03-29 | Discharge: 2013-03-29 | Disposition: A | Discharge status: Home / Self Care | Payer: Medicare Other | Source: Ambulatory Visit | Attending: Pulmonary Disease | Admitting: Pulmonary Disease

## 2013-03-29 DIAGNOSIS — M6281 Muscle weakness (generalized): Secondary | ICD-10-CM | POA: Insufficient documentation

## 2013-03-29 DIAGNOSIS — M47817 Spondylosis without myelopathy or radiculopathy, lumbosacral region: Secondary | ICD-10-CM | POA: Diagnosis not present

## 2013-03-29 DIAGNOSIS — M545 Low back pain, unspecified: Secondary | ICD-10-CM | POA: Insufficient documentation

## 2013-03-29 DIAGNOSIS — M5380 Other specified dorsopathies, site unspecified: Secondary | ICD-10-CM | POA: Insufficient documentation

## 2013-03-29 DIAGNOSIS — M5126 Other intervertebral disc displacement, lumbar region: Secondary | ICD-10-CM | POA: Insufficient documentation

## 2013-03-29 DIAGNOSIS — R252 Cramp and spasm: Secondary | ICD-10-CM | POA: Diagnosis not present

## 2013-03-29 DIAGNOSIS — M5410 Radiculopathy, site unspecified: Secondary | ICD-10-CM

## 2013-04-04 DIAGNOSIS — N390 Urinary tract infection, site not specified: Secondary | ICD-10-CM | POA: Diagnosis not present

## 2013-04-07 ENCOUNTER — Other Ambulatory Visit (HOSPITAL_COMMUNITY): Payer: Self-pay | Admitting: Pulmonary Disease

## 2013-04-07 DIAGNOSIS — K862 Cyst of pancreas: Secondary | ICD-10-CM

## 2013-04-15 ENCOUNTER — Ambulatory Visit (HOSPITAL_COMMUNITY)
Admission: RE | Admit: 2013-04-15 | Discharge: 2013-04-15 | Disposition: A | Discharge status: Home / Self Care | Payer: Medicare Other | Source: Ambulatory Visit | Attending: Pulmonary Disease | Admitting: Pulmonary Disease

## 2013-04-15 DIAGNOSIS — K862 Cyst of pancreas: Secondary | ICD-10-CM | POA: Insufficient documentation

## 2013-04-15 DIAGNOSIS — K7689 Other specified diseases of liver: Secondary | ICD-10-CM | POA: Diagnosis not present

## 2013-04-15 DIAGNOSIS — Z09 Encounter for follow-up examination after completed treatment for conditions other than malignant neoplasm: Secondary | ICD-10-CM | POA: Insufficient documentation

## 2013-04-15 LAB — POCT I-STAT, CHEM 8
BUN: 12 mg/dL (ref 6–23)
Calcium, Ion: 1.25 mmol/L (ref 1.13–1.30)
Chloride: 103 mEq/L (ref 96–112)

## 2013-04-15 MED ORDER — IOHEXOL 300 MG/ML  SOLN
100.0000 mL | Freq: Once | INTRAMUSCULAR | Status: AC | PRN
  Administered 2013-04-15: 100 mL via INTRAVENOUS

## 2013-04-25 DIAGNOSIS — J441 Chronic obstructive pulmonary disease with (acute) exacerbation: Secondary | ICD-10-CM | POA: Diagnosis not present

## 2013-04-25 DIAGNOSIS — J040 Acute laryngitis: Secondary | ICD-10-CM | POA: Diagnosis not present

## 2013-04-25 DIAGNOSIS — E109 Type 1 diabetes mellitus without complications: Secondary | ICD-10-CM | POA: Diagnosis not present

## 2013-05-20 DIAGNOSIS — M545 Low back pain, unspecified: Secondary | ICD-10-CM | POA: Diagnosis not present

## 2013-05-20 DIAGNOSIS — J45902 Unspecified asthma with status asthmaticus: Secondary | ICD-10-CM | POA: Diagnosis not present

## 2013-05-20 DIAGNOSIS — F411 Generalized anxiety disorder: Secondary | ICD-10-CM | POA: Diagnosis not present

## 2013-05-20 DIAGNOSIS — E109 Type 1 diabetes mellitus without complications: Secondary | ICD-10-CM | POA: Diagnosis not present

## 2013-05-24 ENCOUNTER — Telehealth: Payer: Self-pay | Admitting: Gastroenterology

## 2013-05-24 NOTE — Telephone Encounter (Signed)
Happy to see her as a new patient

## 2013-05-24 NOTE — Telephone Encounter (Signed)
Notified dr Luan Pulling ofc of the appt and mailed pt a letter with appt info, map and new pt questionnaire.

## 2013-05-24 NOTE — Telephone Encounter (Signed)
Dr Sharlett Iles, I know nothing about this pt except for a pancreatic Cyst per 12/14 CT scan. COLON 12/29/12 with tubular adenoma. Hx of Breast cancer. Dr Sharlett Iles, would you like to see this pt or advise me of another doc to send her to? Thanks.

## 2013-05-24 NOTE — Telephone Encounter (Signed)
She please see Dr. Ardis Hughs in the office for evaluation of her multiple pancreatic cysts which are probably benign in nature.  I have not previously seen this patient in the office, it does not look like this is an emergency.

## 2013-05-24 NOTE — Telephone Encounter (Signed)
Dr Ardis Hughs will you see this pt as a New Pt? Thanks.

## 2013-06-21 ENCOUNTER — Ambulatory Visit: Payer: Medicare Other | Admitting: Gastroenterology

## 2013-08-09 ENCOUNTER — Encounter: Payer: Self-pay | Admitting: Gastroenterology

## 2013-08-09 ENCOUNTER — Other Ambulatory Visit (INDEPENDENT_AMBULATORY_CARE_PROVIDER_SITE_OTHER): Payer: Medicare Other

## 2013-08-09 ENCOUNTER — Ambulatory Visit (INDEPENDENT_AMBULATORY_CARE_PROVIDER_SITE_OTHER): Payer: Medicare Other | Admitting: Gastroenterology

## 2013-08-09 VITALS — BP 132/70 | HR 68 | Ht 61.0 in | Wt 172.4 lb

## 2013-08-09 DIAGNOSIS — K862 Cyst of pancreas: Secondary | ICD-10-CM

## 2013-08-09 DIAGNOSIS — K863 Pseudocyst of pancreas: Secondary | ICD-10-CM | POA: Diagnosis not present

## 2013-08-09 DIAGNOSIS — Z8509 Personal history of malignant neoplasm of other digestive organs: Secondary | ICD-10-CM | POA: Diagnosis not present

## 2013-08-09 LAB — COMPREHENSIVE METABOLIC PANEL
ALK PHOS: 55 U/L (ref 39–117)
ALT: 17 U/L (ref 0–35)
AST: 19 U/L (ref 0–37)
Albumin: 3.9 g/dL (ref 3.5–5.2)
BUN: 17 mg/dL (ref 6–23)
CO2: 30 mEq/L (ref 19–32)
CREATININE: 0.8 mg/dL (ref 0.4–1.2)
Calcium: 10 mg/dL (ref 8.4–10.5)
Chloride: 101 mEq/L (ref 96–112)
GFR: 72.75 mL/min (ref >60.00)
Glucose, Bld: 184 mg/dL — ABNORMAL HIGH (ref 70–99)
POTASSIUM: 4.5 meq/L (ref 3.5–5.1)
Sodium: 138 mEq/L (ref 135–145)
Total Bilirubin: 1.3 mg/dL — ABNORMAL HIGH (ref 0.3–1.2)
Total Protein: 7.8 g/dL (ref 6.0–8.3)

## 2013-08-09 NOTE — Patient Instructions (Addendum)
You will have labs checked today in the basement lab.  Please head down after you check out with the front desk  (cmet, CA 19-9). MRI of abdomen, pancreatic protocol: dx: numerous cysts in pancreas on CT 03/2013 (she has questions about out of pocket expense). You have been scheduled for an MRI at [facility] radiology on 08/12/13. Your appointment time is 12 noon. Please arrive 15 minutes prior to your appointment time for registration purposes. Nothing to eat or drink 4 hours prior to the test.  However, if you have any metal in your body, have a pacemaker or defibrillator, please be sure to let your ordering physician know. This test typically takes 45 minutes to 1 hour to complete.

## 2013-08-09 NOTE — Progress Notes (Signed)
Review of pertinent gastrointestinal problems: 1. Tubular adenoma:  Dr. Sharlett Iles colonoscopy 12/2012; subCM polyp in rectum, was TA on pathology. He recommended recall colonoscopy at 5 year interval. Also diverticulosis noted.    HPI: This is a  very pleasant 73 year old woman whom I am meeting for the first time today. She was previously a patient of Dr. Verl Blalock who has retired recently.  She was hosptalized in 2008 with acute diverticulitis.  CT at that point noted cysts, incidentally noted.  Recently she had CT scan 4-5 months ago this was done to workup leg (claudication?) problems.  Was never a big etoh drinker.  NO FH of pancreatic cancer.  Overall her weight has been fluctuating, often diet related (2009 diet related 20 pounds down).  Past 6 months no changes.  No significant abdominal pains.  Did have brief "catch" of RLQ    IMPRESSION from CT scan 03/2013:  1. No acute findings. 2. Since previous CT of 02/05/2007 there has been mild increase in size of pancreatic cystic lesions. Given the slow growth of these structures findings likely represent multiple benign cystic neoplasms of the pancreas such as branch duct IPMN or serous cystadenomas. Alternatively these could represent postinflammatory pseudocysts. Continued surveillance of these likely benign lesions is advised. The next followup examination should be obtained an 6 months. If at the largest lesion gets larger than 3 cm then endoscopic ultrasound guided aspiration should be considered. 3. Stable liver cysts.    Review of systems: Pertinent positive and negative review of systems were noted in the above HPI section. Complete review of systems was performed and was otherwise normal.    Past Medical History  Diagnosis Date  . Breast carcinoma 08/2009    Invasive ductal; left; lumpectomy and sentinel node excision  . Asthma   . Nephrolithiasis   . Gastroesophageal reflux   . Diverticulitis 2012    Treated  medically  . Chest pain   . Anxiety and depression   . Hyperlipidemia   . Diabetes mellitus type II, controlled     diet controlled,exercise, no meds  . Depression   . Anxiety     Past Surgical History  Procedure Laterality Date  . Cesarean section    . Tubal ligation    . Nephrolithotomy    . Dilation and curettage of uterus    . Colonoscopy  2004  . Breast lumpectomy      Left    Current Outpatient Prescriptions  Medication Sig Dispense Refill  . albuterol (PROVENTIL HFA;VENTOLIN HFA) 108 (90 BASE) MCG/ACT inhaler Inhale 2 puffs into the lungs every 6 (six) hours as needed for wheezing.      Marland Kitchen anastrozole (ARIMIDEX) 1 MG tablet Take 1 mg by mouth daily.      . calcium-vitamin D (OSCAL WITH D) 500-200 MG-UNIT per tablet Take 1 tablet by mouth 2 (two) times daily.      . ciclesonide (ALVESCO) 160 MCG/ACT inhaler Inhale 1 puff into the lungs 2 (two) times daily.      . magnesium oxide (MAG-OX) 400 MG tablet Take 400 mg by mouth daily.      . valACYclovir (VALTREX) 500 MG tablet Take 500 mg by mouth 2 (two) times daily.       No current facility-administered medications for this visit.    Allergies as of 08/09/2013 - Review Complete 08/09/2013  Allergen Reaction Noted  . Biaxin [clarithromycin] Other (See Comments) 11/25/2012  . Codeine Other (See Comments) 08/21/2010  . Metronidazole Other (  See Comments) 11/25/2012  . Cephalosporins Hives 11/25/2012    Family History  Problem Relation Age of Onset  . Coronary artery disease Mother   . Stroke Father   . Diabetes Mellitus II Father   . Colon cancer Neg Hx     History   Social History  . Marital Status: Legally Separated    Spouse Name: N/A    Number of Children: N/A  . Years of Education: N/A   Occupational History  . Not on file.   Social History Main Topics  . Smoking status: Never Smoker   . Smokeless tobacco: Never Used  . Alcohol Use: No  . Drug Use: No  . Sexual Activity: Not on file   Other  Topics Concern  . Not on file   Social History Narrative  . No narrative on file       Physical Exam: BP 132/70  Pulse 68  Ht 5\' 1"  (1.549 m)  Wt 172 lb 6.4 oz (78.2 kg)  BMI 32.59 kg/m2 Constitutional: generally well-appearing Psychiatric: alert and oriented x3 Eyes: extraocular movements intact Mouth: oral pharynx moist, no lesions Neck: supple no lymphadenopathy Cardiovascular: heart regular rate and rhythm Lungs: clear to auscultation bilaterally Abdomen: soft, nontender, nondistended, no obvious ascites, no peritoneal signs, normal bowel sounds Extremities: no lower extremity edema bilaterally Skin: no lesions on visible extremities    Assessment and plan: 73 y.o. female with  pancreatic cysts  She has had cysts noted in her pancreas as far back as 2008. In 7 years since these were first noted has been slight growth in some of the cysts, whereas some of the other cysts have decreased in size. She has no concerning clinical history for pancreatic neoplasm. Her weight is stable, she has no significant abdominal pains, no family history of pancreatic cancer. I explained to her that I think it is very unlikely she has anything serious going on in her pancreas. New 2014 AGA guidelines for incidental pancreatic cysts recommends MRI surveillance. I will order MRI of her pancreas as well as complete metabolic profile and CA 27-7 level. If there is nothing concerning by imaging or labs then she would require a repeat MRI in one year and then every other year following that for a total of 5 years. If pancreatic cysts show no significant changes over that period of time then surveillance can be stopped.

## 2013-08-10 LAB — CANCER ANTIGEN 19-9: CA 19-9: 10.8 U/mL (ref <35.0)

## 2013-08-12 ENCOUNTER — Ambulatory Visit (HOSPITAL_COMMUNITY)
Admission: RE | Admit: 2013-08-12 | Discharge: 2013-08-12 | Disposition: A | Discharge status: Home / Self Care | Payer: Medicare Other | Source: Ambulatory Visit | Attending: Gastroenterology | Admitting: Gastroenterology

## 2013-08-12 DIAGNOSIS — K862 Cyst of pancreas: Secondary | ICD-10-CM | POA: Insufficient documentation

## 2013-08-12 DIAGNOSIS — E278 Other specified disorders of adrenal gland: Secondary | ICD-10-CM | POA: Insufficient documentation

## 2013-08-12 DIAGNOSIS — Z853 Personal history of malignant neoplasm of breast: Secondary | ICD-10-CM | POA: Diagnosis not present

## 2013-08-12 DIAGNOSIS — N281 Cyst of kidney, acquired: Secondary | ICD-10-CM | POA: Diagnosis not present

## 2013-08-12 DIAGNOSIS — K863 Pseudocyst of pancreas: Secondary | ICD-10-CM | POA: Diagnosis not present

## 2013-08-12 DIAGNOSIS — D1809 Hemangioma of other sites: Secondary | ICD-10-CM | POA: Diagnosis not present

## 2013-08-12 MED ORDER — GADOBENATE DIMEGLUMINE 529 MG/ML IV SOLN
16.0000 mL | Freq: Once | INTRAVENOUS | Status: AC | PRN
  Administered 2013-08-12: 16 mL via INTRAVENOUS

## 2013-08-15 DIAGNOSIS — J441 Chronic obstructive pulmonary disease with (acute) exacerbation: Secondary | ICD-10-CM | POA: Diagnosis not present

## 2013-08-15 DIAGNOSIS — J45901 Unspecified asthma with (acute) exacerbation: Secondary | ICD-10-CM | POA: Diagnosis not present

## 2013-08-15 DIAGNOSIS — E109 Type 1 diabetes mellitus without complications: Secondary | ICD-10-CM | POA: Diagnosis not present

## 2013-08-15 DIAGNOSIS — F411 Generalized anxiety disorder: Secondary | ICD-10-CM | POA: Diagnosis not present

## 2013-08-15 DIAGNOSIS — C50919 Malignant neoplasm of unspecified site of unspecified female breast: Secondary | ICD-10-CM | POA: Diagnosis not present

## 2013-08-15 DIAGNOSIS — E785 Hyperlipidemia, unspecified: Secondary | ICD-10-CM | POA: Diagnosis not present

## 2013-08-24 ENCOUNTER — Other Ambulatory Visit (HOSPITAL_COMMUNITY): Payer: Self-pay | Admitting: Pulmonary Disease

## 2013-08-24 DIAGNOSIS — R928 Other abnormal and inconclusive findings on diagnostic imaging of breast: Secondary | ICD-10-CM

## 2013-09-06 ENCOUNTER — Other Ambulatory Visit (HOSPITAL_COMMUNITY): Payer: Self-pay | Admitting: Pulmonary Disease

## 2013-09-06 ENCOUNTER — Ambulatory Visit (HOSPITAL_COMMUNITY)
Admission: RE | Admit: 2013-09-06 | Discharge: 2013-09-06 | Disposition: A | Discharge status: Home / Self Care | Payer: Medicare Other | Source: Ambulatory Visit | Attending: Pulmonary Disease | Admitting: Pulmonary Disease

## 2013-09-06 DIAGNOSIS — Z1231 Encounter for screening mammogram for malignant neoplasm of breast: Secondary | ICD-10-CM | POA: Insufficient documentation

## 2013-09-06 DIAGNOSIS — R922 Inconclusive mammogram: Secondary | ICD-10-CM | POA: Diagnosis not present

## 2013-09-06 DIAGNOSIS — C50919 Malignant neoplasm of unspecified site of unspecified female breast: Secondary | ICD-10-CM

## 2013-09-06 DIAGNOSIS — Z853 Personal history of malignant neoplasm of breast: Secondary | ICD-10-CM | POA: Diagnosis not present

## 2013-09-14 ENCOUNTER — Encounter (HOSPITAL_COMMUNITY): Payer: Medicare Other

## 2013-09-27 ENCOUNTER — Other Ambulatory Visit (HOSPITAL_COMMUNITY): Payer: Self-pay | Admitting: Pulmonary Disease

## 2013-09-27 DIAGNOSIS — K862 Cyst of pancreas: Secondary | ICD-10-CM

## 2013-10-10 DIAGNOSIS — L820 Inflamed seborrheic keratosis: Secondary | ICD-10-CM | POA: Diagnosis not present

## 2013-10-10 DIAGNOSIS — L821 Other seborrheic keratosis: Secondary | ICD-10-CM | POA: Diagnosis not present

## 2013-10-17 ENCOUNTER — Ambulatory Visit (HOSPITAL_COMMUNITY)
Admission: RE | Admit: 2013-10-17 | Discharge: 2013-10-17 | Disposition: A | Discharge status: Home / Self Care | Payer: Medicare Other | Source: Ambulatory Visit | Attending: Pulmonary Disease | Admitting: Pulmonary Disease

## 2013-10-17 DIAGNOSIS — K862 Cyst of pancreas: Secondary | ICD-10-CM

## 2013-11-03 DIAGNOSIS — M899 Disorder of bone, unspecified: Secondary | ICD-10-CM | POA: Diagnosis not present

## 2013-11-03 DIAGNOSIS — Z780 Asymptomatic menopausal state: Secondary | ICD-10-CM | POA: Diagnosis not present

## 2013-11-03 DIAGNOSIS — C50919 Malignant neoplasm of unspecified site of unspecified female breast: Secondary | ICD-10-CM | POA: Diagnosis not present

## 2013-11-03 DIAGNOSIS — Z79899 Other long term (current) drug therapy: Secondary | ICD-10-CM | POA: Diagnosis not present

## 2013-11-03 DIAGNOSIS — M810 Age-related osteoporosis without current pathological fracture: Secondary | ICD-10-CM | POA: Diagnosis not present

## 2013-11-07 DIAGNOSIS — B009 Herpesviral infection, unspecified: Secondary | ICD-10-CM | POA: Diagnosis not present

## 2013-11-07 DIAGNOSIS — C50919 Malignant neoplasm of unspecified site of unspecified female breast: Secondary | ICD-10-CM | POA: Diagnosis not present

## 2013-11-30 DIAGNOSIS — J309 Allergic rhinitis, unspecified: Secondary | ICD-10-CM | POA: Diagnosis not present

## 2013-11-30 DIAGNOSIS — J029 Acute pharyngitis, unspecified: Secondary | ICD-10-CM | POA: Diagnosis not present

## 2013-12-20 DIAGNOSIS — M2010 Hallux valgus (acquired), unspecified foot: Secondary | ICD-10-CM | POA: Diagnosis not present

## 2013-12-20 DIAGNOSIS — M79609 Pain in unspecified limb: Secondary | ICD-10-CM | POA: Diagnosis not present

## 2013-12-22 ENCOUNTER — Emergency Department (HOSPITAL_COMMUNITY)
Admission: EM | Admit: 2013-12-22 | Discharge: 2013-12-22 | Discharge status: Against Medical Advice | Payer: Medicare Other

## 2014-01-05 DIAGNOSIS — D3130 Benign neoplasm of unspecified choroid: Secondary | ICD-10-CM | POA: Diagnosis not present

## 2014-01-05 DIAGNOSIS — H1045 Other chronic allergic conjunctivitis: Secondary | ICD-10-CM | POA: Diagnosis not present

## 2014-01-05 DIAGNOSIS — H5200 Hypermetropia, unspecified eye: Secondary | ICD-10-CM | POA: Diagnosis not present

## 2014-01-05 DIAGNOSIS — H2510 Age-related nuclear cataract, unspecified eye: Secondary | ICD-10-CM | POA: Diagnosis not present

## 2014-02-06 DIAGNOSIS — Z23 Encounter for immunization: Secondary | ICD-10-CM | POA: Diagnosis not present

## 2014-02-06 DIAGNOSIS — F419 Anxiety disorder, unspecified: Secondary | ICD-10-CM | POA: Diagnosis not present

## 2014-02-06 DIAGNOSIS — M5440 Lumbago with sciatica, unspecified side: Secondary | ICD-10-CM | POA: Diagnosis not present

## 2014-02-06 DIAGNOSIS — E119 Type 2 diabetes mellitus without complications: Secondary | ICD-10-CM | POA: Diagnosis not present

## 2014-02-06 DIAGNOSIS — J45909 Unspecified asthma, uncomplicated: Secondary | ICD-10-CM | POA: Diagnosis not present

## 2014-02-09 DIAGNOSIS — M779 Enthesopathy, unspecified: Secondary | ICD-10-CM | POA: Diagnosis not present

## 2014-02-09 DIAGNOSIS — M2022 Hallux rigidus, left foot: Secondary | ICD-10-CM | POA: Diagnosis not present

## 2014-05-02 DIAGNOSIS — C50412 Malignant neoplasm of upper-outer quadrant of left female breast: Secondary | ICD-10-CM | POA: Diagnosis not present

## 2014-05-02 DIAGNOSIS — C50912 Malignant neoplasm of unspecified site of left female breast: Secondary | ICD-10-CM | POA: Diagnosis not present

## 2014-05-02 DIAGNOSIS — M79662 Pain in left lower leg: Secondary | ICD-10-CM | POA: Diagnosis not present

## 2014-05-03 DIAGNOSIS — Z853 Personal history of malignant neoplasm of breast: Secondary | ICD-10-CM | POA: Diagnosis not present

## 2014-05-03 DIAGNOSIS — M79605 Pain in left leg: Secondary | ICD-10-CM | POA: Diagnosis not present

## 2014-05-03 DIAGNOSIS — M79662 Pain in left lower leg: Secondary | ICD-10-CM | POA: Diagnosis not present

## 2014-05-04 ENCOUNTER — Emergency Department (HOSPITAL_COMMUNITY)
Admission: EM | Admit: 2014-05-04 | Discharge: 2014-05-04 | Disposition: A | Discharge status: Home / Self Care | Payer: Medicare Other | Attending: Emergency Medicine | Admitting: Emergency Medicine

## 2014-05-04 ENCOUNTER — Encounter (HOSPITAL_COMMUNITY): Payer: Self-pay | Admitting: *Deleted

## 2014-05-04 ENCOUNTER — Emergency Department (HOSPITAL_COMMUNITY): Payer: Medicare Other

## 2014-05-04 DIAGNOSIS — R079 Chest pain, unspecified: Secondary | ICD-10-CM | POA: Insufficient documentation

## 2014-05-04 DIAGNOSIS — K219 Gastro-esophageal reflux disease without esophagitis: Secondary | ICD-10-CM | POA: Diagnosis not present

## 2014-05-04 DIAGNOSIS — M791 Myalgia: Secondary | ICD-10-CM | POA: Insufficient documentation

## 2014-05-04 DIAGNOSIS — Z79899 Other long term (current) drug therapy: Secondary | ICD-10-CM | POA: Diagnosis not present

## 2014-05-04 DIAGNOSIS — Z853 Personal history of malignant neoplasm of breast: Secondary | ICD-10-CM | POA: Insufficient documentation

## 2014-05-04 DIAGNOSIS — R12 Heartburn: Secondary | ICD-10-CM | POA: Diagnosis present

## 2014-05-04 DIAGNOSIS — J45909 Unspecified asthma, uncomplicated: Secondary | ICD-10-CM | POA: Diagnosis not present

## 2014-05-04 DIAGNOSIS — J029 Acute pharyngitis, unspecified: Secondary | ICD-10-CM | POA: Diagnosis not present

## 2014-05-04 DIAGNOSIS — Z7951 Long term (current) use of inhaled steroids: Secondary | ICD-10-CM | POA: Insufficient documentation

## 2014-05-04 DIAGNOSIS — Z8659 Personal history of other mental and behavioral disorders: Secondary | ICD-10-CM | POA: Diagnosis not present

## 2014-05-04 DIAGNOSIS — E119 Type 2 diabetes mellitus without complications: Secondary | ICD-10-CM | POA: Diagnosis not present

## 2014-05-04 DIAGNOSIS — Z87442 Personal history of urinary calculi: Secondary | ICD-10-CM | POA: Diagnosis not present

## 2014-05-04 LAB — CBC WITH DIFFERENTIAL/PLATELET
BASOS PCT: 0 % (ref 0–1)
Basophils Absolute: 0 10*3/uL (ref 0.0–0.1)
Eosinophils Absolute: 0.1 10*3/uL (ref 0.0–0.7)
Eosinophils Relative: 2 % (ref 0–5)
HCT: 39.4 % (ref 36.0–46.0)
Hemoglobin: 12.8 g/dL (ref 12.0–15.0)
LYMPHS PCT: 45 % (ref 12–46)
Lymphs Abs: 3.4 10*3/uL (ref 0.7–4.0)
MCH: 30 pg (ref 26.0–34.0)
MCHC: 32.5 g/dL (ref 30.0–36.0)
MCV: 92.3 fL (ref 78.0–100.0)
Monocytes Absolute: 0.6 10*3/uL (ref 0.1–1.0)
Monocytes Relative: 8 % (ref 3–12)
NEUTROS PCT: 45 % (ref 43–77)
Neutro Abs: 3.4 10*3/uL (ref 1.7–7.7)
Platelets: 248 10*3/uL (ref 150–400)
RBC: 4.27 MIL/uL (ref 3.87–5.11)
RDW: 12.5 % (ref 11.5–15.5)
WBC: 7.6 10*3/uL (ref 4.0–10.5)

## 2014-05-04 LAB — COMPREHENSIVE METABOLIC PANEL
ALT: 15 U/L (ref 0–35)
ANION GAP: 7 (ref 5–15)
AST: 16 U/L (ref 0–37)
Albumin: 3.9 g/dL (ref 3.5–5.2)
Alkaline Phosphatase: 73 U/L (ref 39–117)
BILIRUBIN TOTAL: 0.8 mg/dL (ref 0.3–1.2)
BUN: 22 mg/dL (ref 6–23)
CHLORIDE: 100 meq/L (ref 96–112)
CO2: 26 mmol/L (ref 19–32)
Calcium: 9.7 mg/dL (ref 8.4–10.5)
Creatinine, Ser: 0.89 mg/dL (ref 0.50–1.10)
GFR, EST AFRICAN AMERICAN: 73 mL/min — AB (ref >90)
GFR, EST NON AFRICAN AMERICAN: 63 mL/min — AB (ref >90)
Glucose, Bld: 176 mg/dL — ABNORMAL HIGH (ref 70–99)
Potassium: 3.7 mmol/L (ref 3.5–5.1)
Sodium: 133 mmol/L — ABNORMAL LOW (ref 135–145)
TOTAL PROTEIN: 7.4 g/dL (ref 6.0–8.3)

## 2014-05-04 LAB — TROPONIN I

## 2014-05-04 MED ORDER — GI COCKTAIL ~~LOC~~
30.0000 mL | Freq: Once | ORAL | Status: AC
  Administered 2014-05-04: 30 mL via ORAL
  Filled 2014-05-04: qty 30

## 2014-05-04 MED ORDER — OMEPRAZOLE 20 MG PO CPDR: 20.0000 mg | DELAYED_RELEASE_CAPSULE | Freq: Every day | ORAL | Status: DC

## 2014-05-04 NOTE — ED Notes (Signed)
Pt ambulated to restroom & returned to room w/ no complications. 

## 2014-05-04 NOTE — ED Notes (Signed)
Pt with chronic heartburn, states that her throat has been burning for several days esp with burping

## 2014-05-04 NOTE — ED Notes (Signed)
Pt reports heartburn for the past 3 days, none of pt medications have helped/ pt w/ hx of reflux in the past But was taking off her medication when she lost weight. Pt states she has put the weight back on.

## 2014-05-04 NOTE — ED Notes (Signed)
Pt alert & oriented x4, stable gait. Patient given discharge instructions, paperwork & prescription(s). Patient  instructed to stop at the registration desk to finish any additional paperwork. Patient verbalized understanding. Pt left department w/ no further questions. 

## 2014-05-04 NOTE — Discharge Instructions (Signed)
Chest Pain (Nonspecific) °It is often hard to give a specific diagnosis for the cause of chest pain. There is always a chance that your pain could be related to something serious, such as a heart attack or a blood clot in the lungs. You need to follow up with your health care provider for further evaluation. °CAUSES  °· Heartburn. °· Pneumonia or bronchitis. °· Anxiety or stress. °· Inflammation around your heart (pericarditis) or lung (pleuritis or pleurisy). °· A blood clot in the lung. °· A collapsed lung (pneumothorax). It can develop suddenly on its own (spontaneous pneumothorax) or from trauma to the chest. °· Shingles infection (herpes zoster virus). °The chest wall is composed of bones, muscles, and cartilage. Any of these can be the source of the pain. °· The bones can be bruised by injury. °· The muscles or cartilage can be strained by coughing or overwork. °· The cartilage can be affected by inflammation and become sore (costochondritis). °DIAGNOSIS  °Lab tests or other studies may be needed to find the cause of your pain. Your health care provider may have you take a test called an ambulatory electrocardiogram (ECG). An ECG records your heartbeat patterns over a 24-hour period. You may also have other tests, such as: °· Transthoracic echocardiogram (TTE). During echocardiography, sound waves are used to evaluate how blood flows through your heart. °· Transesophageal echocardiogram (TEE). °· Cardiac monitoring. This allows your health care provider to monitor your heart rate and rhythm in real time. °· Holter monitor. This is a portable device that records your heartbeat and can help diagnose heart arrhythmias. It allows your health care provider to track your heart activity for several days, if needed. °· Stress tests by exercise or by giving medicine that makes the heart beat faster. °TREATMENT  °· Treatment depends on what may be causing your chest pain. Treatment may include: °· Acid blockers for  heartburn. °· Anti-inflammatory medicine. °· Pain medicine for inflammatory conditions. °· Antibiotics if an infection is present. °· You may be advised to change lifestyle habits. This includes stopping smoking and avoiding alcohol, caffeine, and chocolate. °· You may be advised to keep your head raised (elevated) when sleeping. This reduces the chance of acid going backward from your stomach into your esophagus. °Most of the time, nonspecific chest pain will improve within 2-3 days with rest and mild pain medicine.  °HOME CARE INSTRUCTIONS  °· If antibiotics were prescribed, take them as directed. Finish them even if you start to feel better. °· For the next few days, avoid physical activities that bring on chest pain. Continue physical activities as directed. °· Do not use any tobacco products, including cigarettes, chewing tobacco, or electronic cigarettes. °· Avoid drinking alcohol. °· Only take medicine as directed by your health care provider. °· Follow your health care provider's suggestions for further testing if your chest pain does not go away. °· Keep any follow-up appointments you made. If you do not go to an appointment, you could develop lasting (chronic) problems with pain. If there is any problem keeping an appointment, call to reschedule. °SEEK MEDICAL CARE IF:  °· Your chest pain does not go away, even after treatment. °· You have a rash with blisters on your chest. °· You have a fever. °SEEK IMMEDIATE MEDICAL CARE IF:  °· You have increased chest pain or pain that spreads to your arm, neck, jaw, back, or abdomen. °· You have shortness of breath. °· You have an increasing cough, or you cough   up blood. °· You have severe back or abdominal pain. °· You feel nauseous or vomit. °· You have severe weakness. °· You faint. °· You have chills. °This is an emergency. Do not wait to see if the pain will go away. Get medical help at once. Call your local emergency services (911 in [place] Do not drive  yourself to the hospital. °MAKE SURE YOU:  °· Understand these instructions. °· Will watch your condition. °· Will get help right away if you are not doing well or get worse. °Document Released: 01/22/2005 Document Revised: 04/19/2013 Document Reviewed: 11/18/2007 °ExitCare® Patient Information ©2015 [employer]. This information is not intended to replace advice given to you by your health care provider. Make sure you discuss any questions you have with your health care provider. °Gastroesophageal Reflux Disease, Adult °Gastroesophageal reflux disease (GERD) happens when acid from your stomach flows up into the esophagus. When acid comes in contact with the esophagus, the acid causes soreness (inflammation) in the esophagus. Over time, GERD may create small holes (ulcers) in the lining of the esophagus. °CAUSES  °· Increased body weight. This puts pressure on the stomach, making acid rise from the stomach into the esophagus. °· Smoking. This increases acid production in the stomach. °· Drinking alcohol. This causes decreased pressure in the lower esophageal sphincter (valve or ring of muscle between the esophagus and stomach), allowing acid from the stomach into the esophagus. °· Late evening meals and a full stomach. This increases pressure and acid production in the stomach. °· A malformed lower esophageal sphincter. °Sometimes, no cause is found. °SYMPTOMS  °· Burning pain in the lower part of the mid-chest behind the breastbone and in the mid-stomach area. This may occur twice a week or more often. °· Trouble swallowing. °· Sore throat. °· Dry cough. °· Asthma-like symptoms including chest tightness, shortness of breath, or wheezing. °DIAGNOSIS  °Your caregiver may be able to diagnose GERD based on your symptoms. In some cases, X-rays and other tests may be done to check for complications or to check the condition of your stomach and esophagus. °TREATMENT  °Your caregiver may recommend over-the-counter or  prescription medicines to help decrease acid production. Ask your caregiver before starting or adding any new medicines.  °HOME CARE INSTRUCTIONS  °· Change the factors that you can control. Ask your caregiver for guidance concerning weight loss, quitting smoking, and alcohol consumption. °· Avoid foods and drinks that make your symptoms worse, such as: °¨ Caffeine or alcoholic drinks. °¨ Chocolate. °¨ Peppermint or mint flavorings. °¨ Garlic and onions. °¨ Spicy foods. °¨ Citrus fruits, such as oranges, lemons, or limes. °¨ Tomato-based foods such as sauce, chili, salsa, and pizza. °¨ Fried and fatty foods. °· Avoid lying down for the 3 hours prior to your bedtime or prior to taking a nap. °· Eat small, frequent meals instead of large meals. °· Wear loose-fitting clothing. Do not wear anything tight around your waist that causes pressure on your stomach. °· Raise the head of your bed 6 to 8 inches with wood blocks to help you sleep. Extra pillows will not help. °· Only take over-the-counter or prescription medicines for pain, discomfort, or fever as directed by your caregiver. °· Do not take aspirin, ibuprofen, or other nonsteroidal anti-inflammatory drugs (NSAIDs). °SEEK IMMEDIATE MEDICAL CARE IF:  °· You have pain in your arms, neck, jaw, teeth, or back. °· Your pain increases or changes in intensity or duration. °· You develop nausea, vomiting, or sweating (diaphoresis). °·   You develop shortness of breath, or you faint. °· Your vomit is green, yellow, black, or looks like coffee grounds or blood. °· Your stool is red, bloody, or black. °These symptoms could be signs of other problems, such as heart disease, gastric bleeding, or esophageal bleeding. °MAKE SURE YOU:  °· Understand these instructions. °· Will watch your condition. °· Will get help right away if you are not doing well or get worse. °Document Released: 01/22/2005 Document Revised: 07/07/2011 Document Reviewed: 11/01/2010 °ExitCare® Patient  Information ©2015 [employer]. This information is not intended to replace advice given to you by your health care provider. Make sure you discuss any questions you have with your health care provider. ° °

## 2014-05-04 NOTE — ED Provider Notes (Signed)
CSN: [id number]     Arrival date & time 05/04/14  2103 History  This chart was scribed for [employer]. Alvino Chapel, MD by Delphia Grates, ED Scribe. This patient was seen in room APA18/APA18 and the patient's care was started at 9:35 PM.   Chief Complaint  Patient presents with  . Gastrophageal Reflux    The history is provided by the patient. No language interpreter was used.     HPI Comments: Betty Nguyen is a 74 y.o. female, with history of GERD, who presents to the Emergency Department complaining of constant, worsening heart burn for the past 3 days. Patient describes this as "fire" or "furnance" in her throat. There is associated pain to the bilateral shoulders. She notes hsitory of same, but states this is worse. She reports that when she did experience an episode that was close in severity, she had to be evbaluated at the ED and was prescribed at heartburn medication. She reports that she was later taken off this medication after losing weight. She states eating and drinking makes the pain worse. She also notes that when she burps, it "feels like fire". Patient has tried Mylanta, Tums, and Pepto Bismol without significant improvement. She denies fever, cough, nausea, vomiting, or SOB. She denies any significant cardiac history.  Patient also notes left leg pain and states she has to ambulated with a cane or walker due to the pain. She reports she had an US performed yesterday for this, however, the results were negative for PE/DVT.  No PE/DVT   Past Medical History  Diagnosis Date  . Breast carcinoma 08/2009    Invasive ductal; left; lumpectomy and sentinel node excision  . Asthma   . Nephrolithiasis   . Gastroesophageal reflux   . Diverticulitis 2012    Treated medically  . Chest pain   . Anxiety and depression   . Hyperlipidemia   . Diabetes mellitus type II, controlled     diet controlled,exercise, no meds  . Depression   . Anxiety    Past Surgical History  Procedure  Laterality Date  . Cesarean section    . Tubal ligation    . Nephrolithotomy    . Dilation and curettage of uterus    . Colonoscopy  2004  . Breast lumpectomy      Left   Family History  Problem Relation Age of Onset  . Coronary artery disease Mother   . Stroke Father   . Diabetes Mellitus II Father   . Colon cancer Neg Hx    History  Substance Use Topics  . Smoking status: Never Smoker   . Smokeless tobacco: Never Used  . Alcohol Use: No   OB History    No data available     Review of Systems  Constitutional: Negative for fever.  HENT: Positive for sore throat (burning).   Respiratory: Negative for cough and shortness of breath.   Gastrointestinal: Negative for nausea and vomiting.       GERD symptoms  Musculoskeletal: Positive for myalgias.      Allergies  Biaxin; Codeine; Metronidazole; and Cephalosporins  Home Medications   Prior to Admission medications   Medication Sig Start Date End Date Taking? Authorizing Provider  calcium-vitamin D (OSCAL WITH D) 500-200 MG-UNIT per tablet Take 1 tablet by mouth 2 (two) times daily.   Yes Historical Provider, MD  ciclesonide (ALVESCO) 160 MCG/ACT inhaler Inhale 1 puff into the lungs 2 (two) times daily.   Yes Historical Provider, MD  magnesium  oxide (MAG-OX) 400 MG tablet Take 400 mg by mouth daily.   Yes Historical Provider, MD  albuterol (PROVENTIL HFA;VENTOLIN HFA) 108 (90 BASE) MCG/ACT inhaler Inhale 2 puffs into the lungs every 6 (six) hours as needed for wheezing.    Historical Provider, MD  anastrozole (ARIMIDEX) 1 MG tablet Take 1 mg by mouth daily.    Historical Provider, MD  omeprazole (PRILOSEC) 20 MG capsule Take 1 capsule (20 mg total) by mouth daily. 05/04/14   Jasper Riling. Shaima Sardinas, MD  valACYclovir (VALTREX) 500 MG tablet Take 500 mg by mouth daily as needed (outbreak).     Historical Provider, MD   Triage Vitals: BP 145/75 mmHg  Pulse 78  Temp(Src) 97.6 F (36.4 C) (Oral)  Resp 20  Ht 5' 1.25" (1.556 m)   Wt 174 lb (78.926 kg)  BMI 32.60 kg/m2  SpO2 97%  Physical Exam  Constitutional: She is oriented to person, place, and time. She appears well-developed and well-nourished. No distress.  HENT:  Head: Normocephalic and atraumatic.  Eyes: Conjunctivae and EOM are normal.  Neck: Neck supple. No JVD present. No tracheal deviation present.  Cardiovascular: Normal rate, regular rhythm and normal heart sounds.   Pulmonary/Chest: Effort normal and breath sounds normal. No respiratory distress. She exhibits no tenderness.  Musculoskeletal: Normal range of motion. She exhibits no edema.  No peripheral edema  Neurological: She is alert and oriented to person, place, and time.  Skin: Skin is warm and dry.  Psychiatric: She has a normal mood and affect. Her behavior is normal.  Nursing note and vitals reviewed.   ED Course  Procedures (including critical care time)   COORDINATION OF CARE: At 2138 Discussed treatment plan with patient which includes GI cocktail. Patient agrees.   Results for orders placed or performed during the hospital encounter of 05/04/14  CBC with Differential  Result Value Ref Range   WBC 7.6 4.0 - 10.5 K/uL   RBC 4.27 3.87 - 5.11 MIL/uL   Hemoglobin 12.8 12.0 - 15.0 g/dL   HCT 39.4 36.0 - 46.0 %   MCV 92.3 78.0 - 100.0 fL   MCH 30.0 26.0 - 34.0 pg   MCHC 32.5 30.0 - 36.0 g/dL   RDW 12.5 11.5 - 15.5 %   Platelets 248 150 - 400 K/uL   Neutrophils Relative % 45 43 - 77 %   Neutro Abs 3.4 1.7 - 7.7 K/uL   Lymphocytes Relative 45 12 - 46 %   Lymphs Abs 3.4 0.7 - 4.0 K/uL   Monocytes Relative 8 3 - 12 %   Monocytes Absolute 0.6 0.1 - 1.0 K/uL   Eosinophils Relative 2 0 - 5 %   Eosinophils Absolute 0.1 0.0 - 0.7 K/uL   Basophils Relative 0 0 - 1 %   Basophils Absolute 0.0 0.0 - 0.1 K/uL  Comprehensive metabolic panel  Result Value Ref Range   Sodium 133 (L) 135 - 145 mmol/L   Potassium 3.7 3.5 - 5.1 mmol/L   Chloride 100 96 - 112 mEq/L   CO2 26 19 - 32 mmol/L    Glucose, Bld 176 (H) 70 - 99 mg/dL   BUN 22 6 - 23 mg/dL   Creatinine, Ser 0.89 0.50 - 1.10 mg/dL   Calcium 9.7 8.4 - 10.5 mg/dL   Total Protein 7.4 6.0 - 8.3 g/dL   Albumin 3.9 3.5 - 5.2 g/dL   AST 16 0 - 37 U/L   ALT 15 0 - 35 U/L   Alkaline  Phosphatase 73 39 - 117 U/L   Total Bilirubin 0.8 0.3 - 1.2 mg/dL   GFR calc non Af Amer 63 (L) >90 mL/min   GFR calc Af Amer 73 (L) >90 mL/min   Anion gap 7 5 - 15  Troponin I  Result Value Ref Range   Troponin I <0.03 <0.031 ng/mL   Imaging Review Dg Chest Portable 1 View  05/04/2014   CLINICAL DATA:  Chest pain.  EXAM: PORTABLE CHEST - 1 VIEW  COMPARISON:  August 21, 2010.  FINDINGS: The heart size and mediastinal contours are within normal limits. Both lungs are clear. No pneumothorax or pleural effusion is noted. The visualized skeletal structures are unremarkable.  IMPRESSION: No acute cardiopulmonary abnormality seen.   Electronically Signed   By: Sabino Dick M.D.   On: 05/04/2014 21:59       EKG Interpretation None      MDM   Final diagnoses:  Chest pain  Gastroesophageal reflux disease, esophagitis presence not specified    Patient with chest pain. History of same but has been GERD. Has been off her medications. EKG reassuring. Enzymes negative. Doubt cardiac cause. May be related to GERD. Will discharge home.  I personally performed the services described in this documentation, which was scribed in my presence. The recorded information has been reviewed and is accurate.    Jasper Riling. Alvino Chapel, MD 05/06/14 0001

## 2014-05-09 DIAGNOSIS — F419 Anxiety disorder, unspecified: Secondary | ICD-10-CM | POA: Diagnosis not present

## 2014-05-09 DIAGNOSIS — E119 Type 2 diabetes mellitus without complications: Secondary | ICD-10-CM | POA: Diagnosis not present

## 2014-05-09 DIAGNOSIS — M545 Low back pain: Secondary | ICD-10-CM | POA: Diagnosis not present

## 2014-05-09 DIAGNOSIS — J449 Chronic obstructive pulmonary disease, unspecified: Secondary | ICD-10-CM | POA: Diagnosis not present

## 2014-05-15 DIAGNOSIS — M1712 Unilateral primary osteoarthritis, left knee: Secondary | ICD-10-CM | POA: Diagnosis not present

## 2014-06-27 DIAGNOSIS — M1712 Unilateral primary osteoarthritis, left knee: Secondary | ICD-10-CM | POA: Diagnosis not present

## 2014-07-13 DIAGNOSIS — J3089 Other allergic rhinitis: Secondary | ICD-10-CM | POA: Diagnosis not present

## 2014-08-08 DIAGNOSIS — M1990 Unspecified osteoarthritis, unspecified site: Secondary | ICD-10-CM | POA: Diagnosis not present

## 2014-08-08 DIAGNOSIS — M5440 Lumbago with sciatica, unspecified side: Secondary | ICD-10-CM | POA: Diagnosis not present

## 2014-08-08 DIAGNOSIS — J45909 Unspecified asthma, uncomplicated: Secondary | ICD-10-CM | POA: Diagnosis not present

## 2014-08-08 DIAGNOSIS — E119 Type 2 diabetes mellitus without complications: Secondary | ICD-10-CM | POA: Diagnosis not present

## 2014-08-14 ENCOUNTER — Telehealth: Payer: Self-pay

## 2014-08-14 DIAGNOSIS — R932 Abnormal findings on diagnostic imaging of liver and biliary tract: Secondary | ICD-10-CM

## 2014-08-14 NOTE — Telephone Encounter (Signed)
-----   Message from Barron Alvine, [place] sent at 08/12/2013  2:30 PM EDT ----- repeat MRI of pancreas in 1 year

## 2014-08-17 NOTE — Telephone Encounter (Signed)
You have been scheduled for an MRI at [facility] on 08/22/14. Your appointment time is 7 am. Please arrive 15 minutes prior to your appointment time for registration purposes. Please make certain not to have anything to eat or drink 6 hours prior to your test. In addition, if you have any metal in your body, have a pacemaker or defibrillator, please be sure to let your ordering physician know. This test typically takes 45 minutes to 1 hour to complete.  Pt has been notified of the MRI appt and instructions.  She will call with any questions or concerns

## 2014-08-22 ENCOUNTER — Ambulatory Visit (HOSPITAL_COMMUNITY)
Admission: RE | Admit: 2014-08-22 | Discharge: 2014-08-22 | Disposition: A | Discharge status: Home / Self Care | Payer: Medicare Other | Source: Ambulatory Visit | Attending: Gastroenterology | Admitting: Gastroenterology

## 2014-08-22 DIAGNOSIS — K862 Cyst of pancreas: Secondary | ICD-10-CM | POA: Diagnosis not present

## 2014-08-22 DIAGNOSIS — R932 Abnormal findings on diagnostic imaging of liver and biliary tract: Secondary | ICD-10-CM | POA: Diagnosis not present

## 2014-08-22 DIAGNOSIS — K7689 Other specified diseases of liver: Secondary | ICD-10-CM | POA: Insufficient documentation

## 2014-08-22 DIAGNOSIS — K869 Disease of pancreas, unspecified: Secondary | ICD-10-CM | POA: Diagnosis not present

## 2014-08-22 LAB — POCT I-STAT CREATININE: Creatinine, Ser: 0.9 mg/dL (ref 0.50–1.10)

## 2014-08-22 MED ORDER — GADOBENATE DIMEGLUMINE 529 MG/ML IV SOLN
20.0000 mL | Freq: Once | INTRAVENOUS | Status: AC | PRN
  Administered 2014-08-22: 16 mL via INTRAVENOUS

## 2014-08-29 ENCOUNTER — Telehealth: Payer: Self-pay

## 2014-08-29 DIAGNOSIS — K862 Cyst of pancreas: Secondary | ICD-10-CM

## 2014-08-29 NOTE — Telephone Encounter (Signed)
Pt said she is returning your call °

## 2014-08-29 NOTE — Telephone Encounter (Signed)
PT HAS BEEN SCHEDULED FOR 09/07/14 1230 PM INSTRUCTIONS HAVE BEEN MAILED TO THE HOME, MESSAGE LEFT FOR PT TO RETURN CALL SO THAT WE CAN DISCUSS

## 2014-08-29 NOTE — Telephone Encounter (Signed)
Notes Recorded by Milus Banister, MD on 08/28/2014 at 7:56 AM  i spoke with her this morning, she understands the cyst has changed, grown somewhat.   Can you call her to set up EUS upper, radial +/- linear, next available EUS Thursday at [facility], ++MAC  thanks

## 2014-08-30 NOTE — Telephone Encounter (Signed)
Left message on machine to call back  

## 2014-08-30 NOTE — Telephone Encounter (Signed)
EUS scheduled, pt instructed and medications reviewed.  Patient instructions mailed to home.  Patient to call with any questions or concerns.  

## 2014-08-31 ENCOUNTER — Encounter (HOSPITAL_COMMUNITY): Payer: Self-pay | Admitting: *Deleted

## 2014-09-07 ENCOUNTER — Ambulatory Visit (HOSPITAL_COMMUNITY): Payer: Medicare Other | Admitting: Anesthesiology

## 2014-09-07 ENCOUNTER — Encounter (HOSPITAL_COMMUNITY): Payer: Self-pay | Admitting: Anesthesiology

## 2014-09-07 ENCOUNTER — Encounter (HOSPITAL_COMMUNITY)
Admission: RE | Disposition: A | Discharge status: Home / Self Care | Payer: Self-pay | Source: Ambulatory Visit | Attending: Gastroenterology

## 2014-09-07 ENCOUNTER — Ambulatory Visit (HOSPITAL_COMMUNITY)
Admission: RE | Admit: 2014-09-07 | Discharge: 2014-09-07 | Disposition: A | Discharge status: Home / Self Care | Payer: Medicare Other | Source: Ambulatory Visit | Attending: Gastroenterology | Admitting: Gastroenterology

## 2014-09-07 DIAGNOSIS — K219 Gastro-esophageal reflux disease without esophagitis: Secondary | ICD-10-CM | POA: Insufficient documentation

## 2014-09-07 DIAGNOSIS — K862 Cyst of pancreas: Secondary | ICD-10-CM | POA: Diagnosis not present

## 2014-09-07 DIAGNOSIS — Z87442 Personal history of urinary calculi: Secondary | ICD-10-CM | POA: Diagnosis not present

## 2014-09-07 DIAGNOSIS — F419 Anxiety disorder, unspecified: Secondary | ICD-10-CM | POA: Diagnosis not present

## 2014-09-07 DIAGNOSIS — Z9851 Tubal ligation status: Secondary | ICD-10-CM | POA: Insufficient documentation

## 2014-09-07 DIAGNOSIS — F329 Major depressive disorder, single episode, unspecified: Secondary | ICD-10-CM | POA: Diagnosis not present

## 2014-09-07 DIAGNOSIS — E119 Type 2 diabetes mellitus without complications: Secondary | ICD-10-CM | POA: Insufficient documentation

## 2014-09-07 DIAGNOSIS — Z881 Allergy status to other antibiotic agents status: Secondary | ICD-10-CM | POA: Insufficient documentation

## 2014-09-07 DIAGNOSIS — Z886 Allergy status to analgesic agent status: Secondary | ICD-10-CM | POA: Insufficient documentation

## 2014-09-07 DIAGNOSIS — K868 Other specified diseases of pancreas: Secondary | ICD-10-CM | POA: Diagnosis not present

## 2014-09-07 DIAGNOSIS — Z853 Personal history of malignant neoplasm of breast: Secondary | ICD-10-CM | POA: Diagnosis not present

## 2014-09-07 DIAGNOSIS — J45909 Unspecified asthma, uncomplicated: Secondary | ICD-10-CM | POA: Insufficient documentation

## 2014-09-07 DIAGNOSIS — E785 Hyperlipidemia, unspecified: Secondary | ICD-10-CM | POA: Insufficient documentation

## 2014-09-07 HISTORY — PX: EUS: SHX5427

## 2014-09-07 LAB — PANC CYST FLD ANLYS-PATHFNDR-TG

## 2014-09-07 SURGERY — UPPER ENDOSCOPIC ULTRASOUND (EUS) LINEAR
Anesthesia: Monitor Anesthesia Care

## 2014-09-07 MED ORDER — KETAMINE HCL 10 MG/ML IJ SOLN
INTRAMUSCULAR | Status: DC | PRN
  Administered 2014-09-07 (×2): 10 mg via INTRAVENOUS

## 2014-09-07 MED ORDER — CIPROFLOXACIN HCL 500 MG PO TABS: 500.0000 mg | ORAL_TABLET | Freq: Two times a day (BID) | ORAL | Status: DC

## 2014-09-07 MED ORDER — LACTATED RINGERS IV SOLN
INTRAVENOUS | Status: DC | PRN
  Administered 2014-09-07: 12:00:00 via INTRAVENOUS

## 2014-09-07 MED ORDER — PROPOFOL 10 MG/ML IV BOLUS
INTRAVENOUS | Status: AC
  Filled 2014-09-07: qty 20

## 2014-09-07 MED ORDER — CIPROFLOXACIN IN D5W 400 MG/200ML IV SOLN
400.0000 mg | Freq: Once | INTRAVENOUS | Status: AC
  Administered 2014-09-07: 400 mg via INTRAVENOUS

## 2014-09-07 MED ORDER — CIPROFLOXACIN IN D5W 400 MG/200ML IV SOLN
INTRAVENOUS | Status: AC
  Filled 2014-09-07: qty 200

## 2014-09-07 MED ORDER — ONDANSETRON HCL 4 MG/2ML IJ SOLN
INTRAMUSCULAR | Status: DC | PRN
  Administered 2014-09-07: 4 mg via INTRAVENOUS

## 2014-09-07 MED ORDER — PROPOFOL INFUSION 10 MG/ML OPTIME
INTRAVENOUS | Status: DC | PRN
  Administered 2014-09-07: 100 ug/kg/min via INTRAVENOUS

## 2014-09-07 NOTE — H&P (Signed)
  HPI: This is a woman with enlarging pancreatic cyst causing mass effect locally  Chief complaint is pancreatic cyst   Past Medical History  Diagnosis Date  . Asthma   . Nephrolithiasis   . Gastroesophageal reflux   . Diverticulitis 2012    Treated medically  . Chest pain   . Anxiety and depression   . Hyperlipidemia   . Diabetes mellitus type II, controlled     diet controlled,exercise, no meds  . Depression   . Anxiety   . Breast carcinoma 08/2009    Invasive ductal; left; lumpectomy and sentinel node excision- oncology visits yearly.    Past Surgical History  Procedure Laterality Date  . Cesarean section    . Tubal ligation    . Nephrolithotomy    . Dilation and curettage of uterus    . Colonoscopy  2004  . Breast lumpectomy      Left    No current facility-administered medications for this encounter.   Facility-Administered Medications Ordered in Other Encounters  Medication Dose Route Frequency Provider Last Rate Last Dose  . lactated ringers infusion    Continuous PRN Kristopher Key, CRNA        Allergies as of 08/29/2014 - Review Complete 05/04/2014  Allergen Reaction Noted  . Biaxin [clarithromycin] Other (See Comments) 11/25/2012  . Codeine Other (See Comments) 08/21/2010  . Metronidazole Other (See Comments) 11/25/2012  . Cephalosporins Hives 11/25/2012    Family History  Problem Relation Age of Onset  . Coronary artery disease Mother   . Stroke Father   . Diabetes Mellitus II Father   . Colon cancer Neg Hx     History   Social History  . Marital Status: Legally Separated    Spouse Name: N/A  . Number of Children: N/A  . Years of Education: N/A   Occupational History  . Not on file.   Social History Main Topics  . Smoking status: Never Smoker   . Smokeless tobacco: Never Used  . Alcohol Use: No  . Drug Use: No  . Sexual Activity: Not on file   Other Topics Concern  . Not on file   Social History Narrative     Physical  Exam: There were no vitals taken for this visit. Constitutional: generally well-appearing Psychiatric: alert and oriented x3 Abdomen: soft, nontender, nondistended, no obvious ascites, no peritoneal signs, normal bowel sounds   Assessment and plan: 74 y.o. female with pancreatic cys  For EUS today   Owens Loffler, MD [facility] 09/07/2014, 11:55 AM

## 2014-09-07 NOTE — Anesthesia Preprocedure Evaluation (Signed)
Anesthesia Evaluation  Patient identified by MRN, date of birth, ID band Patient awake    Airway Mallampati: II  TM Distance: >3 FB Neck ROM: Full    Dental  (+) Teeth Intact   Pulmonary asthma ,    Pulmonary exam normal       Cardiovascular Normal cardiovascular exam    Neuro/Psych PSYCHIATRIC DISORDERS Anxiety Depression    GI/Hepatic GERD-  Medicated and Controlled,  Endo/Other  diabetes, Well Controlled  Renal/GU      Musculoskeletal   Abdominal Normal abdominal exam  (+)   Peds  Hematology   Anesthesia Other Findings Borderline DM, Glu 133  Reproductive/Obstetrics                             Anesthesia Physical Anesthesia Plan  ASA: III  Anesthesia Plan: MAC   Post-op Pain Management:    Induction: Intravenous  Airway Management Planned: Nasal Cannula  Additional Equipment:   Intra-op Plan:   Post-operative Plan:   Informed Consent: I have reviewed the patients History and Physical, chart, labs and discussed the procedure including the risks, benefits and alternatives for the proposed anesthesia with the patient or authorized representative who has indicated his/her understanding and acceptance.   Dental advisory given  Plan Discussed with: CRNA, Anesthesiologist and Surgeon  Anesthesia Plan Comments:         Anesthesia Quick Evaluation

## 2014-09-07 NOTE — Discharge Instructions (Signed)

## 2014-09-07 NOTE — Op Note (Signed)
[place], [id number]   ENDOSCOPIC ULTRASOUND PROCEDURE REPORT  PATIENT: Betty Nguyen, Betty Nguyen  MR#: [id number] BIRTHDATE: September 28, 1940  GENDER: female ENDOSCOPIST: Milus Banister, MD PROCEDURE DATE:  09/07/2014 PROCEDURE:   Upper EUS w/FNA ASA CLASS:      Class III INDICATIONS:   multiple pancreatic cysts, one has enlarged and may be causing mass effect on vessel. MEDICATIONS: Monitored anesthesia care, cipro 400mg  IV  DESCRIPTION OF PROCEDURE:   After the risks benefits and alternatives of the procedure were  explained, informed consent was obtained. The patient was then placed in the left, lateral, decubitus postion and IV sedation was administered. Throughout the procedure, the patients blood pressure, pulse and oxygen saturations were monitored continuously.  Under direct visualization, the Pentax Radial EUS [id number]  endoscope was introduced through the mouth  and advanced to the second portion of the duodenum .  Water was used as necessary to provide an acoustic interface.  Upon completion of the imaging, water was removed and the patient was sent to the recovery room in satisfactory condition.  Endoscopic findings: 1. Normal UGI tract  EUS findings: 1. There were 10-15 cysts in the pancreas, spread throughout from head to tail.  All appeared anechoic and I did not detect any associated masses or nodules.  I could not clearly visualize the main pancreatic duct through all of the cysts. The largest cyst was 4-5cm across, was located in head/uncinate and was sampled with a single transduodenal pass with a 22 gauge EUS FNA needle, suction. This yielded 7 cc of clear, thin fluid. 2. CBD was normal, non-dilated 3. No peripancreatic adenopathy 4. Limited views of liver, spleen, portal and splenic vessels were all normal.  ENDOSCOPIC IMPRESSION: Multiple cysts throughout the pancreas.  The largest, located in head/uncinate was sampled with  FNA.  Cyst fluid was sent for CEA, amylase and cytology.  RECOMMENDATIONS: Await final cyst fluid tests.  I do not think she has pancreatic cancer.  She will complete 3 days of cipro twice daily.   _______________________________ eSignedMilus Banister, MD 09/07/2014 1:09 PM

## 2014-09-07 NOTE — Anesthesia Postprocedure Evaluation (Signed)
Anesthesia Post Note  Patient: Betty Nguyen  Procedure(s) Performed: Procedure(s) (LRB): UPPER ENDOSCOPIC ULTRASOUND (EUS) LINEAR (N/A)  Anesthesia type: MAC  Patient location: PACU  Post pain: Pain level controlled  Post assessment: Patient's Cardiovascular Status Stable  Last Vitals:  Filed Vitals:   09/07/14 1307  BP: 186/63  Pulse:   Temp:   Resp: 19    Post vital signs: Reviewed and stable  Level of consciousness: sedated  Complications: No apparent anesthesia complications

## 2014-09-07 NOTE — Transfer of Care (Signed)
Immediate Anesthesia Transfer of Care Note  Patient: Betty Nguyen  Procedure(s) Performed: Procedure(s): UPPER ENDOSCOPIC ULTRASOUND (EUS) LINEAR (N/A)  Patient Location: PACU  Anesthesia Type:MAC  Level of Consciousness:  sedated, patient cooperative and responds to stimulation  Airway & Oxygen Therapy:Patient Spontanous Breathing and Patient connected to face mask oxgen  Post-op Assessment:  Report given to PACU RN and Post -op Vital signs reviewed and stable  Post vital signs:  Reviewed and stable  Last Vitals:  Filed Vitals:   09/07/14 1214  BP: 158/69  Pulse: 79  Temp: 36.5 C  Resp: 24    Complications: No apparent anesthesia complications

## 2014-09-08 ENCOUNTER — Other Ambulatory Visit (HOSPITAL_COMMUNITY): Payer: Self-pay | Admitting: Pulmonary Disease

## 2014-09-08 ENCOUNTER — Encounter (HOSPITAL_COMMUNITY): Payer: Self-pay | Admitting: Gastroenterology

## 2014-09-08 DIAGNOSIS — Z853 Personal history of malignant neoplasm of breast: Secondary | ICD-10-CM

## 2014-09-09 ENCOUNTER — Emergency Department (HOSPITAL_COMMUNITY)
Admission: EM | Admit: 2014-09-09 | Discharge: 2014-09-09 | Disposition: A | Discharge status: Home / Self Care | Payer: Medicare Other | Attending: Emergency Medicine | Admitting: Emergency Medicine

## 2014-09-09 ENCOUNTER — Encounter (HOSPITAL_COMMUNITY): Payer: Self-pay | Admitting: Emergency Medicine

## 2014-09-09 DIAGNOSIS — F329 Major depressive disorder, single episode, unspecified: Secondary | ICD-10-CM | POA: Insufficient documentation

## 2014-09-09 DIAGNOSIS — K5900 Constipation, unspecified: Secondary | ICD-10-CM | POA: Diagnosis not present

## 2014-09-09 DIAGNOSIS — Z853 Personal history of malignant neoplasm of breast: Secondary | ICD-10-CM | POA: Diagnosis not present

## 2014-09-09 DIAGNOSIS — J45909 Unspecified asthma, uncomplicated: Secondary | ICD-10-CM | POA: Diagnosis not present

## 2014-09-09 DIAGNOSIS — E119 Type 2 diabetes mellitus without complications: Secondary | ICD-10-CM | POA: Diagnosis not present

## 2014-09-09 DIAGNOSIS — F419 Anxiety disorder, unspecified: Secondary | ICD-10-CM | POA: Insufficient documentation

## 2014-09-09 DIAGNOSIS — Z87442 Personal history of urinary calculi: Secondary | ICD-10-CM | POA: Diagnosis not present

## 2014-09-09 DIAGNOSIS — K219 Gastro-esophageal reflux disease without esophagitis: Secondary | ICD-10-CM | POA: Insufficient documentation

## 2014-09-09 DIAGNOSIS — Z79899 Other long term (current) drug therapy: Secondary | ICD-10-CM | POA: Insufficient documentation

## 2014-09-09 NOTE — Discharge Instructions (Signed)
°Emergency Department Resource Guide °1) Find a Doctor and Pay Out of Pocket °Although you won't have to find out who is covered by your insurance plan, it is a good idea to ask around and get recommendations. You will then need to call the office and see if the doctor you have chosen will accept you as a new patient and what types of options they offer for patients who are self-pay. Some doctors offer discounts or will set up payment plans for their patients who do not have insurance, but you will need to ask so you aren't surprised when you get to your appointment. ° °2) Contact Your Local Health Department °Not all health departments have doctors that can see patients for sick visits, but many do, so it is worth a call to see if yours does. If you don't know where your local health department is, you can check in your phone book. The CDC also has a tool to help you locate your state's health department, and many state websites also have listings of all of their local health departments. ° °3) Find a Walk-in Clinic °If your illness is not likely to be very severe or complicated, you may want to try a walk in clinic. These are popping up all over the country in pharmacies, drugstores, and shopping centers. They're usually staffed by nurse practitioners or physician assistants that have been trained to treat common illnesses and complaints. They're usually fairly quick and inexpensive. However, if you have serious medical issues or chronic medical problems, these are probably not your best option. ° °No Primary Care Doctor: °- Call [facility] at  832-8000 - they can help you locate a primary care doctor that  accepts your insurance, provides certain services, etc. °- Physician Referral Service- 1-800-533-3463 ° °Chronic Pain Problems: °Organization         Address  Phone   Notes  ° [facility]  (336) 297-2271 Patients need to be referred by their primary care doctor.  ° °Medication  Assistance: °Organization         Address  Phone   Notes  °[place] [facility] [address] °[place], [place] [address] (336) 641-8030 --Must be a resident of [place] °-- Must have NO insurance coverage whatsoever (no Medicaid/ Medicare, etc.) °-- The pt. MUST have a primary care doctor that directs their care regularly and follows them in the community °  °[facility]  (866) 331-1348   °[employer]  (888) 892-1162   ° °Agencies that provide inexpensive medical care: °Organization         Address  Phone   Notes  °[facility]  (336) 832-8035   ° Internal Medicine    (336) 832-7272   °[facility] [address] °[place], [place] [address] (336) 832-4777   °[facility] [address], °[place] (336) 271-4999   °[facility]    (336) 373-0678   °[facility]    (336) 272-1050   °[facility] ° [address], [place] Phone:  (336) 832-4444, Fax:  (336) 832-4440 Hours of Operation:  9 am - 6 pm, M-F.  Also accepts Medicaid/Medicare and self-pay.  °[facility] ° [address], [place] Phone: (336) 832-3150, Fax: (336) 832-3151. Hours of Operation:  8:30 am - 5:30 pm, M-F.  Also accepts Medicaid and self-pay.  °[facility] [place] [address], [place] Phone: (336) 878-6027   °[facility] [address], [place], [place] (336)723-1848, Ext. 123 Mondays & Thursdays: 7-9 AM.  First 15 patients are seen on a first come, first serve basis. °  ° °Medicaid-accepting [place] Providers: ° °Organization         Address  Phone   Notes  °[facility] [address], [place] (336) 641-2100 Also accepts self-pay patients.  °[facility] [address], [place] ° (336) 856-9996   °[facility] [address], [place]  (336) 288-8857   °[facility] [address] [place] [address], [place] (336) 299-7000   °Veita Bland [address], [place]  ° (336) 373-1557 Only accepts [place] Access Medicaid patients after they have their name applied to their card.  ° °Self-Pay (no insurance) in [place]: ° °Organization         Address  Phone   Notes  °Sickle Cell Patients, [facility] [address], [place] (336) 832-1970   °[facility] [address], [place] (336) 832-4400   °[facility] ° [address], [place] (336) 992-4800   °[facility]/Dr. Osei-Bonsu ° [address] [place] [address], [place] or [address], [place] (336) 841-8500 Phone number for both [place] and [place] locations is the same.  °[facility] [address], [place] (336) 299-0000   °[facility] [address] [place] [address], [place] or [address] (336) 852-7530 °(336) 878-2260   °[facility] [address], [place] (336) 350-1642, phone; (336) 294-5005, fax Sees patients 1st and 3rd Saturday of every month.  Must not qualify for public or private insurance (i.e. Medicaid, Medicare, [place] Health Choice, Veterans' Benefits) • Household income should be no more than 200% of the poverty level •The clinic cannot treat you if you are pregnant or think you are pregnant • Sexually transmitted diseases are not treated at the clinic.  ° ° °Dental Care: °Organization         Address  Phone  Notes  °[place] [facility] [address], [place] (336) 641-6152 Accepts children up to age 21 who are enrolled in Medicaid or [place] Health Choice; pregnant women with a Medicaid card; and children who have applied for Medicaid or [place] Health Choice, but were declined, whose parents can pay a reduced fee at time of service.  °[place]  [facility] [place]  [address], [place] (336) 641-7733 Accepts children up to age 21 who are enrolled in Medicaid or [place] Health Choice; pregnant women with a Medicaid card; and children who have applied for Medicaid or [place] Health Choice, but were declined, whose parents can pay a reduced fee at time of service.  °[facility] ° [address],  (336) 641-4533 Patients are seen by appointment only. Walk-ins are not accepted. [facility] will see patients 18 years of age and older. °Monday - Tuesday (8am-5pm) °Most Wednesdays (8:30-5pm) °$30 per visit, cash only  °[facility] ° [address], [place] (336) 641-4533 Patients are seen by appointment only. Walk-ins are not accepted. [facility] will see patients 18 years of age and older. °One   Wednesday Evening (Monthly: Volunteer Based).  $30 per visit, cash only  °[facility]  (919) 537-3737 for adults; Children under age 4, call Graduate Pediatric Dentistry at (919) [phone]. Children aged 4-14, please call (919) [phone] to request a pediatric application. ° Dental services are provided in all areas of dental care including fillings, crowns and bridges, complete and partial dentures, implants, gum treatment, root canals, and extractions. Preventive care is also provided. Treatment is provided to both adults and children. °Patients are selected via a lottery and there is often a waiting list. °  °[facility] [address], °[place] ° (336) 763-8833 [email or web address] °  °[facility] [address], [place], [place] (336)723-1848, Ext. 123 Second and Fourth Thursday of each month, opens at 6:30 AM; Clinic ends at 9 AM.  Patients are seen on a first-come first-served basis, and a limited number are seen during each clinic.  ° °[facility] ° [address], [place], [place] (336) 723-7904    Eligibility Requirements °You must have lived in [place], or [place] counties for at least the last three months. °  You cannot be eligible for state or federal sponsored healthcare insurance, including Veterans Administration, Medicaid, or Medicare. °  You generally cannot be eligible for healthcare insurance through your employer.  °  How to apply: °Eligibility screenings are held every Tuesday and Wednesday afternoon from 1:00 pm until 4:00 pm. You do not need an appointment for the interview!  °[facility] [address], [place], [place] 336-631-2330   °[facility]  336-342-8273   °[place]  336-703-3100   °[facility]  336-570-6415   ° °Behavioral Health Resources in the Community: °Intensive Outpatient Programs °Organization         Address  Phone  Notes  °[facility] [address], [place], [place] 336-878-6098   °[facility] [address], [place], [place] 336-832-9800   °[facility] [address], [place], [place] ° 336-882-2125   °[facility] [address],  °[place], [place] 1-800-853-5163 or 336-641-4981   °Substance Abuse Resources °Organization         Address  Phone  Notes  °[facility]  336-882-2125   °[facility]  336-784-9470   °[facility]  336-285-9073   °[facility]  336-845-3988   °Residential & Outpatient Substance Abuse Program  1-800-659-3381   °Psychological Services °Organization         Address  Phone  Notes  °[facility]  336- 832-9600   °[facility]  336- 378-7881   °[facility] [address], [place] 1-800-853-5163 or 336-641-4981   ° °Mobile Crisis Teams °Organization         Address  Phone  Notes  °[facility], Mobile Crisis Care Unit  1-877-626-1772   °Assertive °[facility] ° [address]  [place], [place] 336-834-9664   °Sharon DeEsch [address] °[place] 336-554-5454   ° °Self-Help/Support Groups °Organization         Address  Phone             Notes  °[facility] [place] - variety of support groups  336- 373-1402 Call for more information  °Narcotics Anonymous (NA), [facility] [address], °[place]  2 meetings at this location  ° °  Residential Treatment Programs Organization         Address  Phone  Notes  [facility] [address]    [place]  1-212-552-5384   [facility]  [address], [place] [address], [place], [place]   [place], [place] 305-340-9041 Admissions: 8am-3pm M-F  [facility] [place] [address],    [place], [place] [phone]   [facility] [address] [place], [place], [place]   [facility] [address],  [place], [place]   [facility] - Intensive Outpatient [address], [place], [place]   [facility] [address]  [place], [place] 1-854-496-6971 or (539)318-8004   [facility] [address] [place] [address], [place], [place] Accepts Medicaid  [facility] [address]  [place] 1-(838) 263-2154 Substance Abuse/Addiction Treatment   [facility] Organization         Address  Phone  Notes  [facility]  [phone]   Domenic Schwab, PhD [address] Arlis [place], [place]   612-575-6106 or (754)[phone]   [facility] [place], [place] (760)256-3553   [facility] [address] [place] [address], [place], [place] 430-141-5294 Insurance/Medicaid/sponsorship through [facility] [address]                                    [place], [place] [phone] [place] [address], [place] (617)[phone]    Dr. Adele Schilder  938-592-7274   [facility] Dept. 1) [address], [place] 2) [place] 3)  [place] [address], [place] [phone]  (289)[phone]   [place] (902)536-3487 or (909)102-2992 (After Hours)      Begin to take over the counter stool softener (such as colace or miralax), as directed on packaging, for the next several weeks.  Continue to take your usual prescriptions as previously directed.  Call your regular medical doctor on Monday to schedule a follow up appointment this week.  Return to the Emergency Department immediately if not improving (or even worsening), any black or bloody stool or vomit, if you develop a fever over "101," or for any other concerns.

## 2014-09-09 NOTE — ED Notes (Signed)
Patient c/o constipation since Thursday. Per patient had 3 BMs on Thursday and then had a endoscopy at [facility]. Per patient has tried to dismpact herself with a glove, petroleum jelly,  and her fingers. Patient also reports using a glycerin suppository with no relief. Denies any vomiting or rectal bleeding.

## 2014-09-09 NOTE — ED Provider Notes (Signed)
CSN: [id number]     Arrival date & time 09/09/14  1131 History   First MD Initiated Contact with Patient 09/09/14 1231     Chief Complaint  Patient presents with  . Constipation     HPI Pt was seen at 1240. Per pt, c/o gradual onset and persistence of constant fecal impaction and constipation for the past 2 days. Pt states she tried to disimpact herself this morning without success. Pt states she used a glycerin suppository at 1000/1030 this morning PTA without results. Denies rectal bleeding, no black or blood in stools, no N/V/D, no abd pain, no back pain, no CP/SOB, no fevers.    Past Medical History  Diagnosis Date  . Asthma   . Nephrolithiasis   . Gastroesophageal reflux   . Diverticulitis 2012    Treated medically  . Chest pain   . Anxiety and depression   . Hyperlipidemia   . Diabetes mellitus type II, controlled     diet controlled,exercise, no meds  . Depression   . Anxiety   . Breast carcinoma 08/2009    Invasive ductal; left; lumpectomy and sentinel node excision- oncology visits yearly.   Past Surgical History  Procedure Laterality Date  . Cesarean section    . Tubal ligation    . Nephrolithotomy    . Dilation and curettage of uterus    . Colonoscopy  2004  . Breast lumpectomy      Left  . Eus N/A 09/07/2014    Procedure: UPPER ENDOSCOPIC ULTRASOUND (EUS) LINEAR;  Surgeon: Milus Banister, MD;  Location: [facility];  Service: Endoscopy;  Laterality: N/A;   Family History  Problem Relation Age of Onset  . Coronary artery disease Mother   . Stroke Father   . Diabetes Mellitus II Father   . Colon cancer Neg Hx    History  Substance Use Topics  . Smoking status: Never Smoker   . Smokeless tobacco: Never Used  . Alcohol Use: No   OB History    Gravida Para Term Preterm AB TAB SAB Ectopic Multiple Living   2 2 2       2      Review of Systems ROS: Statement: All systems negative except as marked or noted in the HPI; Constitutional: Negative for fever  and chills. ; ; Eyes: Negative for eye pain, redness and discharge. ; ; ENMT: Negative for ear pain, hoarseness, nasal congestion, sinus pressure and sore throat. ; ; Cardiovascular: Negative for chest pain, palpitations, diaphoresis, dyspnea and peripheral edema. ; ; Respiratory: Negative for cough, wheezing and stridor. ; ; Gastrointestinal: +constipation, fecal impaction. Negative for nausea, vomiting, diarrhea, abdominal pain, blood in stool, hematemesis, jaundice and rectal bleeding. . ; ; Genitourinary: Negative for dysuria, flank pain and hematuria. ; ; Musculoskeletal: Negative for back pain and neck pain. Negative for swelling and trauma.; ; Skin: Negative for pruritus, rash, abrasions, blisters, bruising and skin lesion.; ; Neuro: Negative for headache, lightheadedness and neck stiffness. Negative for weakness, altered level of consciousness , altered mental status, extremity weakness, paresthesias, involuntary movement, seizure and syncope.      Allergies  Biaxin; Codeine; Metronidazole; Cephalosporins; and Keflex  Home Medications   Prior to Admission medications   Medication Sig Start Date End Date Taking? Authorizing Provider  albuterol (PROVENTIL HFA;VENTOLIN HFA) 108 (90 BASE) MCG/ACT inhaler Inhale 2 puffs into the lungs every 6 (six) hours as needed for wheezing.    Historical Provider, MD  ALPRAZolam Duanne Moron) 0.25 MG tablet Take  0.25 mg by mouth at bedtime as needed for anxiety.    Historical Provider, MD  anastrozole (ARIMIDEX) 1 MG tablet Take 1 mg by mouth daily.    Historical Provider, MD  calcium-vitamin D (OSCAL WITH D) 500-200 MG-UNIT per tablet Take 1 tablet by mouth 2 (two) times daily.    Historical Provider, MD  Cholecalciferol (VITAMIN D3) 2000 UNITS capsule Take 2,000 Units by mouth every morning.     Historical Provider, MD  ciclesonide (ALVESCO) 160 MCG/ACT inhaler Inhale 1 puff into the lungs 2 (two) times daily.    Historical Provider, MD  ciprofloxacin (CIPRO) 500  MG tablet Take 1 tablet (500 mg total) by mouth 2 (two) times daily. 09/07/14   Milus Banister, MD  fexofenadine (ALLEGRA) 180 MG tablet Take 180 mg by mouth daily as needed for allergies or rhinitis.    Historical Provider, MD  omeprazole (PRILOSEC) 20 MG capsule Take 1 capsule (20 mg total) by mouth daily. 05/04/14   Davonna Belling, MD  valACYclovir (VALTREX) 500 MG tablet Take 500 mg by mouth daily as needed (outbreak).     Historical Provider, MD   BP 147/85 mmHg  Pulse 76  Temp(Src) 98.1 F (36.7 C) (Oral)  Resp 20  Ht 5\' 1"  (1.549 m)  Wt 164 lb (74.39 kg)  BMI 31.00 kg/m2  SpO2 99% Physical Exam  1245: Physical examination:  Nursing notes reviewed; Vital signs and O2 SAT reviewed;  Constitutional: Well developed, Well nourished, Well hydrated, In no acute distress; Head:  Normocephalic, atraumatic; Eyes: EOMI, PERRL, No scleral icterus; ENMT: Mouth and pharynx normal, Mucous membranes moist; Neck: Supple, Full range of motion, No lymphadenopathy; Cardiovascular: Regular rate and rhythm, No gallop; Respiratory: Breath sounds clear & equal bilaterally, No wheezes.  Speaking full sentences with ease, Normal respiratory effort/excursion; Chest: Nontender, Movement normal; Abdomen: Soft, Nontender, Nondistended, Normal bowel sounds; Genitourinary: No CVA tenderness; Extremities: Pulses normal, No tenderness, No edema, No calf edema or asymmetry.; Neuro: AA&Ox3, Major CN grossly intact.  Speech clear. No gross focal motor or sensory deficits in extremities. Climbs on and off chair in exam room easily by herself. Gait steady.; Skin: Color normal, Warm, Dry.   ED Course  Procedures     EKG Interpretation None      MDM  MDM Reviewed: previous chart, nursing note and vitals      1250:  Pt has had several large BM's while in the ED. States she feels "much better" and wants to go home now. Abd benign, VSS. Pt is ambulatory with steady gait. Return precautions given; pt verb  understanding. Dx d/w pt and family.  Questions answered.  Verb understanding, agreeable to d/c home with outpt f/u.   Francine Graven, DO 09/13/14 1126

## 2014-09-14 ENCOUNTER — Telehealth: Payer: Self-pay | Admitting: Gastroenterology

## 2014-09-14 NOTE — Telephone Encounter (Signed)
Caller name: Holley Dexter Call back number:[phone]   Reason for call:  Pt is returning your call regarding her pathology.  She would like a call back as soon as you are available.

## 2014-09-26 ENCOUNTER — Ambulatory Visit (HOSPITAL_COMMUNITY)
Admission: RE | Admit: 2014-09-26 | Discharge: 2014-09-26 | Disposition: A | Discharge status: Home / Self Care | Payer: Medicare Other | Source: Ambulatory Visit | Attending: Pulmonary Disease | Admitting: Pulmonary Disease

## 2014-09-26 DIAGNOSIS — Z853 Personal history of malignant neoplasm of breast: Secondary | ICD-10-CM | POA: Diagnosis not present

## 2014-09-26 DIAGNOSIS — R928 Other abnormal and inconclusive findings on diagnostic imaging of breast: Secondary | ICD-10-CM | POA: Diagnosis not present

## 2014-09-26 DIAGNOSIS — Z9889 Other specified postprocedural states: Secondary | ICD-10-CM | POA: Diagnosis not present

## 2014-09-26 DIAGNOSIS — Z9012 Acquired absence of left breast and nipple: Secondary | ICD-10-CM | POA: Diagnosis not present

## 2014-10-03 ENCOUNTER — Encounter (HOSPITAL_COMMUNITY): Payer: Medicare Other

## 2014-10-03 DIAGNOSIS — J45909 Unspecified asthma, uncomplicated: Secondary | ICD-10-CM | POA: Diagnosis not present

## 2014-10-03 DIAGNOSIS — Z87442 Personal history of urinary calculi: Secondary | ICD-10-CM | POA: Diagnosis not present

## 2014-10-03 DIAGNOSIS — K862 Cyst of pancreas: Secondary | ICD-10-CM | POA: Diagnosis not present

## 2014-10-03 DIAGNOSIS — Z9851 Tubal ligation status: Secondary | ICD-10-CM | POA: Diagnosis not present

## 2014-10-03 DIAGNOSIS — F419 Anxiety disorder, unspecified: Secondary | ICD-10-CM | POA: Diagnosis not present

## 2014-10-03 DIAGNOSIS — Z853 Personal history of malignant neoplasm of breast: Secondary | ICD-10-CM | POA: Diagnosis not present

## 2014-10-04 ENCOUNTER — Telehealth: Payer: Self-pay | Admitting: Gastroenterology

## 2014-10-04 NOTE — Telephone Encounter (Signed)
Received records from [facility] forwarded to Dr. Ardis Hughs 10/04/14 fbg.

## 2014-10-10 DIAGNOSIS — K862 Cyst of pancreas: Secondary | ICD-10-CM | POA: Diagnosis not present

## 2014-10-31 DIAGNOSIS — K862 Cyst of pancreas: Secondary | ICD-10-CM | POA: Diagnosis not present

## 2014-11-08 DIAGNOSIS — R300 Dysuria: Secondary | ICD-10-CM | POA: Diagnosis not present

## 2014-11-08 DIAGNOSIS — K862 Cyst of pancreas: Secondary | ICD-10-CM | POA: Diagnosis not present

## 2014-11-08 DIAGNOSIS — E1165 Type 2 diabetes mellitus with hyperglycemia: Secondary | ICD-10-CM | POA: Diagnosis not present

## 2014-11-08 DIAGNOSIS — E569 Vitamin deficiency, unspecified: Secondary | ICD-10-CM | POA: Diagnosis not present

## 2014-11-26 ENCOUNTER — Emergency Department (HOSPITAL_COMMUNITY)
Admission: EM | Admit: 2014-11-26 | Discharge: 2014-11-26 | Disposition: A | Discharge status: Home / Self Care | Payer: Medicare Other | Attending: Emergency Medicine | Admitting: Emergency Medicine

## 2014-11-26 ENCOUNTER — Encounter (HOSPITAL_COMMUNITY): Payer: Self-pay | Admitting: Emergency Medicine

## 2014-11-26 DIAGNOSIS — F329 Major depressive disorder, single episode, unspecified: Secondary | ICD-10-CM | POA: Diagnosis not present

## 2014-11-26 DIAGNOSIS — K219 Gastro-esophageal reflux disease without esophagitis: Secondary | ICD-10-CM | POA: Diagnosis not present

## 2014-11-26 DIAGNOSIS — J45909 Unspecified asthma, uncomplicated: Secondary | ICD-10-CM | POA: Insufficient documentation

## 2014-11-26 DIAGNOSIS — Z87442 Personal history of urinary calculi: Secondary | ICD-10-CM | POA: Insufficient documentation

## 2014-11-26 DIAGNOSIS — H578 Other specified disorders of eye and adnexa: Secondary | ICD-10-CM | POA: Diagnosis present

## 2014-11-26 DIAGNOSIS — Z7951 Long term (current) use of inhaled steroids: Secondary | ICD-10-CM | POA: Diagnosis not present

## 2014-11-26 DIAGNOSIS — E119 Type 2 diabetes mellitus without complications: Secondary | ICD-10-CM | POA: Insufficient documentation

## 2014-11-26 DIAGNOSIS — Z792 Long term (current) use of antibiotics: Secondary | ICD-10-CM | POA: Insufficient documentation

## 2014-11-26 DIAGNOSIS — F419 Anxiety disorder, unspecified: Secondary | ICD-10-CM | POA: Insufficient documentation

## 2014-11-26 DIAGNOSIS — Z853 Personal history of malignant neoplasm of breast: Secondary | ICD-10-CM | POA: Diagnosis not present

## 2014-11-26 DIAGNOSIS — Z79899 Other long term (current) drug therapy: Secondary | ICD-10-CM | POA: Diagnosis not present

## 2014-11-26 DIAGNOSIS — H1132 Conjunctival hemorrhage, left eye: Secondary | ICD-10-CM | POA: Diagnosis not present

## 2014-11-26 NOTE — ED Notes (Signed)
MD Lockwood at bedside.  

## 2014-11-26 NOTE — ED Provider Notes (Signed)
CSN: [id number]     Arrival date & time 11/26/14  0808 History  This chart was scribed for Carmin Muskrat, MD by Starleen Arms, ED Scribe. This patient was seen in room APA12/APA12 and the patient's care was started at 8:27 AM.   Chief Complaint  Patient presents with  . Eye Problem   The history is provided by the patient. No language interpreter was used.  HPI Comments: Betty Nguyen is a 74 y.o. female who presents to the Emergency Department complaining of gradually worsening eye redness onset yesterday that began in left lateral corner first and later spread throughout rest of the sclera.  Prior to onset, the patient reports she was mowing her lawn when she gently rubbed her eye.  The complaint is non-painful except with some slight discomfort only present when her eyes track left.  She denies headache, vision changes, fever chills.    Patient also notes a history of DM for which her medication was recently changed.  She notes some slight lightheadedness yesterday that is currently resolved.    Past Medical History  Diagnosis Date  . Asthma   . Nephrolithiasis   . Gastroesophageal reflux   . Diverticulitis 2012    Treated medically  . Chest pain   . Anxiety and depression   . Hyperlipidemia   . Diabetes mellitus type II, controlled     diet controlled,exercise, no meds  . Depression   . Anxiety   . Breast carcinoma 08/2009    Invasive ductal; left; lumpectomy and sentinel node excision- oncology visits yearly.   Past Surgical History  Procedure Laterality Date  . Cesarean section    . Tubal ligation    . Nephrolithotomy    . Dilation and curettage of uterus    . Colonoscopy  2004  . Breast lumpectomy      Left  . Eus N/A 09/07/2014    Procedure: UPPER ENDOSCOPIC ULTRASOUND (EUS) LINEAR;  Surgeon: Milus Banister, MD;  Location: [facility];  Service: Endoscopy;  Laterality: N/A;   Family History  Problem Relation Age of Onset  . Coronary artery disease Mother   . Stroke  Father   . Diabetes Mellitus II Father   . Colon cancer Neg Hx    History  Substance Use Topics  . Smoking status: Never Smoker   . Smokeless tobacco: Never Used  . Alcohol Use: No   OB History    Gravida Para Term Preterm AB TAB SAB Ectopic Multiple Living   2 2 2       2      Review of Systems  Constitutional: Negative for fever and chills.       Per HPI, otherwise negative  HENT:       Per HPI, otherwise negative  Eyes: Positive for redness. Negative for pain.  Respiratory:       Per HPI, otherwise negative  Cardiovascular:       Per HPI, otherwise negative  Gastrointestinal: Negative for nausea.  Endocrine:       Negative aside from HPI  Genitourinary:       Neg aside from HPI   Musculoskeletal:       Per HPI, otherwise negative  Skin: Negative.   Neurological: Positive for light-headedness. Negative for syncope and headaches.      Allergies  Biaxin; Codeine; Metronidazole; Cephalosporins; and Keflex  Home Medications   Prior to Admission medications   Medication Sig Start Date End Date Taking? Authorizing Provider  albuterol (PROVENTIL  HFA;VENTOLIN HFA) 108 (90 BASE) MCG/ACT inhaler Inhale 2 puffs into the lungs every 6 (six) hours as needed for wheezing.    Historical Provider, MD  ALPRAZolam Duanne Moron) 0.25 MG tablet Take 0.25 mg by mouth at bedtime as needed for anxiety.    Historical Provider, MD  anastrozole (ARIMIDEX) 1 MG tablet Take 1 mg by mouth daily.    Historical Provider, MD  calcium-vitamin D (OSCAL WITH D) 500-200 MG-UNIT per tablet Take 1 tablet by mouth 2 (two) times daily.    Historical Provider, MD  Cholecalciferol (VITAMIN D3) 2000 UNITS capsule Take 2,000 Units by mouth every morning.     Historical Provider, MD  ciclesonide (ALVESCO) 160 MCG/ACT inhaler Inhale 1 puff into the lungs 2 (two) times daily.    Historical Provider, MD  ciprofloxacin (CIPRO) 500 MG tablet Take 1 tablet (500 mg total) by mouth 2 (two) times daily. 09/07/14   Milus Banister, MD  fexofenadine (ALLEGRA) 180 MG tablet Take 180 mg by mouth daily as needed for allergies or rhinitis.    Historical Provider, MD  omeprazole (PRILOSEC) 20 MG capsule Take 1 capsule (20 mg total) by mouth daily. 05/04/14   Davonna Belling, MD  valACYclovir (VALTREX) 500 MG tablet Take 500 mg by mouth daily as needed (outbreak).     Historical Provider, MD   There were no vitals taken for this visit. Physical Exam  Constitutional: She is oriented to person, place, and time. She appears well-developed and well-nourished. No distress.  HENT:  Head: Normocephalic and atraumatic.  Eyes: EOM are normal. Pupils are equal, round, and reactive to light.  Subconjunctival hemorrhage - L  Cardiovascular: Normal rate and regular rhythm.   Pulmonary/Chest: Effort normal and breath sounds normal. No stridor. No respiratory distress.  Musculoskeletal: She exhibits no edema.  Neurological: She is alert and oriented to person, place, and time. No cranial nerve deficit.  Skin: Skin is warm and dry.  Psychiatric: She has a normal mood and affect.  Nursing note and vitals reviewed.   ED Course  Procedures (including critical care time)  COORDINATION OF CARE:  8:33 AM Discussed treatment plan with patient at bedside.  Patient acknowledges and agrees with plan.      MDM   Final diagnoses:  Conjunctival hemorrhage of left eye   I personally performed the services described in this documentation, which was scribed in my presence. The recorded information has been reviewed and is accurate.  Patient presents with left eye discoloration consistent with subconjunctival hemorrhage. No vision loss, no pupillary defect, no headache, no systemic complaints. After a conversation on return precautions, follow-up instructions, the patient was discharged in stable condition.  Carmin Muskrat, MD 11/26/14 [phone]

## 2014-11-26 NOTE — Discharge Instructions (Signed)
As discussed, the redness in her eye is likely due to a ruptured superficial blood vessel. Typically these lesions improve over 3-5 days.  If you develop new, or concerning changes in your condition, specifically headache or visual loss, it is very important to return here or see your ophthalmologist immediately.   Subconjunctival Hemorrhage A subconjunctival hemorrhage is a bright red patch covering a portion of the white of the eye. The white part of the eye is called the sclera, and it is covered by a thin membrane called the conjunctiva. This membrane is clear, except for tiny blood vessels that you can see with the naked eye. When your eye is irritated or inflamed and becomes red, it is because the vessels in the conjunctiva are swollen. Sometimes, a blood vessel in the conjunctiva can break and bleed. When this occurs, the blood builds up between the conjunctiva and the sclera, and spreads out to create a red area. The red spot may be very small at first. It may then spread to cover a larger part of the surface of the eye, or even all of the visible white part of the eye. In almost all cases, the blood will go away and the eye will become white again. Before completely dissolving, however, the red area may spread. It may also become brownish-yellow in color before going away. If a lot of blood collects under the conjunctiva, it may look like a bulge on the surface of the eye. This looks scary, but it will also eventually flatten out and go away. Subconjunctival hemorrhages do not cause pain, but if swollen, may cause a feeling of irritation. There is no effect on vision.  CAUSES   The most common cause is mild trauma (rubbing the eye, irritation).  Subconjunctival hemorrhages can happen because of coughing or straining (lifting heavy objects), vomiting, or sneezing.  In some cases, your doctor may want to check your blood pressure. High blood pressure can also cause a subconjunctival  hemorrhage.  Severe trauma or blunt injuries.  Diseases that affect blood clotting (hemophilia, leukemia).  Abnormalities of blood vessels behind the eye (carotid cavernous sinus fistula).  Tumors behind the eye.  Certain drugs (aspirin, Coumadin, heparin).  Recent eye surgery. HOME CARE INSTRUCTIONS   Do not worry about the appearance of your eye. You may continue your usual activities.  Often, follow-up is not necessary. SEEK MEDICAL CARE IF:   Your eye becomes painful.  The bleeding does not disappear within 3 weeks.  Bleeding occurs elsewhere, for example, under the skin, in the mouth, or in the other eye.  You have recurring subconjunctival hemorrhages. SEEK IMMEDIATE MEDICAL CARE IF:   Your vision changes or you have difficulty seeing.  You develop a severe headache, persistent vomiting, confusion, or abnormal drowsiness (lethargy).  Your eye seems to bulge or protrude from the eye socket.  You notice the sudden appearance of bruises or have spontaneous bleeding elsewhere on your body. Document Released: 04/14/2005 Document Revised: 08/29/2013 Document Reviewed: 03/12/2009 [employer] Patient Information 2015 [place], [place]. This information is not intended to replace advice given to you by your health care provider. Make sure you discuss any questions you have with your health care provider.

## 2014-11-26 NOTE — ED Notes (Signed)
Pt reports that she noted a busted blood vessel in her left eye yesterday, pt states later in the day she noticed that the entire sclera has become bloody. Pt denies any changes in vision, states it is not painful just uncomfortable.

## 2014-11-27 DIAGNOSIS — H1132 Conjunctival hemorrhage, left eye: Secondary | ICD-10-CM | POA: Diagnosis not present

## 2014-12-25 DIAGNOSIS — J45909 Unspecified asthma, uncomplicated: Secondary | ICD-10-CM | POA: Diagnosis not present

## 2014-12-25 DIAGNOSIS — Z01818 Encounter for other preprocedural examination: Secondary | ICD-10-CM | POA: Diagnosis not present

## 2014-12-25 DIAGNOSIS — K862 Cyst of pancreas: Secondary | ICD-10-CM | POA: Diagnosis not present

## 2014-12-25 DIAGNOSIS — E119 Type 2 diabetes mellitus without complications: Secondary | ICD-10-CM | POA: Diagnosis not present

## 2014-12-25 DIAGNOSIS — R109 Unspecified abdominal pain: Secondary | ICD-10-CM | POA: Diagnosis not present

## 2014-12-25 DIAGNOSIS — Z01810 Encounter for preprocedural cardiovascular examination: Secondary | ICD-10-CM | POA: Diagnosis not present

## 2014-12-27 DIAGNOSIS — R9439 Abnormal result of other cardiovascular function study: Secondary | ICD-10-CM | POA: Diagnosis not present

## 2014-12-27 DIAGNOSIS — I491 Atrial premature depolarization: Secondary | ICD-10-CM | POA: Diagnosis not present

## 2014-12-27 DIAGNOSIS — I471 Supraventricular tachycardia: Secondary | ICD-10-CM | POA: Diagnosis not present

## 2014-12-27 DIAGNOSIS — I493 Ventricular premature depolarization: Secondary | ICD-10-CM | POA: Diagnosis not present

## 2014-12-27 DIAGNOSIS — R0789 Other chest pain: Secondary | ICD-10-CM | POA: Diagnosis not present

## 2015-01-05 DIAGNOSIS — G8918 Other acute postprocedural pain: Secondary | ICD-10-CM | POA: Diagnosis not present

## 2015-01-05 DIAGNOSIS — D490 Neoplasm of unspecified behavior of digestive system: Secondary | ICD-10-CM | POA: Diagnosis not present

## 2015-01-05 DIAGNOSIS — K769 Liver disease, unspecified: Secondary | ICD-10-CM | POA: Diagnosis not present

## 2015-01-05 DIAGNOSIS — K862 Cyst of pancreas: Secondary | ICD-10-CM | POA: Diagnosis not present

## 2015-01-05 DIAGNOSIS — Z881 Allergy status to other antibiotic agents status: Secondary | ICD-10-CM | POA: Diagnosis not present

## 2015-01-05 DIAGNOSIS — R911 Solitary pulmonary nodule: Secondary | ICD-10-CM | POA: Diagnosis not present

## 2015-01-05 DIAGNOSIS — E1165 Type 2 diabetes mellitus with hyperglycemia: Secondary | ICD-10-CM | POA: Diagnosis present

## 2015-01-05 DIAGNOSIS — J45909 Unspecified asthma, uncomplicated: Secondary | ICD-10-CM | POA: Diagnosis present

## 2015-01-05 DIAGNOSIS — D376 Neoplasm of uncertain behavior of liver, gallbladder and bile ducts: Secondary | ICD-10-CM | POA: Diagnosis not present

## 2015-01-05 DIAGNOSIS — K869 Disease of pancreas, unspecified: Secondary | ICD-10-CM | POA: Diagnosis not present

## 2015-01-05 DIAGNOSIS — Z79899 Other long term (current) drug therapy: Secondary | ICD-10-CM | POA: Diagnosis not present

## 2015-01-05 DIAGNOSIS — Z853 Personal history of malignant neoplasm of breast: Secondary | ICD-10-CM | POA: Diagnosis not present

## 2015-01-05 DIAGNOSIS — D136 Benign neoplasm of pancreas: Secondary | ICD-10-CM | POA: Diagnosis not present

## 2015-01-05 DIAGNOSIS — K219 Gastro-esophageal reflux disease without esophagitis: Secondary | ICD-10-CM | POA: Diagnosis present

## 2015-01-05 DIAGNOSIS — R109 Unspecified abdominal pain: Secondary | ICD-10-CM | POA: Diagnosis not present

## 2015-01-05 DIAGNOSIS — Z888 Allergy status to other drugs, medicaments and biological substances status: Secondary | ICD-10-CM | POA: Diagnosis not present

## 2015-01-05 DIAGNOSIS — Z885 Allergy status to narcotic agent status: Secondary | ICD-10-CM | POA: Diagnosis not present

## 2015-01-05 DIAGNOSIS — F419 Anxiety disorder, unspecified: Secondary | ICD-10-CM | POA: Diagnosis present

## 2015-01-05 DIAGNOSIS — K811 Chronic cholecystitis: Secondary | ICD-10-CM | POA: Diagnosis not present

## 2015-01-12 DIAGNOSIS — G43A0 Cyclical vomiting, not intractable: Secondary | ICD-10-CM | POA: Diagnosis not present

## 2015-01-12 DIAGNOSIS — Z87442 Personal history of urinary calculi: Secondary | ICD-10-CM | POA: Diagnosis not present

## 2015-01-12 DIAGNOSIS — R109 Unspecified abdominal pain: Secondary | ICD-10-CM | POA: Diagnosis not present

## 2015-01-12 DIAGNOSIS — Z853 Personal history of malignant neoplasm of breast: Secondary | ICD-10-CM | POA: Diagnosis not present

## 2015-01-12 DIAGNOSIS — K5900 Constipation, unspecified: Secondary | ICD-10-CM | POA: Diagnosis not present

## 2015-01-12 DIAGNOSIS — J45909 Unspecified asthma, uncomplicated: Secondary | ICD-10-CM | POA: Diagnosis not present

## 2015-01-12 DIAGNOSIS — E860 Dehydration: Secondary | ICD-10-CM | POA: Diagnosis not present

## 2015-01-12 DIAGNOSIS — K862 Cyst of pancreas: Secondary | ICD-10-CM | POA: Diagnosis not present

## 2015-01-12 DIAGNOSIS — B009 Herpesviral infection, unspecified: Secondary | ICD-10-CM | POA: Diagnosis not present

## 2015-01-13 DIAGNOSIS — J45909 Unspecified asthma, uncomplicated: Secondary | ICD-10-CM | POA: Diagnosis not present

## 2015-01-13 DIAGNOSIS — E860 Dehydration: Secondary | ICD-10-CM | POA: Diagnosis not present

## 2015-01-13 DIAGNOSIS — B009 Herpesviral infection, unspecified: Secondary | ICD-10-CM | POA: Diagnosis not present

## 2015-01-13 DIAGNOSIS — K5900 Constipation, unspecified: Secondary | ICD-10-CM | POA: Diagnosis not present

## 2015-01-13 DIAGNOSIS — R109 Unspecified abdominal pain: Secondary | ICD-10-CM | POA: Diagnosis not present

## 2015-01-13 DIAGNOSIS — K862 Cyst of pancreas: Secondary | ICD-10-CM | POA: Diagnosis not present

## 2015-01-30 DIAGNOSIS — K862 Cyst of pancreas: Secondary | ICD-10-CM | POA: Diagnosis not present

## 2015-02-01 ENCOUNTER — Telehealth: Payer: Self-pay | Admitting: Gastroenterology

## 2015-02-01 NOTE — Telephone Encounter (Signed)
Rec'd from [facility] forward 4 pages to Dr. Ardis Hughs

## 2015-02-04 ENCOUNTER — Encounter (HOSPITAL_COMMUNITY): Payer: Self-pay | Admitting: Emergency Medicine

## 2015-02-04 ENCOUNTER — Emergency Department (HOSPITAL_COMMUNITY)
Admission: EM | Admit: 2015-02-04 | Discharge: 2015-02-04 | Disposition: A | Discharge status: Home / Self Care | Payer: Medicare Other | Attending: Emergency Medicine | Admitting: Emergency Medicine

## 2015-02-04 DIAGNOSIS — R110 Nausea: Secondary | ICD-10-CM | POA: Insufficient documentation

## 2015-02-04 DIAGNOSIS — R509 Fever, unspecified: Secondary | ICD-10-CM | POA: Insufficient documentation

## 2015-02-04 DIAGNOSIS — K219 Gastro-esophageal reflux disease without esophagitis: Secondary | ICD-10-CM | POA: Diagnosis not present

## 2015-02-04 DIAGNOSIS — Z853 Personal history of malignant neoplasm of breast: Secondary | ICD-10-CM | POA: Diagnosis not present

## 2015-02-04 DIAGNOSIS — Z9889 Other specified postprocedural states: Secondary | ICD-10-CM | POA: Diagnosis not present

## 2015-02-04 DIAGNOSIS — J45909 Unspecified asthma, uncomplicated: Secondary | ICD-10-CM | POA: Insufficient documentation

## 2015-02-04 DIAGNOSIS — Z87442 Personal history of urinary calculi: Secondary | ICD-10-CM | POA: Insufficient documentation

## 2015-02-04 DIAGNOSIS — Z79899 Other long term (current) drug therapy: Secondary | ICD-10-CM | POA: Insufficient documentation

## 2015-02-04 DIAGNOSIS — R000 Tachycardia, unspecified: Secondary | ICD-10-CM | POA: Diagnosis not present

## 2015-02-04 DIAGNOSIS — R5383 Other fatigue: Secondary | ICD-10-CM | POA: Diagnosis not present

## 2015-02-04 DIAGNOSIS — L02211 Cutaneous abscess of abdominal wall: Secondary | ICD-10-CM | POA: Diagnosis not present

## 2015-02-04 DIAGNOSIS — T814XXA Infection following a procedure, initial encounter: Secondary | ICD-10-CM | POA: Diagnosis not present

## 2015-02-04 DIAGNOSIS — F419 Anxiety disorder, unspecified: Secondary | ICD-10-CM | POA: Diagnosis not present

## 2015-02-04 DIAGNOSIS — L03311 Cellulitis of abdominal wall: Secondary | ICD-10-CM | POA: Diagnosis present

## 2015-02-04 DIAGNOSIS — K9189 Other postprocedural complications and disorders of digestive system: Secondary | ICD-10-CM | POA: Diagnosis not present

## 2015-02-04 DIAGNOSIS — E119 Type 2 diabetes mellitus without complications: Secondary | ICD-10-CM | POA: Insufficient documentation

## 2015-02-04 DIAGNOSIS — K651 Peritoneal abscess: Secondary | ICD-10-CM | POA: Diagnosis not present

## 2015-02-04 DIAGNOSIS — R21 Rash and other nonspecific skin eruption: Secondary | ICD-10-CM | POA: Diagnosis present

## 2015-02-04 DIAGNOSIS — B954 Other streptococcus as the cause of diseases classified elsewhere: Secondary | ICD-10-CM | POA: Diagnosis not present

## 2015-02-04 LAB — CBC WITH DIFFERENTIAL/PLATELET
BASOS ABS: 0 10*3/uL (ref 0.0–0.1)
Basophils Relative: 0 %
EOS PCT: 0 %
Eosinophils Absolute: 0 10*3/uL (ref 0.0–0.7)
HEMATOCRIT: 31.2 % — AB (ref 36.0–46.0)
Hemoglobin: 10.4 g/dL — ABNORMAL LOW (ref 12.0–15.0)
LYMPHS ABS: 1.5 10*3/uL (ref 0.7–4.0)
Lymphocytes Relative: 8 %
MCH: 29.1 pg (ref 26.0–34.0)
MCHC: 33.3 g/dL (ref 30.0–36.0)
MCV: 87.2 fL (ref 78.0–100.0)
MONO ABS: 1.2 10*3/uL — AB (ref 0.1–1.0)
Monocytes Relative: 6 %
Neutro Abs: 17.1 10*3/uL — ABNORMAL HIGH (ref 1.7–7.7)
Neutrophils Relative %: 86 %
Platelets: 485 10*3/uL — ABNORMAL HIGH (ref 150–400)
RBC: 3.58 MIL/uL — ABNORMAL LOW (ref 3.87–5.11)
RDW: 13.6 % (ref 11.5–15.5)
WBC Morphology: INCREASED
WBC: 19.8 10*3/uL — ABNORMAL HIGH (ref 4.0–10.5)

## 2015-02-04 LAB — URINALYSIS, ROUTINE W REFLEX MICROSCOPIC
Glucose, UA: 500 mg/dL — AB
Leukocytes, UA: NEGATIVE
Nitrite: NEGATIVE
Protein, ur: 30 mg/dL — AB
Specific Gravity, Urine: >1.03 — ABNORMAL HIGH (ref 1.005–1.030)
Urobilinogen, UA: 0.2 mg/dL (ref 0.0–1.0)
pH: 6 (ref 5.0–8.0)

## 2015-02-04 LAB — LACTIC ACID, PLASMA: Lactic Acid, Venous: 1.4 mmol/L (ref 0.5–2.0)

## 2015-02-04 LAB — URINE MICROSCOPIC-ADD ON

## 2015-02-04 LAB — COMPREHENSIVE METABOLIC PANEL
ALT: 17 U/L (ref 14–54)
AST: 13 U/L — AB (ref 15–41)
Albumin: 2.6 g/dL — ABNORMAL LOW (ref 3.5–5.0)
Alkaline Phosphatase: 79 U/L (ref 38–126)
Anion gap: 16 — ABNORMAL HIGH (ref 5–15)
BILIRUBIN TOTAL: 1.3 mg/dL — AB (ref 0.3–1.2)
BUN: 7 mg/dL (ref 6–20)
CO2: 16 mmol/L — ABNORMAL LOW (ref 22–32)
Calcium: 9 mg/dL (ref 8.9–10.3)
Chloride: 101 mmol/L (ref 101–111)
Creatinine, Ser: 0.6 mg/dL (ref 0.44–1.00)
GFR calc Af Amer: >60 mL/min (ref >60)
GFR calc non Af Amer: >60 mL/min (ref >60)
Glucose, Bld: 172 mg/dL — ABNORMAL HIGH (ref 65–99)
Potassium: 3.1 mmol/L — ABNORMAL LOW (ref 3.5–5.1)
Sodium: 133 mmol/L — ABNORMAL LOW (ref 135–145)
TOTAL PROTEIN: 7.3 g/dL (ref 6.5–8.1)

## 2015-02-04 MED ORDER — SODIUM CHLORIDE 0.9 % IV SOLN
Freq: Once | INTRAVENOUS | Status: AC
  Administered 2015-02-04: 16:00:00 via INTRAVENOUS

## 2015-02-04 MED ORDER — VANCOMYCIN HCL 10 G IV SOLR
1500.0000 mg | Freq: Once | INTRAVENOUS | Status: AC
  Administered 2015-02-04: 1500 mg via INTRAVENOUS
  Filled 2015-02-04: qty 1500

## 2015-02-04 MED ORDER — PIPERACILLIN-TAZOBACTAM 3.375 G IVPB 30 MIN
3.3750 g | Freq: Once | INTRAVENOUS | Status: AC
  Administered 2015-02-04: 3.375 g via INTRAVENOUS
  Filled 2015-02-04: qty 50

## 2015-02-04 MED ORDER — SODIUM CHLORIDE 0.9 % IV BOLUS (SEPSIS)
1000.0000 mL | Freq: Once | INTRAVENOUS | Status: AC
  Administered 2015-02-04: 1000 mL via INTRAVENOUS

## 2015-02-04 NOTE — ED Notes (Signed)
Pt states that she had surgery at [facility] on Sept 9th for a Whipple procedure.  Skin is red and painful.  Incision is healed.

## 2015-02-04 NOTE — ED Notes (Signed)
Report given to [facility]. [facility] reported would be here approx 25 minutes to pick up pt.

## 2015-02-04 NOTE — ED Notes (Signed)
Pt given mouth swab for dry mouth. nad noted.

## 2015-02-04 NOTE — ED Provider Notes (Signed)
CSN: [id number]     Arrival date & time 02/04/15  1142 History   First MD Initiated Contact with Patient 02/04/15 1300     Chief Complaint  Patient presents with  . Cellulitis     (Consider location/radiation/quality/duration/timing/severity/associated sxs/prior Treatment) HPI Comments: 74 y.o. Female with history of GERD, diverticulitis, recent Whipple procedure about 1 month ago presents for redness and warmth of the skin of her stomach.  The patient reports that this started over the last two days or so and has been progressively worsening.  She has not been able to eat much since her procedure but especially not over the last few days.  She says that it does not feel like it hurts inside her stomach but that the skin is hot and tense to the touch on the abdominal wall.  She has felt like she may have had a fever at home.  No vomiting or diarrhea.  No drainage from the area.   Past Medical History  Diagnosis Date  . Asthma   . Nephrolithiasis   . Gastroesophageal reflux   . Diverticulitis 2012    Treated medically  . Chest pain   . Anxiety and depression   . Hyperlipidemia   . Diabetes mellitus type II, controlled ([place])     diet controlled,exercise, no meds  . Depression   . Anxiety   . Breast carcinoma ([place]) 08/2009    Invasive ductal; left; lumpectomy and sentinel node excision- oncology visits yearly.   Past Surgical History  Procedure Laterality Date  . Cesarean section    . Tubal ligation    . Nephrolithotomy    . Dilation and curettage of uterus    . Colonoscopy  2004  . Breast lumpectomy      Left  . Eus N/A 09/07/2014    Procedure: UPPER ENDOSCOPIC ULTRASOUND (EUS) LINEAR;  Surgeon: Milus Banister, MD;  Location: [facility];  Service: Endoscopy;  Laterality: N/A;  . Whipple procedure     Family History  Problem Relation Age of Onset  . Coronary artery disease Mother   . Stroke Father   . Diabetes Mellitus II Father   . Colon cancer Neg Hx    Social  History  Substance Use Topics  . Smoking status: Never Smoker   . Smokeless tobacco: Never Used  . Alcohol Use: No   OB History    Gravida Para Term Preterm AB TAB SAB Ectopic Multiple Living   2 2 2       2      Review of Systems  Constitutional: Positive for fever, chills, appetite change and fatigue.  HENT: Negative for congestion, postnasal drip and rhinorrhea.   Eyes: Negative for pain and visual disturbance.  Respiratory: Negative for cough, chest tightness and shortness of breath.   Cardiovascular: Negative for chest pain and palpitations.  Gastrointestinal: Positive for nausea and abdominal pain (abdominal wall). Negative for vomiting and diarrhea.  Genitourinary: Negative for dysuria, urgency and decreased urine volume.  Musculoskeletal: Negative for myalgias and back pain.  Skin: Positive for rash (redness and warmth of the skin of the abdomen).  Neurological: Negative for dizziness, light-headedness and headaches.  Hematological: Does not bruise/bleed easily.      Allergies  Biaxin; Codeine; Metronidazole; Cephalosporins; and Keflex  Home Medications   Prior to Admission medications   Medication Sig Start Date End Date Taking? Authorizing Provider  albuterol (PROVENTIL HFA;VENTOLIN HFA) 108 (90 BASE) MCG/ACT inhaler Inhale 2 puffs into the lungs every 6 (six)  hours as needed for wheezing.   Yes Historical Provider, MD  ALPRAZolam Duanne Moron) 0.25 MG tablet Take 0.25 mg by mouth at bedtime as needed for anxiety.   Yes Historical Provider, MD  canagliflozin (INVOKANA) 300 MG TABS tablet Take 1 tablet by mouth daily.   Yes Historical Provider, MD  ciclesonide (ALVESCO) 160 MCG/ACT inhaler Inhale 1 puff into the lungs 2 (two) times daily.   Yes Historical Provider, MD  fexofenadine (ALLEGRA) 180 MG tablet Take 180 mg by mouth daily as needed for allergies or rhinitis.   Yes Historical Provider, MD  omeprazole (PRILOSEC) 20 MG capsule Take 1 capsule (20 mg total) by mouth  daily. 05/04/14  Yes Davonna Belling, MD  senna-docusate (SENOKOT-S) 8.6-50 MG tablet Take 1 tablet by mouth at bedtime. 01/11/15  Yes Historical Provider, MD  sucralfate (CARAFATE) 1 GM/10ML suspension Take 1 g by mouth 4 (four) times daily.   Yes Historical Provider, MD  valACYclovir (VALTREX) 500 MG tablet Take 500 mg by mouth daily as needed (outbreak).    Yes Historical Provider, MD   BP 125/65 mmHg  Pulse 108  Temp(Src) 98.8 F (37.1 C) (Oral)  Resp 26  Ht 5\' 1"  (1.549 m)  Wt 150 lb (68.04 kg)  BMI 28.36 kg/m2  SpO2 98% Physical Exam  Constitutional: She is oriented to person, place, and time. No distress.  HENT:  Head: Normocephalic and atraumatic.  Right Ear: External ear normal.  Left Ear: External ear normal.  Mouth/Throat: Oropharynx is clear and moist. No oropharyngeal exudate.  Eyes: EOM are normal. Pupils are equal, round, and reactive to light.  Neck: Normal range of motion. Neck supple.  Cardiovascular: Regular rhythm.  Tachycardia present.   Pulmonary/Chest: Effort normal. No respiratory distress. She has no wheezes. She has no rales.  Abdominal: Soft. She exhibits no distension.    Musculoskeletal: Normal range of motion. She exhibits no tenderness.  Neurological: She is alert and oriented to person, place, and time.  Skin: She is not diaphoretic. There is erythema (over abdomen as described above).    ED Course  Procedures (including critical care time) Labs Review Labs Reviewed  COMPREHENSIVE METABOLIC PANEL - Abnormal; Notable for the following:    Sodium 133 (*)    Potassium 3.1 (*)    CO2 16 (*)    Glucose, Bld 172 (*)    Albumin 2.6 (*)    AST 13 (*)    Total Bilirubin 1.3 (*)    Anion gap 16 (*)    All other components within normal limits  CBC WITH DIFFERENTIAL/PLATELET - Abnormal; Notable for the following:    WBC 19.8 (*)    RBC 3.58 (*)    Hemoglobin 10.4 (*)    HCT 31.2 (*)    Platelets 485 (*)    Neutro Abs 17.1 (*)    Monocytes  Absolute 1.2 (*)    All other components within normal limits  URINALYSIS, ROUTINE W REFLEX MICROSCOPIC (NOT AT [facility]) - Abnormal; Notable for the following:    Specific Gravity, Urine >1.030 (*)    Glucose, UA 500 (*)    Hgb urine dipstick SMALL (*)    Bilirubin Urine MODERATE (*)    Ketones, ur >80 (*)    Protein, ur 30 (*)    All other components within normal limits  URINE MICROSCOPIC-ADD ON - Abnormal; Notable for the following:    Squamous Epithelial / LPF FEW (*)    Bacteria, UA MANY (*)    All other  components within normal limits  CULTURE, BLOOD (ROUTINE X 2)  CULTURE, BLOOD (ROUTINE X 2)  URINE CULTURE  LACTIC ACID, PLASMA  LACTIC ACID, PLASMA    Imaging Review No results found. I have personally reviewed and evaluated these images and lab results as part of my medical decision-making.   EKG Interpretation None      MDM  Patient seen and evaluated in stable condition.  Patient with abdominal wall cellulitis, no sign of abscess.  Patient with tachycardia, leukocytosis.  Afebrile.  Patient started on Vanc/Zosyn.  Hydrated with IV fluids.  Blood cultures sent.  Discussed case with Dr. Eugenia Pancoast on the phone who said that he would recommend CT for evaluation for deeper infection/abscess but would discuss with patient option of transfer to [facility].  Patient and her daughter requested transfer to continue care with Dr. Gabriel Rainwater team partaking in care.  CT to be completed after transfer so imaging available to team caring for patient at [facility].  Discussed case with Dr. Lafonda Mosses in the [facility] ED who agreed with patient transfer and patient was transferred in stable condition. Final diagnoses:  None    1. Abdominal wall cellulitis    Harvel Quale, MD 02/04/15 [phone]

## 2015-02-05 LAB — URINE CULTURE: CULTURE: NO GROWTH

## 2015-02-08 DIAGNOSIS — K5790 Diverticulosis of intestine, part unspecified, without perforation or abscess without bleeding: Secondary | ICD-10-CM | POA: Diagnosis not present

## 2015-02-08 DIAGNOSIS — Z90411 Acquired partial absence of pancreas: Secondary | ICD-10-CM | POA: Diagnosis not present

## 2015-02-08 DIAGNOSIS — R7303 Prediabetes: Secondary | ICD-10-CM | POA: Diagnosis not present

## 2015-02-08 DIAGNOSIS — J45909 Unspecified asthma, uncomplicated: Secondary | ICD-10-CM | POA: Diagnosis not present

## 2015-02-08 DIAGNOSIS — K219 Gastro-esophageal reflux disease without esophagitis: Secondary | ICD-10-CM | POA: Diagnosis not present

## 2015-02-08 DIAGNOSIS — K6811 Postprocedural retroperitoneal abscess: Secondary | ICD-10-CM | POA: Diagnosis not present

## 2015-02-09 LAB — CULTURE, BLOOD (ROUTINE X 2)
Culture: NO GROWTH
Culture: NO GROWTH

## 2015-02-12 DIAGNOSIS — E119 Type 2 diabetes mellitus without complications: Secondary | ICD-10-CM | POA: Diagnosis not present

## 2015-02-12 DIAGNOSIS — F329 Major depressive disorder, single episode, unspecified: Secondary | ICD-10-CM | POA: Diagnosis not present

## 2015-02-12 DIAGNOSIS — T814XXA Infection following a procedure, initial encounter: Secondary | ICD-10-CM | POA: Diagnosis not present

## 2015-02-12 DIAGNOSIS — J45909 Unspecified asthma, uncomplicated: Secondary | ICD-10-CM | POA: Diagnosis not present

## 2015-02-13 DIAGNOSIS — K862 Cyst of pancreas: Secondary | ICD-10-CM | POA: Diagnosis not present

## 2015-02-14 DIAGNOSIS — J45909 Unspecified asthma, uncomplicated: Secondary | ICD-10-CM | POA: Diagnosis not present

## 2015-02-14 DIAGNOSIS — Z90411 Acquired partial absence of pancreas: Secondary | ICD-10-CM | POA: Diagnosis not present

## 2015-02-14 DIAGNOSIS — K219 Gastro-esophageal reflux disease without esophagitis: Secondary | ICD-10-CM | POA: Diagnosis not present

## 2015-02-14 DIAGNOSIS — K6811 Postprocedural retroperitoneal abscess: Secondary | ICD-10-CM | POA: Diagnosis not present

## 2015-02-14 DIAGNOSIS — K5790 Diverticulosis of intestine, part unspecified, without perforation or abscess without bleeding: Secondary | ICD-10-CM | POA: Diagnosis not present

## 2015-02-14 DIAGNOSIS — R7303 Prediabetes: Secondary | ICD-10-CM | POA: Diagnosis not present

## 2015-02-15 DIAGNOSIS — R7303 Prediabetes: Secondary | ICD-10-CM | POA: Diagnosis not present

## 2015-02-15 DIAGNOSIS — Z90411 Acquired partial absence of pancreas: Secondary | ICD-10-CM | POA: Diagnosis not present

## 2015-02-15 DIAGNOSIS — K219 Gastro-esophageal reflux disease without esophagitis: Secondary | ICD-10-CM | POA: Diagnosis not present

## 2015-02-15 DIAGNOSIS — K5790 Diverticulosis of intestine, part unspecified, without perforation or abscess without bleeding: Secondary | ICD-10-CM | POA: Diagnosis not present

## 2015-02-15 DIAGNOSIS — K6811 Postprocedural retroperitoneal abscess: Secondary | ICD-10-CM | POA: Diagnosis not present

## 2015-02-15 DIAGNOSIS — J45909 Unspecified asthma, uncomplicated: Secondary | ICD-10-CM | POA: Diagnosis not present

## 2015-02-17 DIAGNOSIS — Z90411 Acquired partial absence of pancreas: Secondary | ICD-10-CM | POA: Diagnosis not present

## 2015-02-17 DIAGNOSIS — K219 Gastro-esophageal reflux disease without esophagitis: Secondary | ICD-10-CM | POA: Diagnosis not present

## 2015-02-17 DIAGNOSIS — K5790 Diverticulosis of intestine, part unspecified, without perforation or abscess without bleeding: Secondary | ICD-10-CM | POA: Diagnosis not present

## 2015-02-17 DIAGNOSIS — K6811 Postprocedural retroperitoneal abscess: Secondary | ICD-10-CM | POA: Diagnosis not present

## 2015-02-17 DIAGNOSIS — J45909 Unspecified asthma, uncomplicated: Secondary | ICD-10-CM | POA: Diagnosis not present

## 2015-02-17 DIAGNOSIS — R7303 Prediabetes: Secondary | ICD-10-CM | POA: Diagnosis not present

## 2015-02-19 DIAGNOSIS — K5790 Diverticulosis of intestine, part unspecified, without perforation or abscess without bleeding: Secondary | ICD-10-CM | POA: Diagnosis not present

## 2015-02-19 DIAGNOSIS — Z90411 Acquired partial absence of pancreas: Secondary | ICD-10-CM | POA: Diagnosis not present

## 2015-02-19 DIAGNOSIS — K219 Gastro-esophageal reflux disease without esophagitis: Secondary | ICD-10-CM | POA: Diagnosis not present

## 2015-02-19 DIAGNOSIS — R7303 Prediabetes: Secondary | ICD-10-CM | POA: Diagnosis not present

## 2015-02-19 DIAGNOSIS — J45909 Unspecified asthma, uncomplicated: Secondary | ICD-10-CM | POA: Diagnosis not present

## 2015-02-19 DIAGNOSIS — K6811 Postprocedural retroperitoneal abscess: Secondary | ICD-10-CM | POA: Diagnosis not present

## 2015-02-21 DIAGNOSIS — J45909 Unspecified asthma, uncomplicated: Secondary | ICD-10-CM | POA: Diagnosis not present

## 2015-02-21 DIAGNOSIS — K6811 Postprocedural retroperitoneal abscess: Secondary | ICD-10-CM | POA: Diagnosis not present

## 2015-02-21 DIAGNOSIS — R7303 Prediabetes: Secondary | ICD-10-CM | POA: Diagnosis not present

## 2015-02-21 DIAGNOSIS — Z90411 Acquired partial absence of pancreas: Secondary | ICD-10-CM | POA: Diagnosis not present

## 2015-02-21 DIAGNOSIS — K5790 Diverticulosis of intestine, part unspecified, without perforation or abscess without bleeding: Secondary | ICD-10-CM | POA: Diagnosis not present

## 2015-02-21 DIAGNOSIS — K219 Gastro-esophageal reflux disease without esophagitis: Secondary | ICD-10-CM | POA: Diagnosis not present

## 2015-02-23 DIAGNOSIS — K219 Gastro-esophageal reflux disease without esophagitis: Secondary | ICD-10-CM | POA: Diagnosis not present

## 2015-02-23 DIAGNOSIS — J45909 Unspecified asthma, uncomplicated: Secondary | ICD-10-CM | POA: Diagnosis not present

## 2015-02-23 DIAGNOSIS — Z90411 Acquired partial absence of pancreas: Secondary | ICD-10-CM | POA: Diagnosis not present

## 2015-02-23 DIAGNOSIS — K5790 Diverticulosis of intestine, part unspecified, without perforation or abscess without bleeding: Secondary | ICD-10-CM | POA: Diagnosis not present

## 2015-02-23 DIAGNOSIS — R7303 Prediabetes: Secondary | ICD-10-CM | POA: Diagnosis not present

## 2015-02-23 DIAGNOSIS — K6811 Postprocedural retroperitoneal abscess: Secondary | ICD-10-CM | POA: Diagnosis not present

## 2015-02-26 DIAGNOSIS — R7303 Prediabetes: Secondary | ICD-10-CM | POA: Diagnosis not present

## 2015-02-26 DIAGNOSIS — K219 Gastro-esophageal reflux disease without esophagitis: Secondary | ICD-10-CM | POA: Diagnosis not present

## 2015-02-26 DIAGNOSIS — Z90411 Acquired partial absence of pancreas: Secondary | ICD-10-CM | POA: Diagnosis not present

## 2015-02-26 DIAGNOSIS — J45909 Unspecified asthma, uncomplicated: Secondary | ICD-10-CM | POA: Diagnosis not present

## 2015-02-26 DIAGNOSIS — K6811 Postprocedural retroperitoneal abscess: Secondary | ICD-10-CM | POA: Diagnosis not present

## 2015-02-26 DIAGNOSIS — K5790 Diverticulosis of intestine, part unspecified, without perforation or abscess without bleeding: Secondary | ICD-10-CM | POA: Diagnosis not present

## 2015-02-27 DIAGNOSIS — K5790 Diverticulosis of intestine, part unspecified, without perforation or abscess without bleeding: Secondary | ICD-10-CM | POA: Diagnosis not present

## 2015-02-27 DIAGNOSIS — K6811 Postprocedural retroperitoneal abscess: Secondary | ICD-10-CM | POA: Diagnosis not present

## 2015-02-27 DIAGNOSIS — Z90411 Acquired partial absence of pancreas: Secondary | ICD-10-CM | POA: Diagnosis not present

## 2015-02-27 DIAGNOSIS — R7303 Prediabetes: Secondary | ICD-10-CM | POA: Diagnosis not present

## 2015-02-27 DIAGNOSIS — J45909 Unspecified asthma, uncomplicated: Secondary | ICD-10-CM | POA: Diagnosis not present

## 2015-02-27 DIAGNOSIS — K219 Gastro-esophageal reflux disease without esophagitis: Secondary | ICD-10-CM | POA: Diagnosis not present

## 2015-02-28 DIAGNOSIS — R7303 Prediabetes: Secondary | ICD-10-CM | POA: Diagnosis not present

## 2015-02-28 DIAGNOSIS — K6811 Postprocedural retroperitoneal abscess: Secondary | ICD-10-CM | POA: Diagnosis not present

## 2015-02-28 DIAGNOSIS — K219 Gastro-esophageal reflux disease without esophagitis: Secondary | ICD-10-CM | POA: Diagnosis not present

## 2015-02-28 DIAGNOSIS — Z90411 Acquired partial absence of pancreas: Secondary | ICD-10-CM | POA: Diagnosis not present

## 2015-02-28 DIAGNOSIS — J45909 Unspecified asthma, uncomplicated: Secondary | ICD-10-CM | POA: Diagnosis not present

## 2015-02-28 DIAGNOSIS — K5790 Diverticulosis of intestine, part unspecified, without perforation or abscess without bleeding: Secondary | ICD-10-CM | POA: Diagnosis not present

## 2015-03-01 DIAGNOSIS — K8681 Exocrine pancreatic insufficiency: Secondary | ICD-10-CM | POA: Diagnosis not present

## 2015-03-01 DIAGNOSIS — K219 Gastro-esophageal reflux disease without esophagitis: Secondary | ICD-10-CM | POA: Diagnosis not present

## 2015-03-01 DIAGNOSIS — E119 Type 2 diabetes mellitus without complications: Secondary | ICD-10-CM | POA: Diagnosis not present

## 2015-03-01 DIAGNOSIS — R7303 Prediabetes: Secondary | ICD-10-CM | POA: Diagnosis not present

## 2015-03-01 DIAGNOSIS — F329 Major depressive disorder, single episode, unspecified: Secondary | ICD-10-CM | POA: Diagnosis not present

## 2015-03-01 DIAGNOSIS — K5790 Diverticulosis of intestine, part unspecified, without perforation or abscess without bleeding: Secondary | ICD-10-CM | POA: Diagnosis not present

## 2015-03-01 DIAGNOSIS — J45909 Unspecified asthma, uncomplicated: Secondary | ICD-10-CM | POA: Diagnosis not present

## 2015-03-01 DIAGNOSIS — K6811 Postprocedural retroperitoneal abscess: Secondary | ICD-10-CM | POA: Diagnosis not present

## 2015-03-01 DIAGNOSIS — J449 Chronic obstructive pulmonary disease, unspecified: Secondary | ICD-10-CM | POA: Diagnosis not present

## 2015-03-01 DIAGNOSIS — Z23 Encounter for immunization: Secondary | ICD-10-CM | POA: Diagnosis not present

## 2015-03-01 DIAGNOSIS — Z90411 Acquired partial absence of pancreas: Secondary | ICD-10-CM | POA: Diagnosis not present

## 2015-03-05 DIAGNOSIS — K219 Gastro-esophageal reflux disease without esophagitis: Secondary | ICD-10-CM | POA: Diagnosis not present

## 2015-03-05 DIAGNOSIS — J45909 Unspecified asthma, uncomplicated: Secondary | ICD-10-CM | POA: Diagnosis not present

## 2015-03-05 DIAGNOSIS — K6811 Postprocedural retroperitoneal abscess: Secondary | ICD-10-CM | POA: Diagnosis not present

## 2015-03-05 DIAGNOSIS — Z90411 Acquired partial absence of pancreas: Secondary | ICD-10-CM | POA: Diagnosis not present

## 2015-03-05 DIAGNOSIS — R7303 Prediabetes: Secondary | ICD-10-CM | POA: Diagnosis not present

## 2015-03-05 DIAGNOSIS — K5790 Diverticulosis of intestine, part unspecified, without perforation or abscess without bleeding: Secondary | ICD-10-CM | POA: Diagnosis not present

## 2015-03-06 DIAGNOSIS — K862 Cyst of pancreas: Secondary | ICD-10-CM | POA: Diagnosis not present

## 2015-03-07 DIAGNOSIS — J45909 Unspecified asthma, uncomplicated: Secondary | ICD-10-CM | POA: Diagnosis not present

## 2015-03-07 DIAGNOSIS — R7303 Prediabetes: Secondary | ICD-10-CM | POA: Diagnosis not present

## 2015-03-07 DIAGNOSIS — Z90411 Acquired partial absence of pancreas: Secondary | ICD-10-CM | POA: Diagnosis not present

## 2015-03-07 DIAGNOSIS — K5790 Diverticulosis of intestine, part unspecified, without perforation or abscess without bleeding: Secondary | ICD-10-CM | POA: Diagnosis not present

## 2015-03-07 DIAGNOSIS — K219 Gastro-esophageal reflux disease without esophagitis: Secondary | ICD-10-CM | POA: Diagnosis not present

## 2015-03-07 DIAGNOSIS — K6811 Postprocedural retroperitoneal abscess: Secondary | ICD-10-CM | POA: Diagnosis not present

## 2015-04-02 DIAGNOSIS — J449 Chronic obstructive pulmonary disease, unspecified: Secondary | ICD-10-CM | POA: Diagnosis not present

## 2015-04-02 DIAGNOSIS — E119 Type 2 diabetes mellitus without complications: Secondary | ICD-10-CM | POA: Diagnosis not present

## 2015-04-02 DIAGNOSIS — M542 Cervicalgia: Secondary | ICD-10-CM | POA: Diagnosis not present

## 2015-05-04 ENCOUNTER — Emergency Department (HOSPITAL_COMMUNITY): Payer: Medicare Other

## 2015-05-04 ENCOUNTER — Emergency Department (HOSPITAL_COMMUNITY)
Admission: EM | Admit: 2015-05-04 | Discharge: 2015-05-04 | Disposition: A | Discharge status: Home / Self Care | Payer: Medicare Other | Attending: Emergency Medicine | Admitting: Emergency Medicine

## 2015-05-04 ENCOUNTER — Encounter (HOSPITAL_COMMUNITY): Payer: Self-pay | Admitting: Emergency Medicine

## 2015-05-04 DIAGNOSIS — E119 Type 2 diabetes mellitus without complications: Secondary | ICD-10-CM | POA: Insufficient documentation

## 2015-05-04 DIAGNOSIS — R079 Chest pain, unspecified: Secondary | ICD-10-CM | POA: Insufficient documentation

## 2015-05-04 DIAGNOSIS — Z7951 Long term (current) use of inhaled steroids: Secondary | ICD-10-CM | POA: Insufficient documentation

## 2015-05-04 DIAGNOSIS — Z79899 Other long term (current) drug therapy: Secondary | ICD-10-CM | POA: Insufficient documentation

## 2015-05-04 DIAGNOSIS — F329 Major depressive disorder, single episode, unspecified: Secondary | ICD-10-CM | POA: Diagnosis not present

## 2015-05-04 DIAGNOSIS — K219 Gastro-esophageal reflux disease without esophagitis: Secondary | ICD-10-CM | POA: Insufficient documentation

## 2015-05-04 DIAGNOSIS — J45901 Unspecified asthma with (acute) exacerbation: Secondary | ICD-10-CM | POA: Insufficient documentation

## 2015-05-04 DIAGNOSIS — Z7984 Long term (current) use of oral hypoglycemic drugs: Secondary | ICD-10-CM | POA: Diagnosis not present

## 2015-05-04 DIAGNOSIS — R109 Unspecified abdominal pain: Secondary | ICD-10-CM | POA: Diagnosis present

## 2015-05-04 DIAGNOSIS — Z87442 Personal history of urinary calculi: Secondary | ICD-10-CM | POA: Diagnosis not present

## 2015-05-04 DIAGNOSIS — R945 Abnormal results of liver function studies: Secondary | ICD-10-CM | POA: Diagnosis not present

## 2015-05-04 DIAGNOSIS — Z9851 Tubal ligation status: Secondary | ICD-10-CM | POA: Insufficient documentation

## 2015-05-04 DIAGNOSIS — R1013 Epigastric pain: Secondary | ICD-10-CM | POA: Insufficient documentation

## 2015-05-04 DIAGNOSIS — R61 Generalized hyperhidrosis: Secondary | ICD-10-CM | POA: Insufficient documentation

## 2015-05-04 DIAGNOSIS — F419 Anxiety disorder, unspecified: Secondary | ICD-10-CM | POA: Insufficient documentation

## 2015-05-04 DIAGNOSIS — Z853 Personal history of malignant neoplasm of breast: Secondary | ICD-10-CM | POA: Diagnosis not present

## 2015-05-04 DIAGNOSIS — R0602 Shortness of breath: Secondary | ICD-10-CM | POA: Diagnosis not present

## 2015-05-04 DIAGNOSIS — R072 Precordial pain: Secondary | ICD-10-CM | POA: Diagnosis not present

## 2015-05-04 DIAGNOSIS — I2510 Atherosclerotic heart disease of native coronary artery without angina pectoris: Secondary | ICD-10-CM | POA: Diagnosis not present

## 2015-05-04 LAB — CBC
HCT: 36.7 % (ref 36.0–46.0)
Hemoglobin: 11.7 g/dL — ABNORMAL LOW (ref 12.0–15.0)
MCH: 27.6 pg (ref 26.0–34.0)
MCHC: 31.9 g/dL (ref 30.0–36.0)
MCV: 86.6 fL (ref 78.0–100.0)
PLATELETS: 311 10*3/uL (ref 150–400)
RBC: 4.24 MIL/uL (ref 3.87–5.11)
RDW: 16 % — AB (ref 11.5–15.5)
WBC: 6.2 10*3/uL (ref 4.0–10.5)

## 2015-05-04 LAB — BASIC METABOLIC PANEL
Anion gap: 9 (ref 5–15)
BUN: 14 mg/dL (ref 6–20)
CALCIUM: 8.7 mg/dL — AB (ref 8.9–10.3)
CHLORIDE: 105 mmol/L (ref 101–111)
CO2: 26 mmol/L (ref 22–32)
CREATININE: 0.67 mg/dL (ref 0.44–1.00)
GFR calc non Af Amer: >60 mL/min (ref >60)
Glucose, Bld: 248 mg/dL — ABNORMAL HIGH (ref 65–99)
Potassium: 2.9 mmol/L — ABNORMAL LOW (ref 3.5–5.1)
SODIUM: 140 mmol/L (ref 135–145)

## 2015-05-04 LAB — TROPONIN I

## 2015-05-04 LAB — HEPATIC FUNCTION PANEL
ALBUMIN: 3.3 g/dL — AB (ref 3.5–5.0)
ALK PHOS: 136 U/L — AB (ref 38–126)
ALT: 89 U/L — ABNORMAL HIGH (ref 14–54)
AST: 211 U/L — ABNORMAL HIGH (ref 15–41)
BILIRUBIN INDIRECT: 0.7 mg/dL (ref 0.3–0.9)
BILIRUBIN TOTAL: 0.9 mg/dL (ref 0.3–1.2)
Bilirubin, Direct: 0.2 mg/dL (ref 0.1–0.5)
TOTAL PROTEIN: 6.7 g/dL (ref 6.5–8.1)

## 2015-05-04 LAB — LIPASE, BLOOD: Lipase: 19 U/L (ref 11–51)

## 2015-05-04 MED ORDER — HYDROCODONE-ACETAMINOPHEN 5-325 MG PO TABS: ORAL_TABLET | ORAL | Status: DC

## 2015-05-04 MED ORDER — POTASSIUM CHLORIDE CRYS ER 20 MEQ PO TBCR
20.0000 meq | EXTENDED_RELEASE_TABLET | Freq: Every day | ORAL | Status: DC
Start: 2015-05-04 — End: 2016-10-07

## 2015-05-04 MED ORDER — IOHEXOL 300 MG/ML  SOLN
100.0000 mL | Freq: Once | INTRAMUSCULAR | Status: AC | PRN
  Administered 2015-05-04: 100 mL via INTRAVENOUS

## 2015-05-04 MED ORDER — POTASSIUM CHLORIDE CRYS ER 20 MEQ PO TBCR
40.0000 meq | EXTENDED_RELEASE_TABLET | Freq: Once | ORAL | Status: AC
  Administered 2015-05-04: 40 meq via ORAL
  Filled 2015-05-04: qty 2

## 2015-05-04 NOTE — ED Notes (Signed)
MD at bedside. 

## 2015-05-04 NOTE — ED Provider Notes (Signed)
CSN: [id number]     Arrival date & time 05/04/15  0944 History  By signing my name below, I, Betty Nguyen, attest that this documentation has been prepared under the direction and in the presence of Betty Ferguson, MD. Electronically Signed: Meriel Nguyen, ED Scribe. 05/04/2015. 10:24 AM.   Chief Complaint  Patient presents with  . Chest Pain   The history is provided by the patient. No language interpreter was used.   HPI Comments: Betty Nguyen is a 75 y.o. female, with a PMhx of HLD and DM II, who presents to the Emergency Department complaining of sudden onset, 8/10 substernal chest pain that radiated through to her upper back and lasted for 20 minutes but has resolved. She associates tachypnea but denies SOB and diaphoresis. She states the episode of chest pain this morning was different than her past anxiety attacks. Pt notes she underwent a whipple procedure approximately 9 months ago due to 75% risk for pancreatic cancer and has been experiencing increased gas recently but has no other complaints.   Past Medical History  Diagnosis Date  . Asthma   . Nephrolithiasis   . Gastroesophageal reflux   . Diverticulitis 2012    Treated medically  . Chest pain   . Anxiety and depression   . Hyperlipidemia   . Diabetes mellitus type II, controlled ([place])     diet controlled,exercise, no meds  . Depression   . Anxiety   . Breast carcinoma ([place]) 08/2009    Invasive ductal; left; lumpectomy and sentinel node excision- oncology visits yearly.   Past Surgical History  Procedure Laterality Date  . Cesarean section    . Tubal ligation    . Nephrolithotomy    . Dilation and curettage of uterus    . Colonoscopy  2004  . Breast lumpectomy      Left  . Eus N/A 09/07/2014    Procedure: UPPER ENDOSCOPIC ULTRASOUND (EUS) LINEAR;  Surgeon: Milus Banister, MD;  Location: [facility];  Service: Endoscopy;  Laterality: N/A;  . Whipple procedure    . Cholecystectomy     Family History   Problem Relation Age of Onset  . Coronary artery disease Mother   . Stroke Father   . Diabetes Mellitus II Father   . Colon cancer Neg Hx    Social History  Substance Use Topics  . Smoking status: Never Smoker   . Smokeless tobacco: Never Used  . Alcohol Use: No   OB History    Gravida Para Term Preterm AB TAB SAB Ectopic Multiple Living   2 2 2       2      Review of Systems  Constitutional: Negative for diaphoresis.  Respiratory: Negative for shortness of breath.   Cardiovascular: Positive for chest pain.  All other systems reviewed and are negative.  Allergies  Biaxin; Codeine; Metronidazole; Cephalosporins; and Keflex  Home Medications   Prior to Admission medications   Medication Sig Start Date End Date Taking? Authorizing Provider  albuterol (PROVENTIL HFA;VENTOLIN HFA) 108 (90 BASE) MCG/ACT inhaler Inhale 2 puffs into the lungs every 6 (six) hours as needed for wheezing.    Historical Provider, MD  ALPRAZolam Duanne Moron) 0.25 MG tablet Take 0.25 mg by mouth at bedtime as needed for anxiety.    Historical Provider, MD  canagliflozin (INVOKANA) 300 MG TABS tablet Take 1 tablet by mouth daily.    Historical Provider, MD  ciclesonide (ALVESCO) 160 MCG/ACT inhaler Inhale 1 puff into the lungs 2 (two)  times daily.    Historical Provider, MD  fexofenadine (ALLEGRA) 180 MG tablet Take 180 mg by mouth daily as needed for allergies or rhinitis.    Historical Provider, MD  omeprazole (PRILOSEC) 20 MG capsule Take 1 capsule (20 mg total) by mouth daily. 05/04/14   Davonna Belling, MD  senna-docusate (SENOKOT-S) 8.6-50 MG tablet Take 1 tablet by mouth at bedtime. 01/11/15   Historical Provider, MD  sucralfate (CARAFATE) 1 GM/10ML suspension Take 1 g by mouth 4 (four) times daily.    Historical Provider, MD  valACYclovir (VALTREX) 500 MG tablet Take 500 mg by mouth daily as needed (outbreak).     Historical Provider, MD   BP 126/61 mmHg  Pulse 77  Temp(Src) 97.7 F (36.5 C) (Oral)   Resp 16  Ht 5' 1.25" (1.556 m)  Wt 130 lb (58.968 kg)  BMI 24.36 kg/m2  SpO2 100% Physical Exam  Constitutional: She is oriented to person, place, and time. She appears well-developed.  HENT:  Head: Normocephalic.  Eyes: Conjunctivae and EOM are normal. No scleral icterus.  Neck: Neck supple. No thyromegaly present.  Cardiovascular: Normal rate and regular rhythm.  Exam reveals no gallop and no friction rub.   No murmur heard. Pulmonary/Chest: No stridor. She has no wheezes. She has no rales. She exhibits no tenderness.  Abdominal: She exhibits no distension. There is no tenderness. There is no rebound.  Musculoskeletal: Normal range of motion. She exhibits no edema.  Lymphadenopathy:    She has no cervical adenopathy.  Neurological: She is oriented to person, place, and time. She exhibits normal muscle tone. Coordination normal.  Skin: No rash noted. No erythema.  Psychiatric: She has a normal mood and affect. Her behavior is normal.    ED Course  Procedures  DIAGNOSTIC STUDIES: Oxygen Saturation is 100% on RA, normal by my interpretation.    COORDINATION OF CARE: 10:20 AM Discussed treatment plan which includes to order cardiac workup with pt. Pt acknowledges and agrees to plan.   Labs Review Labs Reviewed  BASIC METABOLIC PANEL  CBC  TROPONIN I    Imaging Review No results found. I have personally reviewed and evaluated these images and lab results as part of my medical decision-making.   EKG Interpretation   Date/Time:  Friday May 04 2015 09:56:53 EST Ventricular Rate:  80 PR Interval:  125 QRS Duration: 83 QT Interval:  410 QTC Calculation: 473 R Axis:   52 Text Interpretation:  Sinus arrhythmia Abnormal R-wave progression, early  transition Confirmed by Verdis Koval  MD, Janalyn Higby ([id number]) on 05/04/2015 10:16:59 AM      MDM   Final diagnoses:  None    Patient with epigastric pain earlier today. Labs unremarkable except for mild elevation in liver studies.  Doubt coronary artery disease. Patient will increase her Prilosec to twice a day and she will follow-up with her PCP next week for the elevated liver studies.Urbano Heir, MD 05/04/15 [phone]

## 2015-05-04 NOTE — ED Notes (Signed)
Pt reports substernal cp since this morning radiating to back with sob.  Pt alert and oriented.

## 2015-05-04 NOTE — Discharge Instructions (Signed)
Follow up with your md next week to check your liver studies

## 2015-05-14 DIAGNOSIS — K8689 Other specified diseases of pancreas: Secondary | ICD-10-CM | POA: Diagnosis not present

## 2015-05-14 DIAGNOSIS — E876 Hypokalemia: Secondary | ICD-10-CM | POA: Diagnosis not present

## 2015-05-14 DIAGNOSIS — E119 Type 2 diabetes mellitus without complications: Secondary | ICD-10-CM | POA: Diagnosis not present

## 2015-05-14 DIAGNOSIS — R748 Abnormal levels of other serum enzymes: Secondary | ICD-10-CM | POA: Diagnosis not present

## 2015-05-14 DIAGNOSIS — K210 Gastro-esophageal reflux disease with esophagitis: Secondary | ICD-10-CM | POA: Diagnosis not present

## 2015-05-14 DIAGNOSIS — F419 Anxiety disorder, unspecified: Secondary | ICD-10-CM | POA: Diagnosis not present

## 2015-05-14 DIAGNOSIS — E559 Vitamin D deficiency, unspecified: Secondary | ICD-10-CM | POA: Diagnosis not present

## 2015-06-20 DIAGNOSIS — E876 Hypokalemia: Secondary | ICD-10-CM | POA: Diagnosis not present

## 2015-06-25 DIAGNOSIS — J45909 Unspecified asthma, uncomplicated: Secondary | ICD-10-CM | POA: Diagnosis not present

## 2015-06-25 DIAGNOSIS — K8681 Exocrine pancreatic insufficiency: Secondary | ICD-10-CM | POA: Diagnosis not present

## 2015-06-25 DIAGNOSIS — E1165 Type 2 diabetes mellitus with hyperglycemia: Secondary | ICD-10-CM | POA: Diagnosis not present

## 2015-06-25 DIAGNOSIS — J309 Allergic rhinitis, unspecified: Secondary | ICD-10-CM | POA: Diagnosis not present

## 2015-07-30 DIAGNOSIS — K432 Incisional hernia without obstruction or gangrene: Secondary | ICD-10-CM | POA: Diagnosis not present

## 2015-07-30 DIAGNOSIS — E119 Type 2 diabetes mellitus without complications: Secondary | ICD-10-CM | POA: Diagnosis not present

## 2015-08-10 DIAGNOSIS — K8681 Exocrine pancreatic insufficiency: Secondary | ICD-10-CM | POA: Diagnosis not present

## 2015-08-10 DIAGNOSIS — J45909 Unspecified asthma, uncomplicated: Secondary | ICD-10-CM | POA: Diagnosis not present

## 2015-08-10 DIAGNOSIS — E1165 Type 2 diabetes mellitus with hyperglycemia: Secondary | ICD-10-CM | POA: Diagnosis not present

## 2015-08-14 DIAGNOSIS — K432 Incisional hernia without obstruction or gangrene: Secondary | ICD-10-CM | POA: Diagnosis not present

## 2015-08-14 DIAGNOSIS — M5440 Lumbago with sciatica, unspecified side: Secondary | ICD-10-CM | POA: Diagnosis not present

## 2015-08-14 DIAGNOSIS — E119 Type 2 diabetes mellitus without complications: Secondary | ICD-10-CM | POA: Diagnosis not present

## 2015-08-14 DIAGNOSIS — K8681 Exocrine pancreatic insufficiency: Secondary | ICD-10-CM | POA: Diagnosis not present

## 2015-09-17 ENCOUNTER — Emergency Department (HOSPITAL_COMMUNITY): Payer: Medicare Other

## 2015-09-17 ENCOUNTER — Emergency Department (HOSPITAL_COMMUNITY)
Admission: EM | Admit: 2015-09-17 | Discharge: 2015-09-17 | Disposition: A | Discharge status: Home / Self Care | Payer: Medicare Other | Attending: Emergency Medicine | Admitting: Emergency Medicine

## 2015-09-17 ENCOUNTER — Encounter (HOSPITAL_COMMUNITY): Payer: Self-pay | Admitting: *Deleted

## 2015-09-17 DIAGNOSIS — R110 Nausea: Secondary | ICD-10-CM | POA: Insufficient documentation

## 2015-09-17 DIAGNOSIS — R1013 Epigastric pain: Secondary | ICD-10-CM | POA: Diagnosis not present

## 2015-09-17 DIAGNOSIS — E119 Type 2 diabetes mellitus without complications: Secondary | ICD-10-CM | POA: Diagnosis not present

## 2015-09-17 DIAGNOSIS — E785 Hyperlipidemia, unspecified: Secondary | ICD-10-CM | POA: Insufficient documentation

## 2015-09-17 DIAGNOSIS — F329 Major depressive disorder, single episode, unspecified: Secondary | ICD-10-CM | POA: Insufficient documentation

## 2015-09-17 DIAGNOSIS — J45909 Unspecified asthma, uncomplicated: Secondary | ICD-10-CM | POA: Diagnosis not present

## 2015-09-17 DIAGNOSIS — R109 Unspecified abdominal pain: Secondary | ICD-10-CM | POA: Diagnosis not present

## 2015-09-17 LAB — COMPREHENSIVE METABOLIC PANEL
ALBUMIN: 4 g/dL (ref 3.5–5.0)
ALK PHOS: 115 U/L (ref 38–126)
ALT: 25 U/L (ref 14–54)
ANION GAP: 8 (ref 5–15)
AST: 16 U/L (ref 15–41)
BILIRUBIN TOTAL: 1.1 mg/dL (ref 0.3–1.2)
BUN: 16 mg/dL (ref 6–20)
CALCIUM: 9.1 mg/dL (ref 8.9–10.3)
CO2: 23 mmol/L (ref 22–32)
Chloride: 107 mmol/L (ref 101–111)
Creatinine, Ser: 0.6 mg/dL (ref 0.44–1.00)
GLUCOSE: 155 mg/dL — AB (ref 65–99)
POTASSIUM: 3.5 mmol/L (ref 3.5–5.1)
Sodium: 138 mmol/L (ref 135–145)
TOTAL PROTEIN: 7.8 g/dL (ref 6.5–8.1)

## 2015-09-17 LAB — CBC WITH DIFFERENTIAL/PLATELET
BASOS PCT: 0 %
Basophils Absolute: 0 10*3/uL (ref 0.0–0.1)
Eosinophils Absolute: 0 10*3/uL (ref 0.0–0.7)
Eosinophils Relative: 0 %
HEMATOCRIT: 39.6 % (ref 36.0–46.0)
HEMOGLOBIN: 12.8 g/dL (ref 12.0–15.0)
LYMPHS ABS: 1.6 10*3/uL (ref 0.7–4.0)
LYMPHS PCT: 20 %
MCH: 29.3 pg (ref 26.0–34.0)
MCHC: 32.3 g/dL (ref 30.0–36.0)
MCV: 90.6 fL (ref 78.0–100.0)
MONO ABS: 0.5 10*3/uL (ref 0.1–1.0)
MONOS PCT: 7 %
NEUTROS ABS: 5.7 10*3/uL (ref 1.7–7.7)
Neutrophils Relative %: 73 %
Platelets: 225 10*3/uL (ref 150–400)
RBC: 4.37 MIL/uL (ref 3.87–5.11)
RDW: 13.5 % (ref 11.5–15.5)
WBC: 7.9 10*3/uL (ref 4.0–10.5)

## 2015-09-17 LAB — URINE MICROSCOPIC-ADD ON
Bacteria, UA: NONE SEEN
WBC UA: NONE SEEN WBC/hpf (ref 0–5)

## 2015-09-17 LAB — URINALYSIS, ROUTINE W REFLEX MICROSCOPIC
BILIRUBIN URINE: NEGATIVE
Glucose, UA: >1000 mg/dL — AB
KETONES UR: 15 mg/dL — AB
LEUKOCYTES UA: NEGATIVE
NITRITE: NEGATIVE
PH: 5 (ref 5.0–8.0)
PROTEIN: NEGATIVE mg/dL
Specific Gravity, Urine: 1.02 (ref 1.005–1.030)

## 2015-09-17 LAB — LIPASE, BLOOD: LIPASE: 21 U/L (ref 11–51)

## 2015-09-17 MED ORDER — ONDANSETRON HCL 4 MG/2ML IJ SOLN
4.0000 mg | Freq: Once | INTRAMUSCULAR | Status: AC
  Administered 2015-09-17: 4 mg via INTRAVENOUS
  Filled 2015-09-17: qty 2

## 2015-09-17 MED ORDER — HYDROMORPHONE HCL 1 MG/ML IJ SOLN
0.5000 mg | Freq: Once | INTRAMUSCULAR | Status: AC
  Administered 2015-09-17: 0.5 mg via INTRAVENOUS
  Filled 2015-09-17: qty 1

## 2015-09-17 MED ORDER — PANTOPRAZOLE SODIUM 40 MG IV SOLR
40.0000 mg | Freq: Once | INTRAVENOUS | Status: AC
  Administered 2015-09-17: 40 mg via INTRAVENOUS
  Filled 2015-09-17: qty 40

## 2015-09-17 MED ORDER — DICYCLOMINE HCL 20 MG PO TABS
20.0000 mg | ORAL_TABLET | Freq: Two times a day (BID) | ORAL | Status: DC | PRN
Start: 2015-09-17 — End: 2016-10-07

## 2015-09-17 MED ORDER — MORPHINE SULFATE (PF) 4 MG/ML IV SOLN
4.0000 mg | INTRAVENOUS | Status: DC | PRN
  Administered 2015-09-17: 4 mg via INTRAVENOUS
  Filled 2015-09-17: qty 1

## 2015-09-17 MED ORDER — IOPAMIDOL (ISOVUE-300) INJECTION 61%
100.0000 mL | Freq: Once | INTRAVENOUS | Status: AC | PRN
  Administered 2015-09-17: 100 mL via INTRAVENOUS

## 2015-09-17 NOTE — ED Notes (Signed)
Water given to pt upon request.

## 2015-09-17 NOTE — ED Notes (Signed)
MD at bedside. 

## 2015-09-17 NOTE — ED Notes (Signed)
Pt reports constant sharp abdominal pain that started last night about 1900-2000. Pt denies nausea, vomiting, trouble with urination. Pt does report loose stools x 2-3 weeks but she correlates that with starting to eat Activia yogurt. Pt reports taking Beano and Pepcid AC with little relief. Pt reports hx of abdominal surgery in Sept. 2016 that rerouted her pancreas and intestines due to high risk of pancreatic cancer with abdominal hernia that came after surgery.

## 2015-09-17 NOTE — ED Provider Notes (Signed)
CSN: [id number]     Arrival date & time 09/17/15  0703 History   First MD Initiated Contact with Patient 09/17/15 [phone]     Chief Complaint  Patient presents with  . Abdominal Pain     HPI  She presents for evaluation of abdominal pain. States symptoms started about 7 or 8:00 last night. Epigastric area umbilical. Cramping and intermittent. Nauseated but no vomiting. States she has a lot of belching a lot of gas. She explains in great detail she's had just significant amount of gas over the last several months to the point that she doesn't go out in public much. She has tried [place] yogurt, simethicone drops, and bean-o. Still gets a skin discomfort from gas. States typically sleeping, massaging her abdomen, or heating pad to relieve her symptoms today it did not.  Sig 15 history for multiple pancreatic cysts and I cancerous mass in the head of her pancreas. She underwent a Whipple procedure over a year ago. Has convalesced well from this. Apparently per her report the mass was "premalignant". She did not require adjunct chemotherapy or radiation.   Past Medical History  Diagnosis Date  . Asthma   . Nephrolithiasis   . Gastroesophageal reflux   . Diverticulitis 2012    Treated medically  . Chest pain   . Anxiety and depression   . Hyperlipidemia   . Diabetes mellitus type II, controlled ([place])     diet controlled,exercise, no meds  . Depression   . Anxiety   . Breast carcinoma ([place]) 08/2009    Invasive ductal; left; lumpectomy and sentinel node excision- oncology visits yearly.   Past Surgical History  Procedure Laterality Date  . Cesarean section    . Tubal ligation    . Nephrolithotomy    . Dilation and curettage of uterus    . Colonoscopy  2004  . Breast lumpectomy      Left  . Eus N/A 09/07/2014    Procedure: UPPER ENDOSCOPIC ULTRASOUND (EUS) LINEAR;  Surgeon: Milus Banister, MD;  Location: [facility];  Service: Endoscopy;  Laterality: N/A;  . Whipple procedure    .  Cholecystectomy     Family History  Problem Relation Age of Onset  . Coronary artery disease Mother   . Stroke Father   . Diabetes Mellitus II Father   . Colon cancer Neg Hx    Social History  Substance Use Topics  . Smoking status: Never Smoker   . Smokeless tobacco: Never Used  . Alcohol Use: No   OB History    Gravida Para Term Preterm AB TAB SAB Ectopic Multiple Living   2 2 2       2      Review of Systems  Constitutional: Negative for fever, chills, diaphoresis, appetite change and fatigue.  HENT: Negative for mouth sores, sore throat and trouble swallowing.   Eyes: Negative for visual disturbance.  Respiratory: Negative for cough, chest tightness, shortness of breath and wheezing.   Cardiovascular: Negative for chest pain.  Gastrointestinal: Positive for nausea and abdominal pain. Negative for vomiting, diarrhea and abdominal distention.       "Gas"  Endocrine: Negative for polydipsia, polyphagia and polyuria.  Genitourinary: Negative for dysuria, frequency and hematuria.  Musculoskeletal: Negative for gait problem.  Skin: Negative for color change, pallor and rash.  Neurological: Negative for dizziness, syncope, light-headedness and headaches.  Hematological: Does not bruise/bleed easily.  Psychiatric/Behavioral: Negative for behavioral problems and confusion.      Allergies  Biaxin; Codeine; Metronidazole; Cephalosporins; and Keflex  Home Medications   Prior to Admission medications   Medication Sig Start Date End Date Taking? Authorizing Provider  albuterol (PROVENTIL HFA;VENTOLIN HFA) 108 (90 BASE) MCG/ACT inhaler Inhale 2 puffs into the lungs every 6 (six) hours as needed for wheezing.    Historical Provider, MD  ALPRAZolam Duanne Moron) 0.25 MG tablet Take 0.25 mg by mouth at bedtime as needed for anxiety.    Historical Provider, MD  canagliflozin (INVOKANA) 300 MG TABS tablet Take 1 tablet by mouth daily.    Historical Provider, MD  ciclesonide (ALVESCO) 160  MCG/ACT inhaler Inhale 1 puff into the lungs 2 (two) times daily.    Historical Provider, MD  dicyclomine (BENTYL) 20 MG tablet Take 1 tablet (20 mg total) by mouth 2 (two) times daily as needed (abdominal cramping). 09/17/15   Tanna Furry, MD  fexofenadine (ALLEGRA) 180 MG tablet Take 180 mg by mouth daily as needed for allergies or rhinitis.    Historical Provider, MD  HYDROcodone-acetaminophen (NORCO/VICODIN) 5-325 MG tablet Take one every 6-8 hours for pain not helped by tylenol alone 05/04/15   Milton Ferguson, MD  omeprazole (PRILOSEC) 20 MG capsule Take 1 capsule (20 mg total) by mouth daily. 05/04/14   Davonna Belling, MD  OVER THE COUNTER MEDICATION Take 2 tablets by mouth daily. Probiotic gummies    Historical Provider, MD  potassium chloride SA (K-DUR,KLOR-CON) 20 MEQ tablet Take 1 tablet (20 mEq total) by mouth daily. 05/04/15   Milton Ferguson, MD  sucralfate (CARAFATE) 1 GM/10ML suspension Take 1 g by mouth 4 (four) times daily.    Historical Provider, MD  valACYclovir (VALTREX) 500 MG tablet Take 500 mg by mouth daily as needed (outbreak).     Historical Provider, MD   BP 130/79 mmHg  Pulse 86  Temp(Src) 98.8 F (37.1 C) (Oral)  Resp 18  Ht 5\' 1"  (1.549 m)  Wt 125 lb (56.7 kg)  BMI 23.63 kg/m2  SpO2 97% Physical Exam  Constitutional: She is oriented to person, place, and time. She appears well-developed and well-nourished.  Non-toxic appearance. She does not have a sickly appearance. She does not appear ill. No distress.  Thin, But otherwise healthy appearing elderly female. Awake and alert. Does not appear acutely ill.  HENT:  Head: Normocephalic.  Eyes: Conjunctivae are normal. Pupils are equal, round, and reactive to light. No scleral icterus.  Neck: Normal range of motion. Neck supple. No thyromegaly present.  Cardiovascular: Normal rate and regular rhythm.  Exam reveals no gallop and no friction rub.   No murmur heard. Pulmonary/Chest: Effort normal and breath sounds normal. No  respiratory distress. She has no wheezes. She has no rales.  Abdominal: Soft. Bowel sounds are normal. She exhibits no distension. There is no tenderness. There is no rebound.    Musculoskeletal: Normal range of motion.  Neurological: She is alert and oriented to person, place, and time.  Skin: Skin is warm and dry. No rash noted.  Psychiatric: She has a normal mood and affect. Her behavior is normal.    ED Course  Procedures (including critical care time) Labs Review Labs Reviewed  COMPREHENSIVE METABOLIC PANEL - Abnormal; Notable for the following:    Glucose, Bld 155 (*)    All other components within normal limits  URINALYSIS, ROUTINE W REFLEX MICROSCOPIC (NOT AT [facility]) - Abnormal; Notable for the following:    Glucose, UA >1000 (*)    Hgb urine dipstick SMALL (*)    Ketones, ur  15 (*)    All other components within normal limits  URINE MICROSCOPIC-ADD ON - Abnormal; Notable for the following:    Squamous Epithelial / LPF 0-5 (*)    All other components within normal limits  CBC WITH DIFFERENTIAL/PLATELET  LIPASE, BLOOD    Imaging Review Ct Abdomen Pelvis W Contrast  09/17/2015  CLINICAL DATA:  Abdominal pain.  Whipple 9 months previous EXAM: CT ABDOMEN AND PELVIS WITH CONTRAST TECHNIQUE: Multidetector CT imaging of the abdomen and pelvis was performed using the standard protocol following bolus administration of intravenous contrast. CONTRAST:  155mL ISOVUE-300 IOPAMIDOL (ISOVUE-300) INJECTION 61% COMPARISON:  CT abdomen pelvis 05/04/2015 FINDINGS: Lower chest: Lung bases clear without infiltrate or effusion. Heart size within normal limits. Hepatobiliary: Postop cholecystectomy. Small amount of gas in the porta hepatis compatible with pneumobilia. Small bowel loops extend up to the porta hepatis compatible with choledocho jejunostomy. No biliary dilatation. Cystic lesion in the left lobe liver measuring 20 x 28 mm unchanged from prior studies. This does not appear to enhance  significantly. No other liver lesion identified. Pancreas: Atrophic changes the pancreas with pancreatic ductal dilatation. 12 mm cyst in the tail of the pancreas is stable. Multiple additional pancreatic cysts are stable. Resection of the pancreatic head cystic mass. No evidence of acute pancreatitis. Spleen: Negative Adrenals/Urinary Tract: No renal mass or obstruction. No urinary tract calculi. Urinary bladder normal. Stomach/Bowel: Prior Whipple procedure. Small bowel loops in the right upper abdomen most consistent with choledochojejunostomy. Negative for bowel obstruction. No bowel edema. Vascular/Lymphatic: Mild atherosclerotic disease in the aorta and iliac arteries without aneurysm. No lymphadenopathy. Reproductive: Hysterectomy.  No pelvic mass Other: Ventral hernia in the midline containing transverse colon. No bowel edema. Hernia has developed since the prior CT. Musculoskeletal: No acute skeletal abnormality. IMPRESSION: Postop Whipple procedure. Small amount of pneumobilia most compatible with choledochal jejunostomy. No biliary dilatation. Negative for bowel obstruction. Chronic atrophy and cystic change in the pancreas stable from prior studies. Interval resection of cyst in the pancreatic head. Stable liver cyst Ventral hernia in the midline epigastrium containing transverse colon. Electronically Signed   By: Franchot Gallo M.D.   On: 09/17/2015 10:57   I have personally reviewed and evaluated these images and lab results as part of my medical decision-making.   EKG Interpretation None      MDM   Final diagnoses:  Epigastric pain    The skin is reassuring. After one dose of medication the patient is feeling well. Taking some by mouth liquids. Symptoms sound consistent with intestinal colic. This may be due to her anastomosis. No sign of obstruction. Otherwise normal findings related to her surgery and no other acute findings noted. Plan will be when necessary Bentyl. Primary care  follow-up. ER with acute changes.    Tanna Furry, MD 09/17/15 [phone]

## 2015-09-17 NOTE — Discharge Instructions (Signed)

## 2015-10-01 ENCOUNTER — Other Ambulatory Visit (HOSPITAL_COMMUNITY): Payer: Self-pay | Admitting: Pulmonary Disease

## 2015-10-01 DIAGNOSIS — Z09 Encounter for follow-up examination after completed treatment for conditions other than malignant neoplasm: Secondary | ICD-10-CM

## 2015-10-02 ENCOUNTER — Other Ambulatory Visit (HOSPITAL_COMMUNITY): Payer: Self-pay | Admitting: Pulmonary Disease

## 2015-10-02 DIAGNOSIS — Z9889 Other specified postprocedural states: Secondary | ICD-10-CM

## 2015-10-09 ENCOUNTER — Ambulatory Visit (HOSPITAL_COMMUNITY)
Admission: RE | Admit: 2015-10-09 | Discharge: 2015-10-09 | Disposition: A | Discharge status: Home / Self Care | Payer: Medicare Other | Source: Ambulatory Visit | Attending: Pulmonary Disease | Admitting: Pulmonary Disease

## 2015-10-09 DIAGNOSIS — Z853 Personal history of malignant neoplasm of breast: Secondary | ICD-10-CM | POA: Diagnosis not present

## 2015-10-09 DIAGNOSIS — Z923 Personal history of irradiation: Secondary | ICD-10-CM | POA: Diagnosis not present

## 2015-10-09 DIAGNOSIS — Z9889 Other specified postprocedural states: Secondary | ICD-10-CM

## 2015-10-09 DIAGNOSIS — R928 Other abnormal and inconclusive findings on diagnostic imaging of breast: Secondary | ICD-10-CM | POA: Diagnosis not present

## 2015-11-08 DIAGNOSIS — E119 Type 2 diabetes mellitus without complications: Secondary | ICD-10-CM | POA: Diagnosis not present

## 2015-11-12 DIAGNOSIS — F419 Anxiety disorder, unspecified: Secondary | ICD-10-CM | POA: Diagnosis not present

## 2015-11-12 DIAGNOSIS — E1165 Type 2 diabetes mellitus with hyperglycemia: Secondary | ICD-10-CM | POA: Diagnosis not present

## 2015-11-12 DIAGNOSIS — J45909 Unspecified asthma, uncomplicated: Secondary | ICD-10-CM | POA: Diagnosis not present

## 2015-11-12 DIAGNOSIS — K8681 Exocrine pancreatic insufficiency: Secondary | ICD-10-CM | POA: Diagnosis not present

## 2015-12-25 ENCOUNTER — Other Ambulatory Visit: Payer: Self-pay

## 2016-02-07 DIAGNOSIS — F419 Anxiety disorder, unspecified: Secondary | ICD-10-CM | POA: Diagnosis not present

## 2016-02-07 DIAGNOSIS — E1165 Type 2 diabetes mellitus with hyperglycemia: Secondary | ICD-10-CM | POA: Diagnosis not present

## 2016-02-07 DIAGNOSIS — J45909 Unspecified asthma, uncomplicated: Secondary | ICD-10-CM | POA: Diagnosis not present

## 2016-02-07 DIAGNOSIS — K8681 Exocrine pancreatic insufficiency: Secondary | ICD-10-CM | POA: Diagnosis not present

## 2016-02-12 DIAGNOSIS — E1165 Type 2 diabetes mellitus with hyperglycemia: Secondary | ICD-10-CM | POA: Diagnosis not present

## 2016-02-12 DIAGNOSIS — Z23 Encounter for immunization: Secondary | ICD-10-CM | POA: Diagnosis not present

## 2016-02-12 DIAGNOSIS — K8681 Exocrine pancreatic insufficiency: Secondary | ICD-10-CM | POA: Diagnosis not present

## 2016-02-12 DIAGNOSIS — J45909 Unspecified asthma, uncomplicated: Secondary | ICD-10-CM | POA: Diagnosis not present

## 2016-02-12 DIAGNOSIS — F419 Anxiety disorder, unspecified: Secondary | ICD-10-CM | POA: Diagnosis not present

## 2016-03-13 DIAGNOSIS — E119 Type 2 diabetes mellitus without complications: Secondary | ICD-10-CM | POA: Diagnosis not present

## 2016-03-13 DIAGNOSIS — Z7951 Long term (current) use of inhaled steroids: Secondary | ICD-10-CM | POA: Diagnosis not present

## 2016-03-13 DIAGNOSIS — Z885 Allergy status to narcotic agent status: Secondary | ICD-10-CM | POA: Diagnosis not present

## 2016-03-13 DIAGNOSIS — K432 Incisional hernia without obstruction or gangrene: Secondary | ICD-10-CM | POA: Diagnosis not present

## 2016-03-13 DIAGNOSIS — Z888 Allergy status to other drugs, medicaments and biological substances status: Secondary | ICD-10-CM | POA: Diagnosis not present

## 2016-03-13 DIAGNOSIS — Z8619 Personal history of other infectious and parasitic diseases: Secondary | ICD-10-CM | POA: Diagnosis not present

## 2016-03-13 DIAGNOSIS — Z90411 Acquired partial absence of pancreas: Secondary | ICD-10-CM | POA: Diagnosis not present

## 2016-03-13 DIAGNOSIS — Z881 Allergy status to other antibiotic agents status: Secondary | ICD-10-CM | POA: Diagnosis not present

## 2016-03-13 DIAGNOSIS — Z9889 Other specified postprocedural states: Secondary | ICD-10-CM | POA: Diagnosis not present

## 2016-03-13 DIAGNOSIS — Z79899 Other long term (current) drug therapy: Secondary | ICD-10-CM | POA: Diagnosis not present

## 2016-03-13 DIAGNOSIS — J45909 Unspecified asthma, uncomplicated: Secondary | ICD-10-CM | POA: Diagnosis not present

## 2016-05-12 DIAGNOSIS — K8681 Exocrine pancreatic insufficiency: Secondary | ICD-10-CM | POA: Diagnosis not present

## 2016-05-12 DIAGNOSIS — D649 Anemia, unspecified: Secondary | ICD-10-CM | POA: Diagnosis not present

## 2016-05-12 DIAGNOSIS — E785 Hyperlipidemia, unspecified: Secondary | ICD-10-CM | POA: Diagnosis not present

## 2016-05-12 DIAGNOSIS — E119 Type 2 diabetes mellitus without complications: Secondary | ICD-10-CM | POA: Diagnosis not present

## 2016-05-12 DIAGNOSIS — J45909 Unspecified asthma, uncomplicated: Secondary | ICD-10-CM | POA: Diagnosis not present

## 2016-05-12 DIAGNOSIS — F419 Anxiety disorder, unspecified: Secondary | ICD-10-CM | POA: Diagnosis not present

## 2016-05-12 DIAGNOSIS — F329 Major depressive disorder, single episode, unspecified: Secondary | ICD-10-CM | POA: Diagnosis not present

## 2016-05-18 ENCOUNTER — Encounter (HOSPITAL_COMMUNITY): Payer: Self-pay | Admitting: *Deleted

## 2016-05-18 ENCOUNTER — Emergency Department (HOSPITAL_COMMUNITY): Payer: Medicare Other

## 2016-05-18 ENCOUNTER — Emergency Department (HOSPITAL_COMMUNITY)
Admission: EM | Admit: 2016-05-18 | Discharge: 2016-05-18 | Disposition: A | Discharge status: Home / Self Care | Payer: Medicare Other | Attending: Emergency Medicine | Admitting: Emergency Medicine

## 2016-05-18 DIAGNOSIS — Z79899 Other long term (current) drug therapy: Secondary | ICD-10-CM | POA: Diagnosis not present

## 2016-05-18 DIAGNOSIS — Z853 Personal history of malignant neoplasm of breast: Secondary | ICD-10-CM | POA: Insufficient documentation

## 2016-05-18 DIAGNOSIS — E119 Type 2 diabetes mellitus without complications: Secondary | ICD-10-CM | POA: Insufficient documentation

## 2016-05-18 DIAGNOSIS — J45909 Unspecified asthma, uncomplicated: Secondary | ICD-10-CM | POA: Insufficient documentation

## 2016-05-18 DIAGNOSIS — K5900 Constipation, unspecified: Secondary | ICD-10-CM | POA: Diagnosis not present

## 2016-05-18 DIAGNOSIS — R109 Unspecified abdominal pain: Secondary | ICD-10-CM | POA: Diagnosis present

## 2016-05-18 LAB — URINALYSIS, ROUTINE W REFLEX MICROSCOPIC
BACTERIA UA: NONE SEEN
Bilirubin Urine: NEGATIVE
Ketones, ur: 5 mg/dL — AB
LEUKOCYTES UA: NEGATIVE
Nitrite: NEGATIVE
PH: 5 (ref 5.0–8.0)
PROTEIN: NEGATIVE mg/dL
Specific Gravity, Urine: 1.021 (ref 1.005–1.030)

## 2016-05-18 LAB — COMPREHENSIVE METABOLIC PANEL
ALBUMIN: 3.8 g/dL (ref 3.5–5.0)
ALK PHOS: 77 U/L (ref 38–126)
ALT: 23 U/L (ref 14–54)
AST: 17 U/L (ref 15–41)
Anion gap: 8 (ref 5–15)
BILIRUBIN TOTAL: 1.2 mg/dL (ref 0.3–1.2)
BUN: 18 mg/dL (ref 6–20)
CALCIUM: 9.4 mg/dL (ref 8.9–10.3)
CO2: 26 mmol/L (ref 22–32)
CREATININE: 0.67 mg/dL (ref 0.44–1.00)
Chloride: 101 mmol/L (ref 101–111)
GFR calc Af Amer: >60 mL/min (ref >60)
GFR calc non Af Amer: >60 mL/min (ref >60)
GLUCOSE: 196 mg/dL — AB (ref 65–99)
Potassium: 3.8 mmol/L (ref 3.5–5.1)
SODIUM: 135 mmol/L (ref 135–145)
TOTAL PROTEIN: 7.4 g/dL (ref 6.5–8.1)

## 2016-05-18 LAB — CBC WITH DIFFERENTIAL/PLATELET
BASOS PCT: 0 %
Basophils Absolute: 0 10*3/uL (ref 0.0–0.1)
EOS ABS: 0.1 10*3/uL (ref 0.0–0.7)
Eosinophils Relative: 1 %
HEMATOCRIT: 38.5 % (ref 36.0–46.0)
HEMOGLOBIN: 13 g/dL (ref 12.0–15.0)
LYMPHS ABS: 2 10*3/uL (ref 0.7–4.0)
Lymphocytes Relative: 29 %
MCH: 30.5 pg (ref 26.0–34.0)
MCHC: 33.8 g/dL (ref 30.0–36.0)
MCV: 90.4 fL (ref 78.0–100.0)
MONOS PCT: 8 %
Monocytes Absolute: 0.6 10*3/uL (ref 0.1–1.0)
NEUTROS ABS: 4.3 10*3/uL (ref 1.7–7.7)
NEUTROS PCT: 62 %
Platelets: 224 10*3/uL (ref 150–400)
RBC: 4.26 MIL/uL (ref 3.87–5.11)
RDW: 12.8 % (ref 11.5–15.5)
WBC: 7 10*3/uL (ref 4.0–10.5)

## 2016-05-18 LAB — LIPASE, BLOOD: Lipase: 10 U/L — ABNORMAL LOW (ref 11–51)

## 2016-05-18 MED ORDER — SODIUM CHLORIDE 0.9 % IV BOLUS (SEPSIS): 1000.0000 mL | Freq: Once | INTRAVENOUS | Status: DC

## 2016-05-18 MED ORDER — FENTANYL CITRATE (PF) 100 MCG/2ML IJ SOLN: 50.0000 ug | Freq: Once | INTRAMUSCULAR | Status: DC

## 2016-05-18 MED ORDER — SIMETHICONE 80 MG PO CHEW
80.0000 mg | CHEWABLE_TABLET | Freq: Once | ORAL | Status: AC
  Administered 2016-05-18: 80 mg via ORAL
  Filled 2016-05-18: qty 1

## 2016-05-18 MED ORDER — ONDANSETRON HCL 4 MG/2ML IJ SOLN: 4.0000 mg | Freq: Once | INTRAMUSCULAR | Status: DC

## 2016-05-18 MED ORDER — ALUM HYDROXIDE-MAG TRISILICATE 80-20 MG PO CHEW
2.0000 | CHEWABLE_TABLET | Freq: Once | ORAL | Status: DC
  Filled 2016-05-18: qty 2

## 2016-05-18 NOTE — ED Triage Notes (Signed)
Pt c/o abd pain that started last night, states " I have episodes like this" pt reports that she had whipple surgery a year ago, denies any n/v,

## 2016-05-18 NOTE — Discharge Instructions (Signed)
Try gaviscon for your gas. For your constipation, get miralax and put one dose or 17 g in 8 ounces of water,  take 1 dose every 30 minutes for 2-3 hours or until you  get good results and then once or twice daily to prevent constipation. Recheck if you get worse.

## 2016-05-18 NOTE — ED Notes (Signed)
In pt's room to start IV and give IV meds that were ordered and pt stated she "didn't want all of that" and that she feels "better than she did when she first came in" and that she just "needed something for the gas". Notified Dr. Tomi Bamberger of this and orders given to hold off on IV and meds and continue with blood work and xray. Communicated this to pt and she agreed to blood work and Insurance account manager.

## 2016-05-18 NOTE — ED Provider Notes (Signed)
[facility] DEPT Provider Note   CSN: [id number] Arrival date & time: 05/18/16  0148  Time seen 02:13 AM   History   Chief Complaint Chief Complaint  Patient presents with  . Abdominal Pain    HPI Betty Nguyen is a 76 y.o. female.  HPI patient reports she had a Whipple procedure done at [facility] in September 2016. Since then she gets intermittent episodes of abdominal pain that she states feels like she has gas building up. She relates after the surgery she did develop a ventral hernia which has been evaluated at [facility]. She states she gets these episodes every few months. She's never had to be admitted for. She states she had a CT scan her last visit which just showed the ventral hernia. She states tonight about 9 PM she started getting "belly busting pain" that she states is sharp and constant. She states she has little bit of bloating. She denies nausea, vomiting, diarrhea, or constipation. She states she had 3 bowel movements today which is normal. She states normal loose she has horrible loud and horrible smelling flatus. However this evening she is not able to pass any gas. She states she took Mylanta and Tums at home without relief. She states bending over makes the pain worse, she used a heating pad at home for a couple hours which did help.  PCP Alonza Bogus, MD GI Dr Ardis Hughs  Past Medical History:  Diagnosis Date  . Anxiety   . Anxiety and depression   . Asthma   . Breast carcinoma ([place]) 08/2009   Invasive ductal; left; lumpectomy and sentinel node excision- oncology visits yearly.  . Chest pain   . Depression   . Diabetes mellitus type II, controlled ([place])    diet controlled,exercise, no meds  . Diverticulitis 2012   Treated medically  . Gastroesophageal reflux   . Hyperlipidemia   . Nephrolithiasis     Patient Active Problem List   Diagnosis Date Noted  . Muscle weakness 11/01/2012  . Calf pain 10/02/2012  . Asthma 07/12/2012  .  Gastroesophageal reflux   . Nephrolithiasis   . Diverticulitis   . Chest pain   . Breast carcinoma ([place]) 08/26/2009    Past Surgical History:  Procedure Laterality Date  . BREAST LUMPECTOMY     Left  . CESAREAN SECTION    . CHOLECYSTECTOMY    . COLONOSCOPY  2004  . DILATION AND CURETTAGE OF UTERUS    . EUS N/A 09/07/2014   Procedure: UPPER ENDOSCOPIC ULTRASOUND (EUS) LINEAR;  Surgeon: Milus Banister, MD;  Location: [facility];  Service: Endoscopy;  Laterality: N/A;  . NEPHROLITHOTOMY    . TUBAL LIGATION    . WHIPPLE PROCEDURE      OB History    Gravida Para Term Preterm AB Living   2 2 2     2    SAB TAB Ectopic Multiple Live Births                   Home Medications    Prior to Admission medications   Medication Sig Start Date End Date Taking? Authorizing Provider  ALPRAZolam Duanne Moron) 0.25 MG tablet Take 0.25 mg by mouth at bedtime as needed for anxiety.   Yes Historical Provider, MD  Empagliflozin-Metformin HCl ER (SYNJARDY XR) 01-999 MG TB24 Take 1 tablet by mouth.   Yes Historical Provider, MD  fluticasone furoate-vilanterol (BREO ELLIPTA) 200-25 MCG/INH AEPB Inhale 1 puff into the lungs daily.   Yes  Historical Provider, MD  omeprazole (PRILOSEC) 20 MG capsule Take 1 capsule (20 mg total) by mouth daily. 05/04/14  Yes Davonna Belling, MD  simethicone (MYLICON) 0000000 MG chewable tablet Chew 250 mg by mouth every 6 (six) hours as needed for flatulence.   Yes Historical Provider, MD  valACYclovir (VALTREX) 500 MG tablet Take 500 mg by mouth daily as needed (outbreak).    Yes Historical Provider, MD  albuterol (PROVENTIL HFA;VENTOLIN HFA) 108 (90 BASE) MCG/ACT inhaler Inhale 2 puffs into the lungs every 6 (six) hours as needed for wheezing.    Historical Provider, MD  canagliflozin (INVOKANA) 300 MG TABS tablet Take 1 tablet by mouth daily.    Historical Provider, MD  ciclesonide (ALVESCO) 160 MCG/ACT inhaler Inhale 1 puff into the lungs 2 (two) times daily.    Historical  Provider, MD  dicyclomine (BENTYL) 20 MG tablet Take 1 tablet (20 mg total) by mouth 2 (two) times daily as needed (abdominal cramping). 09/17/15   Tanna Furry, MD  fexofenadine (ALLEGRA) 180 MG tablet Take 180 mg by mouth daily as needed for allergies or rhinitis.    Historical Provider, MD  HYDROcodone-acetaminophen (NORCO/VICODIN) 5-325 MG tablet Take one every 6-8 hours for pain not helped by tylenol alone 05/04/15   Milton Ferguson, MD  OVER THE COUNTER MEDICATION Take 2 tablets by mouth daily. Probiotic gummies    Historical Provider, MD  potassium chloride SA (K-DUR,KLOR-CON) 20 MEQ tablet Take 1 tablet (20 mEq total) by mouth daily. 05/04/15   Milton Ferguson, MD  sucralfate (CARAFATE) 1 GM/10ML suspension Take 1 g by mouth 4 (four) times daily.    Historical Provider, MD    Family History Family History  Problem Relation Age of Onset  . Coronary artery disease Mother   . Stroke Father   . Diabetes Mellitus II Father   . Colon cancer Neg Hx     Social History Social History  Substance Use Topics  . Smoking status: Never Smoker  . Smokeless tobacco: Never Used  . Alcohol use No  lives at home  Lives alone   Allergies   Biaxin [clarithromycin]; Codeine; Metronidazole; Cephalosporins; and Keflex [cephalexin]   Review of Systems Review of Systems  All other systems reviewed and are negative.    Physical Exam Updated Vital Signs BP 141/72   Pulse 90   Temp 98.3 F (36.8 C) (Oral)   Resp 20   Ht 5' 1.25" (1.556 m)   Wt 124 lb (56.2 kg)   SpO2 98%   BMI 23.24 kg/m   Vital signs normal    Physical Exam  Constitutional: She is oriented to person, place, and time. She appears well-developed and well-nourished.  Non-toxic appearance. She does not appear ill. No distress.  HENT:  Head: Normocephalic and atraumatic.  Right Ear: External ear normal.  Left Ear: External ear normal.  Nose: Nose normal. No mucosal edema or rhinorrhea.  Mouth/Throat: Oropharynx is clear and  moist and mucous membranes are normal. No dental abscesses or uvula swelling.  Eyes: Conjunctivae and EOM are normal. Pupils are equal, round, and reactive to light.  Neck: Normal range of motion and full passive range of motion without pain. Neck supple.  Cardiovascular: Normal rate, regular rhythm and normal heart sounds.  Exam reveals no gallop and no friction rub.   No murmur heard. Pulmonary/Chest: Effort normal and breath sounds normal. No respiratory distress. She has no wheezes. She has no rhonchi. She has no rales. She exhibits no tenderness and no  crepitus.  Abdominal: Soft. Normal appearance and bowel sounds are normal. She exhibits distension. There is no tenderness. There is no rebound and no guarding.    Patient is tender diffusely along her lower abdomen. Her ventral hernia is palpated and is in her epigastric area.  Musculoskeletal: Normal range of motion. She exhibits no edema or tenderness.  Moves all extremities well.   Neurological: She is alert and oriented to person, place, and time. She has normal strength. No cranial nerve deficit.  Skin: Skin is warm, dry and intact. No rash noted. No erythema. No pallor.  Psychiatric: She has a normal mood and affect. Her speech is normal and behavior is normal. Her mood appears not anxious.  Nursing note and vitals reviewed.    ED Treatments / Results  Labs (all labs ordered are listed, but only abnormal results are displayed) Results for orders placed or performed during the hospital encounter of 05/18/16  CBC with Differential  Result Value Ref Range   WBC 7.0 4.0 - 10.5 K/uL   RBC 4.26 3.87 - 5.11 MIL/uL   Hemoglobin 13.0 12.0 - 15.0 g/dL   HCT 38.5 36.0 - 46.0 %   MCV 90.4 78.0 - 100.0 fL   MCH 30.5 26.0 - 34.0 pg   MCHC 33.8 30.0 - 36.0 g/dL   RDW 12.8 11.5 - 15.5 %   Platelets 224 150 - 400 K/uL   Neutrophils Relative % 62 %   Neutro Abs 4.3 1.7 - 7.7 K/uL   Lymphocytes Relative 29 %   Lymphs Abs 2.0 0.7 - 4.0  K/uL   Monocytes Relative 8 %   Monocytes Absolute 0.6 0.1 - 1.0 K/uL   Eosinophils Relative 1 %   Eosinophils Absolute 0.1 0.0 - 0.7 K/uL   Basophils Relative 0 %   Basophils Absolute 0.0 0.0 - 0.1 K/uL  Comprehensive metabolic panel  Result Value Ref Range   Sodium 135 135 - 145 mmol/L   Potassium 3.8 3.5 - 5.1 mmol/L   Chloride 101 101 - 111 mmol/L   CO2 26 22 - 32 mmol/L   Glucose, Bld 196 (H) 65 - 99 mg/dL   BUN 18 6 - 20 mg/dL   Creatinine, Ser 0.67 0.44 - 1.00 mg/dL   Calcium 9.4 8.9 - 10.3 mg/dL   Total Protein 7.4 6.5 - 8.1 g/dL   Albumin 3.8 3.5 - 5.0 g/dL   AST 17 15 - 41 U/L   ALT 23 14 - 54 U/L   Alkaline Phosphatase 77 38 - 126 U/L   Total Bilirubin 1.2 0.3 - 1.2 mg/dL   GFR calc non Af Amer >60 >60 mL/min   GFR calc Af Amer >60 >60 mL/min   Anion gap 8 5 - 15  Urinalysis, Routine w reflex microscopic  Result Value Ref Range   Color, Urine YELLOW YELLOW   APPearance CLEAR CLEAR   Specific Gravity, Urine 1.021 1.005 - 1.030   pH 5.0 5.0 - 8.0   Glucose, UA >=500 (A) NEGATIVE mg/dL   Hgb urine dipstick SMALL (A) NEGATIVE   Bilirubin Urine NEGATIVE NEGATIVE   Ketones, ur 5 (A) NEGATIVE mg/dL   Protein, ur NEGATIVE NEGATIVE mg/dL   Nitrite NEGATIVE NEGATIVE   Leukocytes, UA NEGATIVE NEGATIVE   RBC / HPF 0-5 0 - 5 RBC/hpf   WBC, UA 0-5 0 - 5 WBC/hpf   Bacteria, UA NONE SEEN NONE SEEN  Lipase, blood  Result Value Ref Range   Lipase 10 (L) 11 -  51 U/L   Laboratory interpretation all normal except hyperglycemia, glucosuria    EKG  EKG Interpretation None       Radiology Dg Abd 2 Views  Result Date: 05/18/2016 CLINICAL DATA:  Abdominal pain EXAM: ABDOMEN - 2 VIEW COMPARISON:  09/17/2015 FINDINGS: Lung bases are clear. No free air beneath the diaphragm. Nonobstructed bowel-gas pattern. Large amount of stool in the colon. No pathologic calcifications. Calcified pelvic phleboliths. IMPRESSION: Nonobstructed bowel-gas pattern with large amount of stool  Electronically Signed   By: Donavan Foil M.D.   On: 05/18/2016 03:51    Procedures Procedures (including critical care time)  Medications Ordered in ED Medications  sodium chloride 0.9 % bolus 1,000 mL (1,000 mLs Intravenous Not Given 05/18/16 0439)  fentaNYL (SUBLIMAZE) injection 50 mcg (50 mcg Intravenous Not Given 05/18/16 0440)  ondansetron (ZOFRAN) injection 4 mg (4 mg Intravenous Not Given 05/18/16 0440)  alum hydroxide-mag trisilicate (GAVISCON) [id number] MG per chewable tablet 2 tablet (2 tablets Oral Not Given 05/18/16 0507)  simethicone (MYLICON) chewable tablet 80 mg (80 mg Oral Given 05/18/16 0509)     Initial Impression / Assessment and Plan / ED Course  I have reviewed the triage vital signs and the nursing notes.  Pertinent labs & imaging results that were available during my care of the patient were reviewed by me and considered in my medical decision making (see chart for details).   IV fluids and IV fentanyl and Zofran was ordered for the patient which she refused. Laboratory testing and x-rays were ordered.  Recheck 5 AM. Patient states she still hurting, I reminded her she refused the pain medicine. She was given Mylicon for her gas. We went over her x-ray and she does indeed have a lot of stool throughout her right colon and the rest of her colon. She was instructed to take Gaviscon over-the-counter for her symptoms to gas. She can use MiraLAX for her constipation. I suspect her pain may be from constipation. If she feels worse she can be re-evaluated.    Final Clinical Impressions(s) / ED Diagnoses   Final diagnoses:  Constipation, unspecified constipation type    New Prescriptions OTC miralax and gaviscon  Plan discharge  Rolland Porter, MD, Barbette Or, MD 05/18/16 [phone]

## 2016-06-05 DIAGNOSIS — E1165 Type 2 diabetes mellitus with hyperglycemia: Secondary | ICD-10-CM | POA: Diagnosis not present

## 2016-06-05 DIAGNOSIS — K8681 Exocrine pancreatic insufficiency: Secondary | ICD-10-CM | POA: Diagnosis not present

## 2016-06-05 DIAGNOSIS — J45909 Unspecified asthma, uncomplicated: Secondary | ICD-10-CM | POA: Diagnosis not present

## 2016-06-05 DIAGNOSIS — K432 Incisional hernia without obstruction or gangrene: Secondary | ICD-10-CM | POA: Diagnosis not present

## 2016-09-02 DIAGNOSIS — K8681 Exocrine pancreatic insufficiency: Secondary | ICD-10-CM | POA: Diagnosis not present

## 2016-09-02 DIAGNOSIS — D649 Anemia, unspecified: Secondary | ICD-10-CM | POA: Diagnosis not present

## 2016-09-02 DIAGNOSIS — E119 Type 2 diabetes mellitus without complications: Secondary | ICD-10-CM | POA: Diagnosis not present

## 2016-09-02 LAB — HEMOGLOBIN A1C: HEMOGLOBIN A1C: 8.1

## 2016-09-04 DIAGNOSIS — J45909 Unspecified asthma, uncomplicated: Secondary | ICD-10-CM | POA: Diagnosis not present

## 2016-09-04 DIAGNOSIS — E1165 Type 2 diabetes mellitus with hyperglycemia: Secondary | ICD-10-CM | POA: Diagnosis not present

## 2016-09-04 DIAGNOSIS — K8681 Exocrine pancreatic insufficiency: Secondary | ICD-10-CM | POA: Diagnosis not present

## 2016-09-04 DIAGNOSIS — I10 Essential (primary) hypertension: Secondary | ICD-10-CM | POA: Diagnosis not present

## 2016-09-16 DIAGNOSIS — W57XXXA Bitten or stung by nonvenomous insect and other nonvenomous arthropods, initial encounter: Secondary | ICD-10-CM | POA: Diagnosis not present

## 2016-09-16 DIAGNOSIS — F419 Anxiety disorder, unspecified: Secondary | ICD-10-CM | POA: Diagnosis not present

## 2016-09-16 DIAGNOSIS — E1165 Type 2 diabetes mellitus with hyperglycemia: Secondary | ICD-10-CM | POA: Diagnosis not present

## 2016-09-29 ENCOUNTER — Other Ambulatory Visit (HOSPITAL_COMMUNITY): Payer: Self-pay | Admitting: Pulmonary Disease

## 2016-09-29 ENCOUNTER — Ambulatory Visit (HOSPITAL_COMMUNITY)
Admission: RE | Admit: 2016-09-29 | Discharge: 2016-09-29 | Disposition: A | Discharge status: Home / Self Care | Payer: Medicare Other | Source: Ambulatory Visit | Attending: Pulmonary Disease | Admitting: Pulmonary Disease

## 2016-09-29 DIAGNOSIS — R1030 Lower abdominal pain, unspecified: Secondary | ICD-10-CM | POA: Insufficient documentation

## 2016-09-29 DIAGNOSIS — K7689 Other specified diseases of liver: Secondary | ICD-10-CM | POA: Diagnosis not present

## 2016-09-29 DIAGNOSIS — I700 Atherosclerosis of aorta: Secondary | ICD-10-CM | POA: Diagnosis not present

## 2016-09-29 DIAGNOSIS — K439 Ventral hernia without obstruction or gangrene: Secondary | ICD-10-CM | POA: Insufficient documentation

## 2016-09-29 DIAGNOSIS — F419 Anxiety disorder, unspecified: Secondary | ICD-10-CM | POA: Diagnosis not present

## 2016-09-29 DIAGNOSIS — K8681 Exocrine pancreatic insufficiency: Secondary | ICD-10-CM | POA: Diagnosis not present

## 2016-09-29 DIAGNOSIS — Z931 Gastrostomy status: Secondary | ICD-10-CM | POA: Diagnosis not present

## 2016-09-29 DIAGNOSIS — J45909 Unspecified asthma, uncomplicated: Secondary | ICD-10-CM | POA: Diagnosis not present

## 2016-09-29 DIAGNOSIS — R109 Unspecified abdominal pain: Secondary | ICD-10-CM | POA: Diagnosis not present

## 2016-09-29 DIAGNOSIS — K862 Cyst of pancreas: Secondary | ICD-10-CM | POA: Diagnosis not present

## 2016-09-29 DIAGNOSIS — E119 Type 2 diabetes mellitus without complications: Secondary | ICD-10-CM | POA: Diagnosis not present

## 2016-09-29 LAB — POCT I-STAT CREATININE: Creatinine, Ser: 0.6 mg/dL (ref 0.44–1.00)

## 2016-09-29 MED ORDER — IOPAMIDOL (ISOVUE-300) INJECTION 61%
100.0000 mL | Freq: Once | INTRAVENOUS | Status: AC | PRN
  Administered 2016-09-29: 100 mL via INTRAVENOUS

## 2016-09-29 MED ORDER — IOPAMIDOL (ISOVUE-300) INJECTION 61%
INTRAVENOUS | Status: AC
  Filled 2016-09-29: qty 30

## 2016-10-07 ENCOUNTER — Ambulatory Visit (INDEPENDENT_AMBULATORY_CARE_PROVIDER_SITE_OTHER): Payer: Medicare Other | Admitting: Physician Assistant

## 2016-10-07 ENCOUNTER — Encounter: Payer: Self-pay | Admitting: Physician Assistant

## 2016-10-07 VITALS — BP 130/70 | HR 82 | Ht 59.5 in | Wt 121.0 lb

## 2016-10-07 DIAGNOSIS — Z1211 Encounter for screening for malignant neoplasm of colon: Secondary | ICD-10-CM | POA: Diagnosis not present

## 2016-10-07 DIAGNOSIS — R143 Flatulence: Secondary | ICD-10-CM | POA: Diagnosis not present

## 2016-10-07 DIAGNOSIS — G8929 Other chronic pain: Secondary | ICD-10-CM

## 2016-10-07 DIAGNOSIS — R1031 Right lower quadrant pain: Secondary | ICD-10-CM

## 2016-10-07 DIAGNOSIS — R1032 Left lower quadrant pain: Secondary | ICD-10-CM

## 2016-10-07 DIAGNOSIS — R935 Abnormal findings on diagnostic imaging of other abdominal regions, including retroperitoneum: Secondary | ICD-10-CM

## 2016-10-07 DIAGNOSIS — Z86010 Personal history of colonic polyps: Secondary | ICD-10-CM

## 2016-10-07 MED ORDER — NA SULFATE-K SULFATE-MG SULF 17.5-3.13-1.6 GM/177ML PO SOLN: 1.0000 | Freq: Once | ORAL | 0 refills | Status: AC

## 2016-10-07 MED ORDER — DICYCLOMINE HCL 10 MG PO CAPS: ORAL_CAPSULE | ORAL | 3 refills | Status: DC

## 2016-10-07 NOTE — Progress Notes (Signed)
I agree with the above note, plan 

## 2016-10-07 NOTE — Patient Instructions (Addendum)
Try over the counter Recticare for rectal soreness.  You can get this at [facility], possible at your pharmacy.  Take GasX as needed.  We have provided you with a Low Gas Diet brochure.   You may have a light breakfast the morning of prep day 10-13-2016 (the day before the procedure).  You may choose from one of the following items: eggs and toast OR chicken noodle soup and crackers.  You should have your breakfast completed between 8:00 and 9:00 am the day before your procedure.   After you have had your light breakfast you should start a clear liquid diet only, NO SOLIDS. No additional solid food is allowed. You may continue to have clear liquid up to 3 hours prior to your procedure.   You have been scheduled for a colonoscopy. Please follow written instructions given to you at your visit today.  Please pick up your prep supplies at the pharmacy within the next 1-3 days. If you use inhalers (even only as needed), please bring them with you on the day of your procedure. Your physician has requested that you go to www.startemmi.com and enter the access code given to you at your visit today. This web site gives a general overview about your procedure. However, you should still follow specific instructions given to you by our office regarding your preparation for the procedure.

## 2016-10-07 NOTE — Progress Notes (Signed)
Subjective:    Patient ID: Betty Nguyen, female    DOB: Sep 14, 1940, 76 y.o.   MRN: [id number]  HPI Betty Nguyen is a very nice 76 year old white female known to Dr. Ardis Hughs who is referred back today by Dr. Sinda Du for evaluation of abnormal CT scan with concern for rectal thickening. Patient has history of GERD, nephrolithiasis, diverticulosis, breast cancer, and had a Whipple procedure done by Dr. 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 Patient has had complaints recently of lower abdominal discomfort and what she describes as very painful gas. She says so many things that she eats causes her to have lower abdominal pain and gas. She has been constipated off and on and actually had interim episode of fecal impaction last week which she managed on her own. Patient also had a recent tick bite was given a course of doxycycline and Carafate. She says the Carafate was very constipating and she had to  Stop both medications.  CT of the abdomen and pelvis was done on 09/29/2016 with contrast and showed a 3.3 x 2.2 cm cyst in the lateral segment left lobe of the liver, gallbladder surgically absent, prior resection of the pancreatic head with atrophy of the pancreatic body and tail and diffuse pancreatic ductal dilation, small cystic lesion at the pancreatic tail identified 12 x 10 mm stable. There was gas and stool in the distended rectum question rectal wall thickening. She also has a segment of mid transverse colon and a single small bowel loop extending into a supraumbilical ventral hernia without evidence of bowel obstruction. Some wall thickening of a jejunal loop immediately adjacent to the gastrojejunostomy question gastric wall thickening versus artifact from under distention. Colonoscopy was done in 2014 per Dr. Sharlett Iles which showed moderate diverticulosis in the left and sigmoid colon she had a sessile polyp in the rectum 3-5 mm which was removed and  found to be a tubular adenoma.    Review of Systems Pertinent positive and negative review of systems were noted in the above HPI section.  All other review of systems was otherwise negative.  Outpatient Encounter Prescriptions as of 10/07/2016  Medication Sig  . albuterol (PROVENTIL HFA;VENTOLIN HFA) 108 (90 BASE) MCG/ACT inhaler Inhale 2 puffs into the lungs every 6 (six) hours as needed for wheezing.  Marland Kitchen ALPRAZolam (XANAX) 0.25 MG tablet Take 0.25 mg by mouth at bedtime as needed for anxiety.  . Empagliflozin-Metformin HCl ER (SYNJARDY XR) 01-999 MG TB24 Take 1 tablet by mouth.  . fluticasone furoate-vilanterol (BREO ELLIPTA) 200-25 MCG/INH AEPB Inhale 1 puff into the lungs daily.  Marland Kitchen glycerin adult 2 g suppository Place 1 suppository rectally as needed for constipation.  Marland Kitchen omeprazole (PRILOSEC) 20 MG capsule Take 1 capsule (20 mg total) by mouth daily.  . simethicone (MYLICON) 335 MG chewable tablet Chew 125 mg by mouth every 6 (six) hours as needed for flatulence.  . valACYclovir (VALTREX) 500 MG tablet Take 500 mg by mouth daily as needed (outbreak).   Marland Kitchen dicyclomine (BENTYL) 10 MG capsule Take 1 tablet once or twice daily as needed for abdominal pain and gas.  . Na Sulfate-K Sulfate-Mg Sulf 17.5-3.13-1.6 GM/180ML SOLN Take 1 kit by mouth once.  . [DISCONTINUED] canagliflozin (INVOKANA) 300 MG TABS tablet Take 1 tablet by mouth daily.  . [DISCONTINUED] ciclesonide (ALVESCO) 160 MCG/ACT inhaler Inhale 1 puff into the lungs 2 (two) times daily.  . [DISCONTINUED] dicyclomine (BENTYL) 20 MG tablet Take  1 tablet (20 mg total) by mouth 2 (two) times daily as needed (abdominal cramping).  . [DISCONTINUED] fexofenadine (ALLEGRA) 180 MG tablet Take 180 mg by mouth daily as needed for allergies or rhinitis.  . [DISCONTINUED] HYDROcodone-acetaminophen (NORCO/VICODIN) 5-325 MG tablet Take one every 6-8 hours for pain not helped by tylenol alone  . [DISCONTINUED] OVER THE COUNTER MEDICATION Take 2  tablets by mouth daily. Probiotic gummies  . [DISCONTINUED] potassium chloride SA (K-DUR,KLOR-CON) 20 MEQ tablet Take 1 tablet (20 mEq total) by mouth daily.  . [DISCONTINUED] simethicone (MYLICON) 751 MG chewable tablet Chew 250 mg by mouth every 6 (six) hours as needed for flatulence.  . [DISCONTINUED] sucralfate (CARAFATE) 1 GM/10ML suspension Take 1 g by mouth 4 (four) times daily.   No facility-administered encounter medications on file as of 10/07/2016.    Allergies  Allergen Reactions  . Biaxin [Clarithromycin] Other (See Comments)    Hallucinations  . Carafate [Sucralfate] Other (See Comments)    Flared up her vertigo, run her blood sugar up and caused constipation  . Codeine Other (See Comments)    Hallucinations   . Doxycycline Other (See Comments)    GI upset , turned stool orange  . Metronidazole Other (See Comments)    Heart Palpations -flagyl  . Cephalosporins Hives  . Keflex [Cephalexin] Palpitations   Patient Active Problem List   Diagnosis Date Noted  . Muscle weakness 11/01/2012  . Calf pain 10/02/2012  . Asthma 07/12/2012  . Gastroesophageal reflux   . Nephrolithiasis   . Diverticulitis   . Chest pain   . Breast carcinoma ([place]) 08/26/2009   Social History   Social History  . Marital status: Divorced    Spouse name: N/A  . Number of children: 2  . Years of education: N/A   Occupational History  . dancer for fun    Social History Main Topics  . Smoking status: Never Smoker  . Smokeless tobacco: Never Used  . Alcohol use No  . Drug use: No  . Sexual activity: Not on file   Other Topics Concern  . Not on file   Social History Narrative  . No narrative on file    Betty Nguyen's family history includes Coronary artery disease in her mother; Diabetes Mellitus II in her father; Stroke in her father.      Objective:    Vitals:   10/07/16 1122  BP: 130/70  Pulse: 82    Physical Exam well-developed elderly white female in no acute distress,  accompanied by friend. Patient is anxious and talkative very pleasant blood pressure 130/70 pulse 82,, height 4 foot 11, weight 121. BMI 24.0. HEENT; nontraumatic normocephalic EOMI PERRLA sclera anicteric, Cardiovascular; regular rate and rhythm with S1-S2, Pulmonary; clear bilaterally, Abdomen; soft, midline incisional scars no palpable mass or hepatosplenomegaly she does have a fairly large supraumbilical ventral hernia which is soft, reducible and nontender, she has mild tenderness bilaterally in the lower quadrants, bowel sounds present, Rectal; exam no external lesions noted she's somewhat tender with digital exam no fissure or lesion appreciated, Extremities; no clubbing cyanosis or edema skin warm and dry, Neuropsych ;mood and affect appropriate       Assessment & Plan:   #6 76 year old white female with several week history of lower abdominal pain, gas and now with abnormal CT scan concerning for rectal thickening. Patient has history of rectal tubular adenoma 2014-will rule out occult rectal lesion.  Regarding her lower abdominal pain this seems to be exacerbated by by  mouth intake of any high gas producing foods which she is eating on a regular basis.  She also does have a supraumbilical ventral hernia containing a loop of transverse colon. She does not however complain of any pain in the area of the hernia.  #2 status post pancreaticoduodenectomy 2016/Whipple procedure per Dr. Eugenia Pancoast for mucinous cystic neoplasm of the pancreas. Which resection of the right lobe of the liver and cholecystectomy done as well. #3 history of GERD #4 breast cancer #5 diverticulosis #6 recent tick bite-with GI intolerance to doxycycline and Carafate #7 history of rectal tubular adenoma as outlined above 2014  Plan; patient will be scheduled for colonoscopy with Dr. Ardis Hughs. Procedure discussed in detail with patient including risks and benefits and she is agreeable to proceed. Patient was given a copy of  a low gas diet and we specifically discussed decreasing consumption of high gas producing foods i.e. beans and legumes She may take Gas-X before meals. Have also given her a prescription for Bentyl 10 mg to use on a when necessary basis for episodes of lower abdominal pain.   Calyx Hawker Genia Harold PA-C 10/07/2016   Cc: Sinda Du, MD

## 2016-10-13 ENCOUNTER — Telehealth: Payer: Self-pay | Admitting: Gastroenterology

## 2016-10-13 NOTE — Telephone Encounter (Signed)
yes

## 2016-10-14 ENCOUNTER — Encounter: Payer: Medicare Other | Admitting: Gastroenterology

## 2016-10-15 NOTE — Telephone Encounter (Signed)
PT BILLED. °

## 2016-10-27 DIAGNOSIS — E1165 Type 2 diabetes mellitus with hyperglycemia: Secondary | ICD-10-CM | POA: Diagnosis not present

## 2016-10-27 DIAGNOSIS — F419 Anxiety disorder, unspecified: Secondary | ICD-10-CM | POA: Diagnosis not present

## 2016-10-27 DIAGNOSIS — B880 Other acariasis: Secondary | ICD-10-CM | POA: Diagnosis not present

## 2016-10-27 DIAGNOSIS — K8681 Exocrine pancreatic insufficiency: Secondary | ICD-10-CM | POA: Diagnosis not present

## 2016-12-08 DIAGNOSIS — F419 Anxiety disorder, unspecified: Secondary | ICD-10-CM | POA: Diagnosis not present

## 2016-12-08 DIAGNOSIS — J449 Chronic obstructive pulmonary disease, unspecified: Secondary | ICD-10-CM | POA: Diagnosis not present

## 2016-12-08 DIAGNOSIS — E1165 Type 2 diabetes mellitus with hyperglycemia: Secondary | ICD-10-CM | POA: Diagnosis not present

## 2016-12-08 DIAGNOSIS — K8681 Exocrine pancreatic insufficiency: Secondary | ICD-10-CM | POA: Diagnosis not present

## 2016-12-09 DIAGNOSIS — F419 Anxiety disorder, unspecified: Secondary | ICD-10-CM | POA: Diagnosis not present

## 2016-12-09 DIAGNOSIS — R634 Abnormal weight loss: Secondary | ICD-10-CM | POA: Diagnosis not present

## 2016-12-09 DIAGNOSIS — J449 Chronic obstructive pulmonary disease, unspecified: Secondary | ICD-10-CM | POA: Diagnosis not present

## 2016-12-09 DIAGNOSIS — K8681 Exocrine pancreatic insufficiency: Secondary | ICD-10-CM | POA: Diagnosis not present

## 2016-12-09 DIAGNOSIS — E1165 Type 2 diabetes mellitus with hyperglycemia: Secondary | ICD-10-CM | POA: Diagnosis not present

## 2017-01-02 ENCOUNTER — Other Ambulatory Visit (HOSPITAL_COMMUNITY): Payer: Self-pay | Admitting: Pulmonary Disease

## 2017-01-02 DIAGNOSIS — Z9889 Other specified postprocedural states: Secondary | ICD-10-CM

## 2017-01-06 ENCOUNTER — Ambulatory Visit (HOSPITAL_COMMUNITY)
Admission: RE | Admit: 2017-01-06 | Discharge: 2017-01-06 | Disposition: A | Discharge status: Home / Self Care | Payer: Medicare Other | Source: Ambulatory Visit | Attending: Pulmonary Disease | Admitting: Pulmonary Disease

## 2017-01-06 DIAGNOSIS — Z08 Encounter for follow-up examination after completed treatment for malignant neoplasm: Secondary | ICD-10-CM | POA: Insufficient documentation

## 2017-01-06 DIAGNOSIS — R928 Other abnormal and inconclusive findings on diagnostic imaging of breast: Secondary | ICD-10-CM | POA: Diagnosis not present

## 2017-01-06 DIAGNOSIS — Z9889 Other specified postprocedural states: Secondary | ICD-10-CM

## 2017-01-06 DIAGNOSIS — Z853 Personal history of malignant neoplasm of breast: Secondary | ICD-10-CM | POA: Insufficient documentation

## 2017-01-06 LAB — HM MAMMOGRAPHY

## 2017-01-10 ENCOUNTER — Encounter (HOSPITAL_COMMUNITY): Payer: Self-pay | Admitting: Emergency Medicine

## 2017-01-10 ENCOUNTER — Emergency Department (HOSPITAL_COMMUNITY): Payer: Medicare Other

## 2017-01-10 ENCOUNTER — Emergency Department (HOSPITAL_COMMUNITY)
Admission: EM | Admit: 2017-01-10 | Discharge: 2017-01-10 | Disposition: A | Discharge status: Home / Self Care | Payer: Medicare Other | Attending: Emergency Medicine | Admitting: Emergency Medicine

## 2017-01-10 DIAGNOSIS — J45909 Unspecified asthma, uncomplicated: Secondary | ICD-10-CM | POA: Insufficient documentation

## 2017-01-10 DIAGNOSIS — Z79899 Other long term (current) drug therapy: Secondary | ICD-10-CM | POA: Diagnosis not present

## 2017-01-10 DIAGNOSIS — K5900 Constipation, unspecified: Secondary | ICD-10-CM | POA: Insufficient documentation

## 2017-01-10 DIAGNOSIS — E119 Type 2 diabetes mellitus without complications: Secondary | ICD-10-CM | POA: Diagnosis not present

## 2017-01-10 DIAGNOSIS — Z853 Personal history of malignant neoplasm of breast: Secondary | ICD-10-CM | POA: Insufficient documentation

## 2017-01-10 DIAGNOSIS — R1084 Generalized abdominal pain: Secondary | ICD-10-CM | POA: Diagnosis present

## 2017-01-10 LAB — CBC WITH DIFFERENTIAL/PLATELET
BASOS ABS: 0 10*3/uL (ref 0.0–0.1)
Basophils Relative: 0 %
Eosinophils Absolute: 0.1 10*3/uL (ref 0.0–0.7)
Eosinophils Relative: 2 %
HEMATOCRIT: 40.3 % (ref 36.0–46.0)
Hemoglobin: 13.5 g/dL (ref 12.0–15.0)
LYMPHS PCT: 31 %
Lymphs Abs: 2.5 10*3/uL (ref 0.7–4.0)
MCH: 30.7 pg (ref 26.0–34.0)
MCHC: 33.5 g/dL (ref 30.0–36.0)
MCV: 91.6 fL (ref 78.0–100.0)
MONO ABS: 0.6 10*3/uL (ref 0.1–1.0)
Monocytes Relative: 8 %
NEUTROS ABS: 4.7 10*3/uL (ref 1.7–7.7)
NEUTROS PCT: 59 %
Platelets: 197 10*3/uL (ref 150–400)
RBC: 4.4 MIL/uL (ref 3.87–5.11)
RDW: 13.5 % (ref 11.5–15.5)
WBC: 7.9 10*3/uL (ref 4.0–10.5)

## 2017-01-10 LAB — URINALYSIS, ROUTINE W REFLEX MICROSCOPIC
BACTERIA UA: NONE SEEN
Bilirubin Urine: NEGATIVE
Ketones, ur: NEGATIVE mg/dL
Leukocytes, UA: NEGATIVE
Nitrite: NEGATIVE
PROTEIN: NEGATIVE mg/dL
SQUAMOUS EPITHELIAL / LPF: NONE SEEN
Specific Gravity, Urine: 1.014 (ref 1.005–1.030)
pH: 6 (ref 5.0–8.0)

## 2017-01-10 LAB — COMPREHENSIVE METABOLIC PANEL
ALBUMIN: 3.7 g/dL (ref 3.5–5.0)
ALT: 33 U/L (ref 14–54)
AST: 20 U/L (ref 15–41)
Alkaline Phosphatase: 76 U/L (ref 38–126)
Anion gap: 10 (ref 5–15)
BILIRUBIN TOTAL: 1 mg/dL (ref 0.3–1.2)
BUN: 5 mg/dL — AB (ref 6–20)
CO2: 22 mmol/L (ref 22–32)
CREATININE: 0.55 mg/dL (ref 0.44–1.00)
Calcium: 9.1 mg/dL (ref 8.9–10.3)
Chloride: 108 mmol/L (ref 101–111)
GFR calc Af Amer: >60 mL/min (ref >60)
GLUCOSE: 204 mg/dL — AB (ref 65–99)
Potassium: 2.8 mmol/L — ABNORMAL LOW (ref 3.5–5.1)
Sodium: 140 mmol/L (ref 135–145)
TOTAL PROTEIN: 6.9 g/dL (ref 6.5–8.1)

## 2017-01-10 MED ORDER — SODIUM CHLORIDE 0.9 % IV BOLUS (SEPSIS)
500.0000 mL | Freq: Once | INTRAVENOUS | Status: AC
  Administered 2017-01-10: 500 mL via INTRAVENOUS

## 2017-01-10 MED ORDER — HYDROMORPHONE HCL 1 MG/ML IJ SOLN
0.5000 mg | Freq: Once | INTRAMUSCULAR | Status: DC
  Filled 2017-01-10: qty 1

## 2017-01-10 MED ORDER — PSYLLIUM 28 % PO PACK: 1.0000 | PACK | Freq: Two times a day (BID) | ORAL | 0 refills | Status: DC

## 2017-01-10 MED ORDER — ONDANSETRON HCL 4 MG/2ML IJ SOLN
4.0000 mg | Freq: Once | INTRAMUSCULAR | Status: DC
  Filled 2017-01-10: qty 2

## 2017-01-10 MED ORDER — IOPAMIDOL (ISOVUE-300) INJECTION 61%
100.0000 mL | Freq: Once | INTRAVENOUS | Status: AC | PRN
  Administered 2017-01-10: 100 mL via INTRAVENOUS

## 2017-01-10 MED ORDER — IOPAMIDOL (ISOVUE-300) INJECTION 61%
INTRAVENOUS | Status: AC
  Filled 2017-01-10: qty 30

## 2017-01-10 NOTE — ED Provider Notes (Signed)
[facility] DEPT Provider Note   CSN: [id number] Arrival date & time: 01/10/17  1748     History   Chief Complaint Chief Complaint  Patient presents with  . Abdominal Pain    HPI Betty Nguyen is a 76 y.o. female.  Patient complains of constipation and abdominal pain.   The history is provided by the patient.  Abdominal Pain   This is a recurrent problem. The problem occurs constantly. The problem has not changed since onset.The pain is associated with an unknown factor. The pain is located in the generalized abdominal region. The pain is at a severity of 3/10. The pain is moderate. Pertinent negatives include anorexia, diarrhea, frequency, hematuria and headaches.    Past Medical History:  Diagnosis Date  . Anxiety   . Anxiety and depression   . Asthma   . Breast carcinoma ([place]) 08/2009   Invasive ductal; left; lumpectomy and sentinel node excision- oncology visits yearly.  . Chest pain   . Depression   . Diabetes mellitus type II, controlled ([place])    diet controlled,exercise, no meds  . Diverticulitis 2012   Treated medically  . Gastroesophageal reflux   . Hyperlipidemia   . Nephrolithiasis   . Tick bite    [place] Tick, alpha gal positive, makes her allergic to meat    Patient Active Problem List   Diagnosis Date Noted  . Muscle weakness 11/01/2012  . Calf pain 10/02/2012  . Asthma 07/12/2012  . Gastroesophageal reflux   . Nephrolithiasis   . Diverticulitis   . Chest pain   . Breast carcinoma ([place]) 08/26/2009    Past Surgical History:  Procedure Laterality Date  . BREAST LUMPECTOMY     Left  . CESAREAN SECTION    . CHOLECYSTECTOMY    . COLONOSCOPY  2004  . DILATION AND CURETTAGE OF UTERUS    . EUS N/A 09/07/2014   Procedure: UPPER ENDOSCOPIC ULTRASOUND (EUS) LINEAR;  Surgeon: Milus Banister, MD;  Location: [facility] ENDOSCOPY;  Service: Endoscopy;  Laterality: N/A;  . NEPHROLITHOTOMY    . TUBAL LIGATION    . WHIPPLE PROCEDURE      OB History    Gravida Para Term Preterm AB Living   4 2 2     2    SAB TAB Ectopic Multiple Live Births                   Home Medications    Prior to Admission medications   Medication Sig Start Date End Date Taking? Authorizing Provider  albuterol (PROVENTIL HFA;VENTOLIN HFA) 108 (90 BASE) MCG/ACT inhaler Inhale 2 puffs into the lungs every 6 (six) hours as needed for wheezing.   Yes [provider]  dicyclomine (BENTYL) 10 MG capsule Take 1 tablet once or twice daily as needed for abdominal pain and gas. 10/07/16  Yes Esterwood, Amy S, PA-C  docusate sodium (COLACE) 100 MG capsule Take 100 mg by mouth daily.   Yes [provider]  Empagliflozin-Metformin HCl ER (SYNJARDY XR) 01-999 MG TB24 Take 1 tablet by mouth.   Yes [provider]  fluticasone furoate-vilanterol (BREO ELLIPTA) 200-25 MCG/INH AEPB Inhale 1 puff into the lungs daily.   Yes [provider]  glycerin adult 2 g suppository Place 1 suppository rectally as needed for constipation.   Yes [provider]  omeprazole (PRILOSEC) 20 MG capsule Take 1 capsule (20 mg total) by mouth daily. 05/04/14  Yes Davonna Belling, MD  valACYclovir (VALTREX) 500 MG tablet  Take 500 mg by mouth daily as needed (outbreak).    Yes [provider]  psyllium (METAMUCIL SMOOTH TEXTURE) 28 % packet Take 1 packet by mouth 2 (two) times daily. 01/10/17   Milton Ferguson, MD    Family History Family History  Problem Relation Age of Onset  . Coronary artery disease Mother   . Stroke Father   . Diabetes Mellitus II Father   . Colon cancer Neg Hx     Social History Social History  Substance Use Topics  . Smoking status: Never Smoker  . Smokeless tobacco: Never Used  . Alcohol use No     Allergies   Biaxin [clarithromycin]; Carafate [sucralfate]; Codeine; Doxycycline; Metronidazole; and Cephalosporins   Review of Systems Review of Systems  Constitutional: Negative for appetite change and fatigue.    HENT: Negative for congestion, ear discharge and sinus pressure.   Eyes: Negative for discharge.  Respiratory: Negative for cough.   Cardiovascular: Negative for chest pain.  Gastrointestinal: Positive for abdominal pain. Negative for anorexia and diarrhea.  Genitourinary: Negative for frequency and hematuria.  Musculoskeletal: Negative for back pain.  Skin: Negative for rash.  Neurological: Negative for seizures and headaches.  Psychiatric/Behavioral: Negative for hallucinations.     Physical Exam Updated Vital Signs BP 127/61   Pulse 64   Temp 98.4 F (36.9 C) (Oral)   Resp 18   Ht 5' (1.524 m)   Wt 49.9 kg (110 lb)   SpO2 97%   BMI 21.48 kg/m   Physical Exam  Constitutional: She is oriented to person, place, and time. She appears well-developed.  HENT:  Head: Normocephalic.  Eyes: Conjunctivae and EOM are normal. No scleral icterus.  Neck: Neck supple. No thyromegaly present.  Cardiovascular: Normal rate and regular rhythm.  Exam reveals no gallop and no friction rub.   No murmur heard. Pulmonary/Chest: No stridor. She has no wheezes. She has no rales. She exhibits no tenderness.  Abdominal: She exhibits no distension. There is tenderness. There is no rebound.  Musculoskeletal: Normal range of motion. She exhibits no edema.  Lymphadenopathy:    She has no cervical adenopathy.  Neurological: She is oriented to person, place, and time. She exhibits normal muscle tone. Coordination normal.  Skin: No rash noted. No erythema.  Psychiatric: She has a normal mood and affect. Her behavior is normal.     ED Treatments / Results  Labs (all labs ordered are listed, but only abnormal results are displayed) Labs Reviewed  URINALYSIS, ROUTINE W REFLEX MICROSCOPIC - Abnormal; Notable for the following:       Result Value   Color, Urine STRAW (*)    Glucose, UA >=500 (*)    Hgb urine dipstick SMALL (*)    All other components within normal limits  COMPREHENSIVE METABOLIC  PANEL - Abnormal; Notable for the following:    Potassium 2.8 (*)    Glucose, Bld 204 (*)    BUN 5 (*)    All other components within normal limits  CBC WITH DIFFERENTIAL/PLATELET    EKG  EKG Interpretation None       Radiology Ct Abdomen Pelvis W Contrast  Result Date: 01/10/2017 CLINICAL DATA:  Generalized abdominal pain for 3 days. Constipation. EXAM: CT ABDOMEN AND PELVIS WITH CONTRAST TECHNIQUE: Multidetector CT imaging of the abdomen and pelvis was performed using the standard protocol following bolus administration of intravenous contrast. CONTRAST:  161mL ISOVUE-300 IOPAMIDOL (ISOVUE-300) INJECTION 61% COMPARISON:  09/29/2016 FINDINGS: Lower chest: No acute abnormality. Hepatobiliary: Cyst  within left lobe of liver is unchanged measuring 3 cm. Previous cholecystectomy. No biliary dilatation. Pancreas: Pancreatic head resection. The pancreatic parenchyma is atrophic and there is dilatation of the common bile duct measuring up to 7 mm. This is compared with 5 mm previously. Cystic lesion within the tail of pancreas is unchanged measuring 11 mm. Spleen: Normal in size without focal abnormality. Adrenals/Urinary Tract: The adrenal glands appear normal. Unremarkable appearance of both kidneys. Urinary bladder appears normal. Stomach/Bowel: Small hiatal hernia. Status post gastrojejunostomy. No abnormal dilatation of the small bowel loops. No small bowel wall thickening or inflammation identified. There is a moderate to marked stool burden throughout the colon compatible with the clinical history of constipation. No pathologic dilatation of the colon however. Gas and stool is seen throughout the colon up to the level of the rectum. Vascular/Lymphatic: Aortic atherosclerosis. No aneurysm. No adenopathy within the upper abdomen. No pelvic adenopathy. Reproductive: Uterus and bilateral adnexa are unremarkable. Other: No free fluid. There is a ventral abdominal wall hernia which contains a  nonobstructed loop of large bowel as before, image 36 of series 2. This measures approximately 6.7 cm diameter. Musculoskeletal: No acute or significant osseous findings. IMPRESSION: 1. Moderate stool burden identified throughout the colon compatible with the clinical history of constipation. 2. Postoperative change from pancreatic head resection and gastrojejunostomy. No evidence for bowel obstruction. 3.  Aortic Atherosclerosis (ICD10-I70.0). 4. Stable cystic lesion within the tail of pancreas measuring 11 mm. Recommend followup imaging in 24 months with pancreas protocol MRI to ensure stability. Electronically Signed   By: Kerby Moors M.D.   On: 01/10/2017 21:17    Procedures Procedures (including critical care time)  Medications Ordered in ED Medications  ondansetron (ZOFRAN) injection 4 mg (4 mg Intravenous Refused 01/10/17 1905)  HYDROmorphone (DILAUDID) injection 0.5 mg (0.5 mg Intravenous Refused 01/10/17 1905)  iopamidol (ISOVUE-300) 61 % injection (not administered)  sodium chloride 0.9 % bolus 500 mL (0 mLs Intravenous Stopped 01/10/17 2001)  iopamidol (ISOVUE-300) 61 % injection 100 mL (100 mLs Intravenous Contrast Given 01/10/17 2051)     Initial Impression / Assessment and Plan / ED Course  I have reviewed the triage vital signs and the nursing notes.  Pertinent labs & imaging results that were available during my care of the patient were reviewed by me and considered in my medical decision making (see chart for details).     CT scan shows some constipation. Patient will be placed on Metamucil and will use some milk of magnesia will follow-up with her PCP. Also she is hypokalemic and she will be increasing her potassium to 2 pills twice a day  Final Clinical Impressions(s) / ED Diagnoses   Final diagnoses:  Constipation, unspecified constipation type    New Prescriptions New Prescriptions   PSYLLIUM (METAMUCIL SMOOTH TEXTURE) 28 % PACKET    Take 1 packet by mouth 2 (two)  times daily.     Milton Ferguson, MD 01/10/17 2150

## 2017-01-10 NOTE — ED Triage Notes (Signed)
Pt states having generalized ABD pain 3 days ago with constipation and cramping. HX of ABD hernia. VSS

## 2017-01-10 NOTE — Discharge Instructions (Signed)
Increase her potassium pills to take 2 pills twice a day.  Take some milk of magnesia

## 2017-01-10 NOTE — ED Notes (Signed)
ED Provider at bedside. 

## 2017-01-15 ENCOUNTER — Encounter: Payer: Self-pay | Admitting: "Endocrinology

## 2017-01-15 ENCOUNTER — Ambulatory Visit (INDEPENDENT_AMBULATORY_CARE_PROVIDER_SITE_OTHER): Payer: Medicare Other | Admitting: "Endocrinology

## 2017-01-15 VITALS — BP 115/68 | HR 80 | Ht 59.5 in | Wt 117.0 lb

## 2017-01-15 DIAGNOSIS — K8681 Exocrine pancreatic insufficiency: Secondary | ICD-10-CM | POA: Diagnosis not present

## 2017-01-15 DIAGNOSIS — K869 Disease of pancreas, unspecified: Secondary | ICD-10-CM | POA: Diagnosis not present

## 2017-01-15 DIAGNOSIS — E1169 Type 2 diabetes mellitus with other specified complication: Secondary | ICD-10-CM | POA: Insufficient documentation

## 2017-01-15 MED ORDER — PANCRELIPASE (LIP-PROT-AMYL) 24000-76000 UNITS PO CPEP: ORAL_CAPSULE | ORAL | 3 refills | Status: DC

## 2017-01-15 NOTE — Progress Notes (Signed)
Subjective:    Patient ID: Betty Nguyen, female    DOB: [birth year].  she is being seen in consultation for management of currently uncontrolled symptomatic diabetes requested by  Sinda Du, MD.   Past Medical History:  Diagnosis Date  . Anxiety   . Anxiety and depression   . Asthma   . Breast carcinoma ([place]) 08/2009   Invasive ductal; left; lumpectomy and sentinel node excision- oncology visits yearly.  . Chest pain   . Depression   . Diabetes mellitus type II, controlled ([place])    diet controlled,exercise, no meds  . Diverticulitis 2012   Treated medically  . Gastroesophageal reflux   . Hyperlipidemia   . Nephrolithiasis   . Tick bite    [place] Tick, alpha gal positive, makes her allergic to meat   Past Surgical History:  Procedure Laterality Date  . BREAST LUMPECTOMY     Left  . CESAREAN SECTION    . CHOLECYSTECTOMY    . COLONOSCOPY  2004  . DILATION AND CURETTAGE OF UTERUS    . EUS N/A 09/07/2014   Procedure: UPPER ENDOSCOPIC ULTRASOUND (EUS) LINEAR;  Surgeon: Milus Banister, MD;  Location: [facility];  Service: Endoscopy;  Laterality: N/A;  . NEPHROLITHOTOMY    . TUBAL LIGATION    . WHIPPLE PROCEDURE     Social History   Social History  . Marital status: Divorced    Spouse name: N/A  . Number of children: 2  . Years of education: N/A   Occupational History  . dancer for fun    Social History Main Topics  . Smoking status: Never Smoker  . Smokeless tobacco: Never Used  . Alcohol use No  . Drug use: No  . Sexual activity: Not Asked   Other Topics Concern  . None   Social History Narrative  . None   Outpatient Encounter Prescriptions as of 01/15/2017  Medication Sig  . albuterol (PROVENTIL HFA;VENTOLIN HFA) 108 (90 BASE) MCG/ACT inhaler Inhale 2 puffs into the lungs every 6 (six) hours as needed for wheezing.  . Empagliflozin-Metformin HCl ER (SYNJARDY XR) 01-999 MG TB24 Take 1 tablet by mouth.  . fluticasone furoate-vilanterol (BREO  ELLIPTA) 200-25 MCG/INH AEPB Inhale 1 puff into the lungs daily.  Marland Kitchen omeprazole (PRILOSEC) 20 MG capsule Take 1 capsule (20 mg total) by mouth daily.  . Potassium Chloride (KLOR-CON 10 PO) Take by mouth 2 (two) times daily.  . psyllium (METAMUCIL SMOOTH TEXTURE) 28 % packet Take 1 packet by mouth 2 (two) times daily.  . valACYclovir (VALTREX) 500 MG tablet Take 500 mg by mouth daily as needed (outbreak).   Marland Kitchen dicyclomine (BENTYL) 10 MG capsule Take 1 tablet once or twice daily as needed for abdominal pain and gas.  . docusate sodium (COLACE) 100 MG capsule Take 100 mg by mouth daily.  Marland Kitchen glycerin adult 2 g suppository Place 1 suppository rectally as needed for constipation.  . Pancrelipase, Lip-Prot-Amyl, (CREON) 24000-76000 units CPEP Use 1 capsule with meals and snacks upto 6 a day   No facility-administered encounter medications on file as of 01/15/2017.     ALLERGIES: Allergies  Allergen Reactions  . Biaxin [Clarithromycin] Other (See Comments)    Hallucinations  . Carafate [Sucralfate] Other (See Comments)    Flared up her vertigo, run her blood sugar up and caused constipation  . Codeine Other (See Comments)    Hallucinations   . Doxycycline Other (See Comments)    GI upset , turned stool orange  .  Metronidazole Other (See Comments)    Heart Palpations -flagyl  . Cephalosporins Hives and Palpitations    VACCINATION STATUS:  There is no immunization history on file for this patient.  Diabetes  She presents for her initial diabetic visit. Diabetes type: Diabetes is due to pancreatectomy. Onset time: She was diagnosed at approximate age of 24. Her disease course has been worsening. There are no hypoglycemic associated symptoms. Pertinent negatives for hypoglycemia include no confusion, headaches, pallor or seizures. Associated symptoms include blurred vision, fatigue, polydipsia and polyuria. Pertinent negatives for diabetes include no chest pain and no polyphagia. There are no  hypoglycemic complications. Symptoms are worsening. There are no diabetic complications. Risk factors for coronary artery disease include diabetes mellitus, post-menopausal, sedentary lifestyle and family history. Current diabetic treatment includes oral agent (dual therapy) (She is taking Synjardy 01/999 g by mouth daily.). Her weight is decreasing steadily. She is following a generally unhealthy diet. When asked about meal planning, she reported none. She has not had a previous visit with a dietitian. She never participates in exercise. Her overall blood glucose range is 130-140 mg/dl. An ACE inhibitor/angiotensin II receptor blocker is not being taken. She does not see a podiatrist.Eye exam is not current.       Review of Systems  Constitutional: Positive for fatigue. Negative for chills, fever and unexpected weight change.  HENT: Negative for trouble swallowing and voice change.   Eyes: Positive for blurred vision. Negative for visual disturbance.  Respiratory: Negative for cough, shortness of breath and wheezing.   Cardiovascular: Negative for chest pain, palpitations and leg swelling.  Gastrointestinal: Positive for diarrhea. Negative for nausea and vomiting.  Endocrine: Positive for polydipsia and polyuria. Negative for cold intolerance, heat intolerance and polyphagia.  Musculoskeletal: Negative for arthralgias and myalgias.  Skin: Negative for color change, pallor, rash and wound.  Neurological: Negative for seizures and headaches.  Psychiatric/Behavioral: Negative for confusion and suicidal ideas.    Objective:    BP 115/68   Pulse 80   Ht 4' 11.5" (1.511 m)   Wt 117 lb (53.1 kg)   BMI 23.24 kg/m   Wt Readings from Last 3 Encounters:  01/15/17 117 lb (53.1 kg)  01/10/17 110 lb (49.9 kg)  10/07/16 121 lb (54.9 kg)     Physical Exam  Constitutional: She is oriented to person, place, and time. She appears well-developed.  HENT:  Head: Normocephalic and atraumatic.  Eyes:  EOM are normal.  Neck: Normal range of motion. Neck supple. No tracheal deviation present. No thyromegaly present.  Cardiovascular: Normal rate and regular rhythm.   Pulmonary/Chest: Effort normal and breath sounds normal.  Abdominal: Soft. Bowel sounds are normal. There is no tenderness. There is no guarding.  Musculoskeletal: Normal range of motion. She exhibits no edema.  Neurological: She is alert and oriented to person, place, and time. She has normal reflexes. No cranial nerve deficit. Coordination normal.  Skin: Skin is warm and dry. No rash noted. No erythema. No pallor.  Psychiatric: She has a normal mood and affect. Judgment normal.   CMP     Component Value Date/Time   NA 140 01/10/2017 1925   K 2.8 (L) 01/10/2017 1925   CL 108 01/10/2017 1925   CO2 22 01/10/2017 1925   GLUCOSE 204 (H) 01/10/2017 1925   BUN 5 (L) 01/10/2017 1925   CREATININE 0.55 01/10/2017 1925   CALCIUM 9.1 01/10/2017 1925   PROT 6.9 01/10/2017 1925   ALBUMIN 3.7 01/10/2017 1925   AST  20 01/10/2017 1925   ALT 33 01/10/2017 1925   ALKPHOS 76 01/10/2017 1925   BILITOT 1.0 01/10/2017 1925   GFRNONAA >60 01/10/2017 1925   GFRAA >60 01/10/2017 1925   Sep 02, 2016 A1c was 8.1%.   Assessment & Plan:   1. 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[place])  - Patient has currently uncontrolled,  Symptomatic, Pancreatic diabetes since age 64,  with most recent A1c of 8.1 %.  - She has no recent labs, will be sent for new set of labs including A1c.  - Her diabetes is due to Whipple  Procedure that is September 2016 at [facility] due to mucinous cystic lesions of the pancreatic head- remote follow of the pancreatic head- pancreaticoduodenectomy  - Betty Nguyen remains at a high risk for more acute and chronic complications which include CAD, CVA, CKD, retinopathy, and neuropathy. These are all discussed in detail with the patient.  - I have counseled her on diet management  by adopting a carbohydrate restricted/protein rich diet.  - Suggestion is made for her to avoid simple carbohydrates  from her diet including Cakes, Sweet Desserts, Ice Cream, Soda (diet and regular), Sweet Tea, Candies, Chips, Cookies, Store Bought Juices, Alcohol in Excess of  1-2 drinks a day, Artificial Sweeteners, and "Sugar-free" Products. This will help patient to have stable blood glucose profile and potentially avoid unintended weight gain.  - I encouraged her to switch to  unprocessed or minimally processed complex starch and increased protein intake (animal or plant source), fruits, and vegetables.  - she is advised to stick to a routine mealtimes to eat 3 meals  a day and avoid unnecessary snacks ( to snack only to correct hypoglycemia).   - she will be scheduled with Jearld Fenton, RDN, CDE for individualized DM education.  - I have approached her with the following individualized plan to manage diabetes and patient agrees:   - Given her history of pancreatectomy, she may eventually need insulin treatment to treat her diabetes to target. - At this time she is hesitant. However she is willing to initiate strict monitoring of blood glucose 4 times a day-before meals and at bedtime and return in one week with her meter and logs.  -Patient is encouraged to call clinic for blood glucose levels less than 70 or above 300 mg /dl. - The current medication she is taking, Iva Boop is not a fleeting choice for her, due to the fact that she does not have insulin resistance and she is unintentionally losing weight.  - she is not a candidate for incretin therapy either.  - Patient specific target  A1c;  LDL, HDL, Triglycerides, and  Waist Circumference were discussed in detail.  2) BP/HTN:  Controlled.  3) Lipids/HPL:   Lipid panel unknown.  She will have fasting lipid panel on subsequent visits. 4)  exocrine pancreatic insufficiency: She will benefit from Creon therapy. I have discussed and  initiated Creon 24,000 units, 1 with Meals and Snacks.   5) Chronic Care/Health Maintenance:  -she  Is not  on ACEI/ARB and Statin medications and  is encouraged to continue to follow up with Ophthalmology, Dentist,  Podiatrist at least yearly or according to recommendations, and advised to   stay away from smoking. I have recommended yearly flu vaccine and pneumonia vaccination at least every 5 years, and  sleep for at least 7 hours a day.  - Patient to bring meter and  blood glucose logs during her  next visit.  - I advised patient to maintain close follow up with Sinda Du, MD for primary care needs.  Follow up plan: - Return in about 1 week (around 01/22/2017) for meter, and logs, labs today.  Glade Lloyd, MD Phone: [phone]  Fax: [phone]   01/15/2017, 1:46 PM This note was partially dictated with voice recognition software. Similar sounding words can be transcribed inadequately or may not  be corrected upon review.

## 2017-01-16 LAB — HEMOGLOBIN A1C
HEMOGLOBIN A1C: 9.6 %{Hb} — AB (ref <5.7)
Mean Plasma Glucose: 229 (calc)
eAG (mmol/L): 12.7 (calc)

## 2017-01-16 LAB — COMPLETE METABOLIC PANEL WITH GFR
AG RATIO: 1.3 (calc) (ref 1.0–2.5)
ALBUMIN MSPROF: 3.3 g/dL — AB (ref 3.6–5.1)
ALT: 35 U/L — ABNORMAL HIGH (ref 6–29)
AST: 45 U/L — ABNORMAL HIGH (ref 10–35)
Alkaline phosphatase (APISO): 73 U/L (ref 33–130)
BILIRUBIN TOTAL: 1 mg/dL (ref 0.2–1.2)
BUN / CREAT RATIO: 6 (calc) (ref 6–22)
BUN: 4 mg/dL — ABNORMAL LOW (ref 7–25)
CHLORIDE: 106 mmol/L (ref 98–110)
CO2: 26 mmol/L (ref 20–32)
Calcium: 8.4 mg/dL — ABNORMAL LOW (ref 8.6–10.4)
Creat: 0.62 mg/dL (ref 0.60–0.93)
GFR, EST AFRICAN AMERICAN: 102 mL/min/{1.73_m2} (ref >=60)
GFR, Est Non African American: 88 mL/min/{1.73_m2} (ref >=60)
Globulin: 2.5 g/dL (calc) (ref 1.9–3.7)
Glucose, Bld: 413 mg/dL — ABNORMAL HIGH (ref 65–139)
POTASSIUM: 3.8 mmol/L (ref 3.5–5.3)
Sodium: 138 mmol/L (ref 135–146)
TOTAL PROTEIN: 5.8 g/dL — AB (ref 6.1–8.1)

## 2017-01-16 LAB — CELIAC DISEASE COMPREHENSIVE PANEL WITH REFLEXES
(tTG) Ab, IgA: 1 U/mL
Immunoglobulin A: 87 mg/dL (ref 81–463)

## 2017-01-16 LAB — MICROALBUMIN / CREATININE URINE RATIO
Creatinine, Urine: 63 mg/dL (ref 20–275)
MICROALB UR: 0.7 mg/dL
MICROALB/CREAT RATIO: 11 ug/mg{creat} (ref <30)

## 2017-01-16 LAB — TSH: TSH: 0.96 mIU/L (ref 0.40–4.50)

## 2017-01-16 LAB — T4, FREE: Free T4: 1 ng/dL (ref 0.8–1.8)

## 2017-01-19 ENCOUNTER — Telehealth: Payer: Self-pay | Admitting: "Endocrinology

## 2017-01-19 NOTE — Telephone Encounter (Signed)
Pt needs to keep her appointment. She will likely need initiation of insulin.

## 2017-01-19 NOTE — Telephone Encounter (Signed)
Called pt to see what here BG readings have been. She states that they have been in the 200's. She has  appt this week with meter/log

## 2017-01-19 NOTE — Telephone Encounter (Signed)
[facility] IS CALLING STATING THAT THEY HAVE CRITICAL LABS ON Betty Nguyen, PLEASE ADVISE WITH REFERENCE # [id number]

## 2017-01-22 ENCOUNTER — Encounter: Payer: Self-pay | Admitting: "Endocrinology

## 2017-01-22 ENCOUNTER — Ambulatory Visit (INDEPENDENT_AMBULATORY_CARE_PROVIDER_SITE_OTHER): Payer: Medicare Other | Admitting: "Endocrinology

## 2017-01-22 VITALS — BP 118/70 | HR 78 | Ht 59.5 in | Wt 109.0 lb

## 2017-01-22 DIAGNOSIS — E1169 Type 2 diabetes mellitus with other specified complication: Secondary | ICD-10-CM | POA: Diagnosis not present

## 2017-01-22 DIAGNOSIS — K8681 Exocrine pancreatic insufficiency: Secondary | ICD-10-CM | POA: Diagnosis not present

## 2017-01-22 DIAGNOSIS — K869 Disease of pancreas, unspecified: Secondary | ICD-10-CM

## 2017-01-22 MED ORDER — INSULIN GLARGINE 300 UNIT/ML ~~LOC~~ SOPN: 12.0000 [IU] | PEN_INJECTOR | Freq: Every day | SUBCUTANEOUS | 2 refills | Status: DC

## 2017-01-22 MED ORDER — GLUCOSE BLOOD VI STRP: ORAL_STRIP | 2 refills | Status: DC

## 2017-01-22 MED ORDER — ONETOUCH VERIO W/DEVICE KIT: 1.0000 | PACK | 0 refills | Status: DC | PRN

## 2017-01-22 MED ORDER — INSULIN PEN NEEDLE 32G X 4 MM MISC: 1.0000 | Freq: Four times a day (QID) | 5 refills | Status: DC

## 2017-01-22 NOTE — Progress Notes (Signed)
Subjective:    Patient ID: Betty Nguyen, female    DOB: January 22, 1941.  she is being seen in f/u for management of currently uncontrolled symptomatic diabetes requested by  Sinda Du, MD.   Past Medical History:  Diagnosis Date  . Anxiety   . Anxiety and depression   . Asthma   . Breast carcinoma ([place]) 08/2009   Invasive ductal; left; lumpectomy and sentinel node excision- oncology visits yearly.  . Chest pain   . Depression   . Diabetes mellitus type II, controlled ([place])    diet controlled,exercise, no meds  . Diverticulitis 2012   Treated medically  . Gastroesophageal reflux   . Hyperlipidemia   . Nephrolithiasis   . Tick bite    [place] Tick, alpha gal positive, makes her allergic to meat   Past Surgical History:  Procedure Laterality Date  . BREAST LUMPECTOMY     Left  . CESAREAN SECTION    . CHOLECYSTECTOMY    . COLONOSCOPY  2004  . DILATION AND CURETTAGE OF UTERUS    . EUS N/A 09/07/2014   Procedure: UPPER ENDOSCOPIC ULTRASOUND (EUS) LINEAR;  Surgeon: Milus Banister, MD;  Location: [facility];  Service: Endoscopy;  Laterality: N/A;  . NEPHROLITHOTOMY    . TUBAL LIGATION    . WHIPPLE PROCEDURE     Social History   Social History  . Marital status: Divorced    Spouse name: N/A  . Number of children: 2  . Years of education: N/A   Occupational History  . dancer for fun    Social History Main Topics  . Smoking status: Never Smoker  . Smokeless tobacco: Never Used  . Alcohol use No  . Drug use: No  . Sexual activity: Not Asked   Other Topics Concern  . None   Social History Narrative  . None   Outpatient Encounter Prescriptions as of 01/22/2017  Medication Sig  . albuterol (PROVENTIL HFA;VENTOLIN HFA) 108 (90 BASE) MCG/ACT inhaler Inhale 2 puffs into the lungs every 6 (six) hours as needed for wheezing.  . Blood Glucose Monitoring Suppl (ONETOUCH VERIO) w/Device KIT 1 each by Does not apply route as needed.  . dicyclomine (BENTYL) 10 MG  capsule Take 1 tablet once or twice daily as needed for abdominal pain and gas.  . docusate sodium (COLACE) 100 MG capsule Take 100 mg by mouth daily.  . fluticasone furoate-vilanterol (BREO ELLIPTA) 200-25 MCG/INH AEPB Inhale 1 puff into the lungs daily.  Marland Kitchen glucose blood (ONETOUCH VERIO) test strip Use as instructed  . glycerin adult 2 g suppository Place 1 suppository rectally as needed for constipation.  . Insulin Glargine (TOUJEO MAX SOLOSTAR) 300 UNIT/ML SOPN Inject 12 Units into the skin at bedtime.  . Insulin Pen Needle (BD PEN NEEDLE NANO U/F) 32G X 4 MM MISC 1 each by Does not apply route 4 (four) times daily.  Marland Kitchen omeprazole (PRILOSEC) 20 MG capsule Take 1 capsule (20 mg total) by mouth daily.  . Pancrelipase, Lip-Prot-Amyl, (CREON) 24000-76000 units CPEP Use 1 capsule with meals and snacks upto 6 a day  . Potassium Chloride (KLOR-CON 10 PO) Take by mouth 2 (two) times daily.  . psyllium (METAMUCIL SMOOTH TEXTURE) 28 % packet Take 1 packet by mouth 2 (two) times daily.  . valACYclovir (VALTREX) 500 MG tablet Take 500 mg by mouth daily as needed (outbreak).   . [DISCONTINUED] Empagliflozin-Metformin HCl ER (SYNJARDY XR) 01-999 MG TB24 Take 1 tablet by mouth.   No facility-administered  encounter medications on file as of 01/22/2017.     ALLERGIES: Allergies  Allergen Reactions  . Biaxin [Clarithromycin] Other (See Comments)    Hallucinations  . Carafate [Sucralfate] Other (See Comments)    Flared up her vertigo, run her blood sugar up and caused constipation  . Codeine Other (See Comments)    Hallucinations   . Doxycycline Other (See Comments)    GI upset , turned stool orange  . Metronidazole Other (See Comments)    Heart Palpations -flagyl  . Cephalosporins Hives and Palpitations    VACCINATION STATUS:  There is no immunization history on file for this patient.  Diabetes  She presents for her follow-up diabetic visit. Diabetes type: Diabetes is due to pancreatectomy.  Onset time: She was diagnosed at approximate age of 65. Her disease course has been worsening. There are no hypoglycemic associated symptoms. Pertinent negatives for hypoglycemia include no confusion, headaches, pallor or seizures. Associated symptoms include blurred vision, fatigue, polydipsia and polyuria. Pertinent negatives for diabetes include no chest pain and no polyphagia. There are no hypoglycemic complications. Symptoms are worsening. There are no diabetic complications. Risk factors for coronary artery disease include diabetes mellitus, post-menopausal, sedentary lifestyle and family history. Current diabetic treatment includes oral agent (dual therapy) (She is taking Synjardy 01/999 g by mouth daily.). Her weight is decreasing steadily. She is following a generally unhealthy diet. When asked about meal planning, she reported none. She has not had a previous visit with a dietitian. She never participates in exercise. Her breakfast blood glucose range is generally 140-180 mg/dl. Her lunch blood glucose range is generally >200 mg/dl. Her dinner blood glucose range is generally >200 mg/dl. Her bedtime blood glucose range is generally >200 mg/dl. Her overall blood glucose range is >200 mg/dl. An ACE inhibitor/angiotensin II receptor blocker is not being taken. She does not see a podiatrist.Eye exam is not current.     Review of Systems  Constitutional: Positive for fatigue. Negative for chills, fever and unexpected weight change.  HENT: Negative for trouble swallowing and voice change.   Eyes: Positive for blurred vision. Negative for visual disturbance.  Respiratory: Negative for cough, shortness of breath and wheezing.   Cardiovascular: Negative for chest pain, palpitations and leg swelling.  Gastrointestinal: Positive for diarrhea. Negative for nausea and vomiting.  Endocrine: Positive for polydipsia and polyuria. Negative for cold intolerance, heat intolerance and polyphagia.   Musculoskeletal: Negative for arthralgias and myalgias.  Skin: Negative for color change, pallor, rash and wound.  Neurological: Negative for seizures and headaches.  Psychiatric/Behavioral: Negative for confusion and suicidal ideas.    Objective:    BP 118/70   Pulse 78   Ht 4' 11.5" (1.511 m)   Wt 109 lb (49.4 kg)   BMI 21.65 kg/m   Wt Readings from Last 3 Encounters:  01/22/17 109 lb (49.4 kg)  01/15/17 117 lb (53.1 kg)  01/10/17 110 lb (49.9 kg)     Physical Exam  Constitutional: She is oriented to person, place, and time. She appears well-developed.  HENT:  Head: Normocephalic and atraumatic.  Eyes: EOM are normal.  Neck: Normal range of motion. Neck supple. No tracheal deviation present. No thyromegaly present.  Cardiovascular: Normal rate and regular rhythm.   Pulmonary/Chest: Effort normal and breath sounds normal.  Abdominal: Soft. Bowel sounds are normal. There is no tenderness. There is no guarding.  Musculoskeletal: Normal range of motion. She exhibits no edema.  Neurological: She is alert and oriented to person, place, and time.  She has normal reflexes. No cranial nerve deficit. Coordination normal.  Skin: Skin is warm and dry. No rash noted. No erythema. No pallor.  Psychiatric: She has a normal mood and affect. Judgment normal.    Recent Results (from the past 2160 hour(s))  Urinalysis, Routine w reflex microscopic     Status: Abnormal   Collection Time: 01/10/17  6:15 PM  Result Value Ref Range   Color, Urine STRAW (A) YELLOW   APPearance CLEAR CLEAR   Specific Gravity, Urine 1.014 1.005 - 1.030   pH 6.0 5.0 - 8.0   Glucose, UA >=500 (A) NEGATIVE mg/dL   Hgb urine dipstick SMALL (A) NEGATIVE   Bilirubin Urine NEGATIVE NEGATIVE   Ketones, ur NEGATIVE NEGATIVE mg/dL   Protein, ur NEGATIVE NEGATIVE mg/dL   Nitrite NEGATIVE NEGATIVE   Leukocytes, UA NEGATIVE NEGATIVE   RBC / HPF 0-5 0 - 5 RBC/hpf   WBC, UA 0-5 0 - 5 WBC/hpf   Bacteria, UA NONE SEEN  NONE SEEN   Squamous Epithelial / LPF NONE SEEN NONE SEEN  CBC with Differential/Platelet     Status: None   Collection Time: 01/10/17  7:25 PM  Result Value Ref Range   WBC 7.9 4.0 - 10.5 K/uL   RBC 4.40 3.87 - 5.11 MIL/uL   Hemoglobin 13.5 12.0 - 15.0 g/dL   HCT 40.3 36.0 - 46.0 %   MCV 91.6 78.0 - 100.0 fL   MCH 30.7 26.0 - 34.0 pg   MCHC 33.5 30.0 - 36.0 g/dL   RDW 13.5 11.5 - 15.5 %   Platelets 197 150 - 400 K/uL   Neutrophils Relative % 59 %   Neutro Abs 4.7 1.7 - 7.7 K/uL   Lymphocytes Relative 31 %   Lymphs Abs 2.5 0.7 - 4.0 K/uL   Monocytes Relative 8 %   Monocytes Absolute 0.6 0.1 - 1.0 K/uL   Eosinophils Relative 2 %   Eosinophils Absolute 0.1 0.0 - 0.7 K/uL   Basophils Relative 0 %   Basophils Absolute 0.0 0.0 - 0.1 K/uL  Comprehensive metabolic panel     Status: Abnormal   Collection Time: 01/10/17  7:25 PM  Result Value Ref Range   Sodium 140 135 - 145 mmol/L   Potassium 2.8 (L) 3.5 - 5.1 mmol/L   Chloride 108 101 - 111 mmol/L   CO2 22 22 - 32 mmol/L   Glucose, Bld 204 (H) 65 - 99 mg/dL   BUN 5 (L) 6 - 20 mg/dL   Creatinine, Ser 0.55 0.44 - 1.00 mg/dL   Calcium 9.1 8.9 - 10.3 mg/dL   Total Protein 6.9 6.5 - 8.1 g/dL   Albumin 3.7 3.5 - 5.0 g/dL   AST 20 15 - 41 U/L   ALT 33 14 - 54 U/L   Alkaline Phosphatase 76 38 - 126 U/L   Total Bilirubin 1.0 0.3 - 1.2 mg/dL   GFR calc non Af Amer >60 >60 mL/min   GFR calc Af Amer >60 >60 mL/min    Comment: (NOTE) The eGFR has been calculated using the CKD EPI equation. This calculation has not been validated in all clinical situations. eGFR's persistently <60 mL/min signify possible Chronic Kidney Disease.    Anion gap 10 5 - 15  COMPLETE METABOLIC PANEL WITH GFR     Status: Abnormal   Collection Time: 01/15/17  9:07 AM  Result Value Ref Range   Glucose, Bld 413 (H) 65 - 139 mg/dL    Comment: Verified by  repeat analysis. . .        Non-fasting reference interval .    BUN 4 (L) 7 - 25 mg/dL   Creat 0.62  0.60 - 0.93 mg/dL    Comment: For patients >18 years of age, the reference limit for Creatinine is approximately 13% higher for people identified as African-American. .    GFR, Est Non African American 88 > OR = 60 mL/min/1.22m   GFR, Est African American 102 > OR = 60 mL/min/1.771m  BUN/Creatinine Ratio 6 6 - 22 (calc)   Sodium 138 135 - 146 mmol/L   Potassium 3.8 3.5 - 5.3 mmol/L   Chloride 106 98 - 110 mmol/L   CO2 26 20 - 32 mmol/L   Calcium 8.4 (L) 8.6 - 10.4 mg/dL   Total Protein 5.8 (L) 6.1 - 8.1 g/dL   Albumin 3.3 (L) 3.6 - 5.1 g/dL   Globulin 2.5 1.9 - 3.7 g/dL (calc)   AG Ratio 1.3 1.0 - 2.5 (calc)   Total Bilirubin 1.0 0.2 - 1.2 mg/dL   Alkaline phosphatase (APISO) 73 33 - 130 U/L   AST 45 (H) 10 - 35 U/L   ALT 35 (H) 6 - 29 U/L  Hemoglobin A1c     Status: Abnormal   Collection Time: 01/15/17  9:07 AM  Result Value Ref Range   Hgb A1c MFr Bld 9.6 (H) <5.7 % of total Hgb    Comment: For someone without known diabetes, a hemoglobin A1c value of 6.5% or greater indicates that they may have  diabetes and this should be confirmed with a follow-up  test. . For someone with known diabetes, a value <7% indicates  that their diabetes is well controlled and a value  greater than or equal to 7% indicates suboptimal  control. A1c targets should be individualized based on  duration of diabetes, age, comorbid conditions, and  other considerations. . Currently, no consensus exists regarding use of hemoglobin A1c for diagnosis of diabetes for children. .    Mean Plasma Glucose 229 (calc)   eAG (mmol/L) 12.7 (calc)  TSH     Status: None   Collection Time: 01/15/17  9:07 AM  Result Value Ref Range   TSH 0.96 0.40 - 4.50 mIU/L  T4, free     Status: None   Collection Time: 01/15/17  9:07 AM  Result Value Ref Range   Free T4 1.0 0.8 - 1.8 ng/dL  Microalbumin / creatinine urine ratio     Status: None   Collection Time: 01/15/17  9:07 AM  Result Value Ref Range    Creatinine, Urine 63 20 - 275 mg/dL   Microalb, Ur 0.7 mg/dL    Comment: Reference Range Not established    Microalb Creat Ratio 11 <30 mcg/mg creat    Comment: . The ADA defines abnormalities in albumin excretion as follows: . Marland Kitchenategory         Result (mcg/mg creatinine) . Normal                    <30 Microalbuminuria         30-299  Clinical albuminuria   > OR = 300 . The ADA recommends that at least two of three specimens collected within a 3-6 month period be abnormal before considering a patient to be within a diagnostic category.   Celiac Disease Comprehensive Panel with Reflexes     Status: None   Collection Time: 01/15/17  9:07 AM  Result Value Ref  Range   INTERPRETATION      Comment: . No serological evidence of celiac disease. Marland Kitchen tTG IgA may normalize in individuals with celiac disease who maintain a gluten-free diet. Consider HLA DQ2 and  DQ8 testing to rule out celiac disease. Celiac disease  is extremely rare in the absence of DQ2 or DQ8. .    (tTG) Ab, IgA 1 U/mL    Comment: .        Value      Interpretation        -----      --------------        <4         No Antibody Detected        > or = 4   Antibody Detected .    Immunoglobulin A 87 81 - 463 mg/dL    Sep 02, 2016 A1c was 8.1%.   Assessment & Plan:   1. Diabetes mellitus associated with pancreatic disease [facility])  - Patient has currently uncontrolled,  Symptomatic, Pancreatic diabetes since age 53,  with most recent A1c of 9.6 %.  - Her diabetes is due to [place]ipple  Procedure that is September 2016 [facility] due to mucinous cystic lesions of the pancreatic head- removal of the pancreatic head- pancreaticoduodenectomy. She came with average blood glucose of 225 over the last 7 days.  - Betty Nguyen remains at a high risk for more acute and chronic complications which include CAD, CVA, CKD, retinopathy, and neuropathy. These are all discussed in detail with the  patient.  - I have counseled her on diet management by adopting a carbohydrate restricted/protein rich diet.  - Suggestion is made for her to avoid simple carbohydrates  from her diet including Cakes, Sweet Desserts, Ice Cream, Soda (diet and regular), Sweet Tea, Candies, Chips, Cookies, Store Bought Juices, Alcohol in Excess of  1-2 drinks a day, Artificial Sweeteners, and "Sugar-free" Products. This will help patient to have stable blood glucose profile and potentially avoid unintended weight gain.  - I encouraged her to switch to  unprocessed or minimally processed complex starch and increased protein intake (animal or plant source), fruits, and vegetables.  - she is advised to stick to a routine mealtimes to eat 3 meals  a day and avoid unnecessary snacks ( to snack only to correct hypoglycemia).   - she will be scheduled with Jearld Fenton, RDN, CDE for individualized DM education- consult pending.  - I have approached her with the following individualized plan to manage diabetes and patient agrees:   - Given her history of pancreatectomy, and her current presentation with severe hyperglycemia, she is approached for basal insulin and she agrees. - She will be initiated on low-dose Toujeo 12 units daily at bedtime associated with strict monitoring of blood glucose 4 times a day-before meals and at bedtime and return in one week with her meter and logs for dose adjustment. - Proper insulin use technique was demonstrated by myself and my nurse in the exam room.  -Patient is encouraged to call clinic for blood glucose levels less than 70 or above 300 mg /dl. -  I advised her to discontinue Synjardy , since this is not fitting for the pathology of her diabetes.  - Insulin is exclusive choice of therapy for her, she may need bolus insulin if glycemic profile is not controlled with basal insulin. - she is not a candidate for incretin therapy either.  - Patient specific target  A1c;  LDL, HDL,  Triglycerides, and  Waist Circumference were discussed in detail.  2) BP/HTN:  Controlled.  3) Lipids/HPL:   Lipid panel unknown.  She will have fasting lipid panel on subsequent visits. 4)  exocrine pancreatic insufficiency: She will benefit from Creon therapy. I have discussed and initiated Creon 24,000 units, 1 with Meals and Snacks, she reports symptomatic improvement with this medication. - Her celiac screen is negative, I advised her to follow regular diet.  5) Chronic Care/Health Maintenance:  -she  Is not  on ACEI/ARB and Statin medications and  is encouraged to continue to follow up with Ophthalmology, Dentist,  Podiatrist at least yearly or according to recommendations, and advised to   stay away from smoking. I have recommended yearly flu vaccine and pneumonia vaccination at least every 5 years, and  sleep for at least 7 hours a day.  - Time spent with the patient: 25 min, of which >50% was spent in reviewing her sugar logs , discussing her hypo- and hyper-glycemic episodes, reviewing her current and  previous labs and insulin doses and developing a plan to avoid hypo- and hyper-glycemia.   - I advised patient to maintain close follow up with Sinda Du, MD for primary care needs.  Follow up plan: - Return in about 1 week (around 01/29/2017) for follow up with meter and logs- no labs.  Glade Lloyd, MD Phone: [phone]  Fax: [phone]   01/22/2017, 9:25 AM This note was partially dictated with voice recognition software. Similar sounding words can be transcribed inadequately or may not  be corrected upon review.

## 2017-01-23 ENCOUNTER — Other Ambulatory Visit: Payer: Self-pay | Admitting: "Endocrinology

## 2017-01-23 DIAGNOSIS — E1165 Type 2 diabetes mellitus with hyperglycemia: Secondary | ICD-10-CM

## 2017-01-23 DIAGNOSIS — E118 Type 2 diabetes mellitus with unspecified complications: Principal | ICD-10-CM

## 2017-01-23 DIAGNOSIS — IMO0002 Reserved for concepts with insufficient information to code with codable children: Secondary | ICD-10-CM

## 2017-01-29 ENCOUNTER — Encounter: Payer: Self-pay | Admitting: "Endocrinology

## 2017-01-29 ENCOUNTER — Ambulatory Visit (INDEPENDENT_AMBULATORY_CARE_PROVIDER_SITE_OTHER): Payer: Medicare Other | Admitting: "Endocrinology

## 2017-01-29 VITALS — BP 112/65 | HR 68 | Ht 59.5 in | Wt 111.0 lb

## 2017-01-29 DIAGNOSIS — E1169 Type 2 diabetes mellitus with other specified complication: Secondary | ICD-10-CM | POA: Diagnosis not present

## 2017-01-29 DIAGNOSIS — K8681 Exocrine pancreatic insufficiency: Secondary | ICD-10-CM

## 2017-01-29 DIAGNOSIS — K869 Disease of pancreas, unspecified: Secondary | ICD-10-CM

## 2017-01-29 MED ORDER — FREESTYLE LIBRE READER DEVI: 1.0000 | Freq: Once | 0 refills | Status: AC

## 2017-01-29 MED ORDER — GLUCOSE BLOOD VI STRP: ORAL_STRIP | 6 refills | Status: DC

## 2017-01-29 MED ORDER — FREESTYLE LIBRE SENSOR SYSTEM MISC: 2 refills | Status: DC

## 2017-01-29 MED ORDER — INSULIN ASPART 100 UNIT/ML FLEXPEN: 4.0000 [IU] | PEN_INJECTOR | Freq: Three times a day (TID) | SUBCUTANEOUS | 2 refills | Status: DC

## 2017-01-29 NOTE — Progress Notes (Signed)
Subjective:    Patient ID: Betty Nguyen, female    DOB: 18-Mar-1941.  she is being seen in f/u for management of currently uncontrolled symptomatic diabetes requested by  Sinda Du, MD.   Past Medical History:  Diagnosis Date  . Anxiety   . Anxiety and depression   . Asthma   . Breast carcinoma ([place]) 08/2009   Invasive ductal; left; lumpectomy and sentinel node excision- oncology visits yearly.  . Chest pain   . Depression   . Diabetes mellitus type II, controlled ([place])    diet controlled,exercise, no meds  . Diverticulitis 2012   Treated medically  . Gastroesophageal reflux   . Hyperlipidemia   . Nephrolithiasis   . Tick bite    [place] Tick, alpha gal positive, makes her allergic to meat   Past Surgical History:  Procedure Laterality Date  . BREAST LUMPECTOMY     Left  . CESAREAN SECTION    . CHOLECYSTECTOMY    . COLONOSCOPY  2004  . DILATION AND CURETTAGE OF UTERUS    . EUS N/A 09/07/2014   Procedure: UPPER ENDOSCOPIC ULTRASOUND (EUS) LINEAR;  Surgeon: Milus Banister, MD;  Location: [facility];  Service: Endoscopy;  Laterality: N/A;  . NEPHROLITHOTOMY    . TUBAL LIGATION    . WHIPPLE PROCEDURE     Social History   Social History  . Marital status: Divorced    Spouse name: N/A  . Number of children: 2  . Years of education: N/A   Occupational History  . dancer for fun    Social History Main Topics  . Smoking status: Never Smoker  . Smokeless tobacco: Never Used  . Alcohol use No  . Drug use: No  . Sexual activity: Not on file   Other Topics Concern  . Not on file   Social History Narrative  . No narrative on file   Outpatient Encounter Prescriptions as of 01/29/2017  Medication Sig  . albuterol (PROVENTIL HFA;VENTOLIN HFA) 108 (90 BASE) MCG/ACT inhaler Inhale 2 puffs into the lungs every 6 (six) hours as needed for wheezing.  . Blood Glucose Monitoring Suppl (ONETOUCH VERIO) w/Device KIT 1 each by Does not apply route as needed.  .  Continuous Blood Gluc Receiver (FREESTYLE LIBRE READER) DEVI 1 Piece by Does not apply route once.  . Continuous Blood Gluc Sensor (FREESTYLE LIBRE SENSOR SYSTEM) MISC Use one sensor every 10 days.  Marland Kitchen dicyclomine (BENTYL) 10 MG capsule Take 1 tablet once or twice daily as needed for abdominal pain and gas.  . docusate sodium (COLACE) 100 MG capsule Take 100 mg by mouth daily.  . fluticasone furoate-vilanterol (BREO ELLIPTA) 200-25 MCG/INH AEPB Inhale 1 puff into the lungs daily.  Marland Kitchen glucose blood (FORA BLOOD GLUCOSE TEST) test strip Use as instructed  . glucose blood (ONETOUCH VERIO) test strip Use as instructed  . glycerin adult 2 g suppository Place 1 suppository rectally as needed for constipation.  . insulin aspart (NOVOLOG FLEXPEN) 100 UNIT/ML FlexPen Inject 4-10 Units into the skin 3 (three) times daily with meals.  . Insulin Glargine (TOUJEO MAX SOLOSTAR) 300 UNIT/ML SOPN Inject 12 Units into the skin at bedtime.  . Insulin Pen Needle (BD PEN NEEDLE NANO U/F) 32G X 4 MM MISC 1 each by Does not apply route 4 (four) times daily.  Marland Kitchen omeprazole (PRILOSEC) 20 MG capsule Take 1 capsule (20 mg total) by mouth daily.  . Pancrelipase, Lip-Prot-Amyl, (CREON) 24000-76000 units CPEP Use 1 capsule with meals  and snacks upto 6 a day  . Potassium Chloride (KLOR-CON 10 PO) Take by mouth 2 (two) times daily.  . psyllium (METAMUCIL SMOOTH TEXTURE) 28 % packet Take 1 packet by mouth 2 (two) times daily.  . valACYclovir (VALTREX) 500 MG tablet Take 500 mg by mouth daily as needed (outbreak).    No facility-administered encounter medications on file as of 01/29/2017.     ALLERGIES: Allergies  Allergen Reactions  . Biaxin [Clarithromycin] Other (See Comments)    Hallucinations  . Carafate [Sucralfate] Other (See Comments)    Flared up her vertigo, run her blood sugar up and caused constipation  . Codeine Other (See Comments)    Hallucinations   . Doxycycline Other (See Comments)    GI upset , turned  stool orange  . Metronidazole Other (See Comments)    Heart Palpations -flagyl  . Cephalosporins Hives and Palpitations    VACCINATION STATUS:  There is no immunization history on file for this patient.  Diabetes  She presents for her follow-up diabetic visit. Diabetes type: Diabetes is due to pancreatectomy. Onset time: She was diagnosed at approximate age of 43. Her disease course has been improving. There are no hypoglycemic associated symptoms. Pertinent negatives for hypoglycemia include no confusion, headaches, pallor or seizures. Associated symptoms include blurred vision, fatigue, polydipsia and polyuria. Pertinent negatives for diabetes include no chest pain and no polyphagia. There are no hypoglycemic complications. Symptoms are improving. There are no diabetic complications. Risk factors for coronary artery disease include diabetes mellitus, post-menopausal, sedentary lifestyle and family history. Current diabetic treatment includes oral agent (dual therapy) (She is taking Synjardy 01/999 g by mouth daily.). Her weight is increasing steadily. She is following a generally unhealthy diet. When asked about meal planning, she reported none. She has had a previous visit with a dietitian. She never participates in exercise. Her breakfast blood glucose range is generally 140-180 mg/dl. Her lunch blood glucose range is generally >200 mg/dl. Her dinner blood glucose range is generally >200 mg/dl. Her bedtime blood glucose range is generally >200 mg/dl. Her overall blood glucose range is >200 mg/dl. An ACE inhibitor/angiotensin II receptor blocker is not being taken. She does not see a podiatrist.Eye exam is not current.     Review of Systems  Constitutional: Positive for fatigue. Negative for chills, fever and unexpected weight change.  HENT: Negative for trouble swallowing and voice change.   Eyes: Positive for blurred vision. Negative for visual disturbance.  Respiratory: Negative for cough,  shortness of breath and wheezing.   Cardiovascular: Negative for chest pain, palpitations and leg swelling.  Gastrointestinal: Positive for diarrhea. Negative for nausea and vomiting.  Endocrine: Positive for polydipsia and polyuria. Negative for cold intolerance, heat intolerance and polyphagia.  Musculoskeletal: Negative for arthralgias and myalgias.  Skin: Negative for color change, pallor, rash and wound.  Neurological: Negative for seizures and headaches.  Psychiatric/Behavioral: Negative for confusion and suicidal ideas.    Objective:    BP 112/65   Pulse 68   Ht 4' 11.5" (1.511 m)   Wt 111 lb (50.3 kg)   BMI 22.04 kg/m   Wt Readings from Last 3 Encounters:  01/29/17 111 lb (50.3 kg)  01/22/17 109 lb (49.4 kg)  01/15/17 117 lb (53.1 kg)     Physical Exam  Constitutional: She is oriented to person, place, and time. She appears well-developed.  HENT:  Head: Normocephalic and atraumatic.  Eyes: EOM are normal.  Neck: Normal range of motion. Neck supple. No tracheal  deviation present. No thyromegaly present.  Cardiovascular: Normal rate and regular rhythm.   Pulmonary/Chest: Effort normal and breath sounds normal.  Abdominal: Soft. Bowel sounds are normal. There is no tenderness. There is no guarding.  Musculoskeletal: Normal range of motion. She exhibits no edema.  Neurological: She is alert and oriented to person, place, and time. She has normal reflexes. No cranial nerve deficit. Coordination normal.  Skin: Skin is warm and dry. No rash noted. No erythema. No pallor.  Psychiatric: She has a normal mood and affect. Judgment normal.    Recent Results (from the past 2160 hour(s))  Urinalysis, Routine w reflex microscopic     Status: Abnormal   Collection Time: 01/10/17  6:15 PM  Result Value Ref Range   Color, Urine STRAW (A) YELLOW   APPearance CLEAR CLEAR   Specific Gravity, Urine 1.014 1.005 - 1.030   pH 6.0 5.0 - 8.0   Glucose, UA >=500 (A) NEGATIVE mg/dL   Hgb  urine dipstick SMALL (A) NEGATIVE   Bilirubin Urine NEGATIVE NEGATIVE   Ketones, ur NEGATIVE NEGATIVE mg/dL   Protein, ur NEGATIVE NEGATIVE mg/dL   Nitrite NEGATIVE NEGATIVE   Leukocytes, UA NEGATIVE NEGATIVE   RBC / HPF 0-5 0 - 5 RBC/hpf   WBC, UA 0-5 0 - 5 WBC/hpf   Bacteria, UA NONE SEEN NONE SEEN   Squamous Epithelial / LPF NONE SEEN NONE SEEN  CBC with Differential/Platelet     Status: None   Collection Time: 01/10/17  7:25 PM  Result Value Ref Range   WBC 7.9 4.0 - 10.5 K/uL   RBC 4.40 3.87 - 5.11 MIL/uL   Hemoglobin 13.5 12.0 - 15.0 g/dL   HCT 40.3 36.0 - 46.0 %   MCV 91.6 78.0 - 100.0 fL   MCH 30.7 26.0 - 34.0 pg   MCHC 33.5 30.0 - 36.0 g/dL   RDW 13.5 11.5 - 15.5 %   Platelets 197 150 - 400 K/uL   Neutrophils Relative % 59 %   Neutro Abs 4.7 1.7 - 7.7 K/uL   Lymphocytes Relative 31 %   Lymphs Abs 2.5 0.7 - 4.0 K/uL   Monocytes Relative 8 %   Monocytes Absolute 0.6 0.1 - 1.0 K/uL   Eosinophils Relative 2 %   Eosinophils Absolute 0.1 0.0 - 0.7 K/uL   Basophils Relative 0 %   Basophils Absolute 0.0 0.0 - 0.1 K/uL  Comprehensive metabolic panel     Status: Abnormal   Collection Time: 01/10/17  7:25 PM  Result Value Ref Range   Sodium 140 135 - 145 mmol/L   Potassium 2.8 (L) 3.5 - 5.1 mmol/L   Chloride 108 101 - 111 mmol/L   CO2 22 22 - 32 mmol/L   Glucose, Bld 204 (H) 65 - 99 mg/dL   BUN 5 (L) 6 - 20 mg/dL   Creatinine, Ser 0.55 0.44 - 1.00 mg/dL   Calcium 9.1 8.9 - 10.3 mg/dL   Total Protein 6.9 6.5 - 8.1 g/dL   Albumin 3.7 3.5 - 5.0 g/dL   AST 20 15 - 41 U/L   ALT 33 14 - 54 U/L   Alkaline Phosphatase 76 38 - 126 U/L   Total Bilirubin 1.0 0.3 - 1.2 mg/dL   GFR calc non Af Amer >60 >60 mL/min   GFR calc Af Amer >60 >60 mL/min    Comment: (NOTE) The eGFR has been calculated using the CKD EPI equation. This calculation has not been validated in all clinical situations. eGFR's  persistently <60 mL/min signify possible Chronic Kidney Disease.    Anion gap 10  5 - 15  COMPLETE METABOLIC PANEL WITH GFR     Status: Abnormal   Collection Time: 01/15/17  9:07 AM  Result Value Ref Range   Glucose, Bld 413 (H) 65 - 139 mg/dL    Comment: Verified by repeat analysis. . .        Non-fasting reference interval .    BUN 4 (L) 7 - 25 mg/dL   Creat 0.62 0.60 - 0.93 mg/dL    Comment: For patients >76 years of age, the reference limit for Creatinine is approximately 13% higher for people identified as African-American. .    GFR, Est Non African American 88 > OR = 60 mL/min/1.36m   GFR, Est African American 102 > OR = 60 mL/min/1.745m  BUN/Creatinine Ratio 6 6 - 22 (calc)   Sodium 138 135 - 146 mmol/L   Potassium 3.8 3.5 - 5.3 mmol/L   Chloride 106 98 - 110 mmol/L   CO2 26 20 - 32 mmol/L   Calcium 8.4 (L) 8.6 - 10.4 mg/dL   Total Protein 5.8 (L) 6.1 - 8.1 g/dL   Albumin 3.3 (L) 3.6 - 5.1 g/dL   Globulin 2.5 1.9 - 3.7 g/dL (calc)   AG Ratio 1.3 1.0 - 2.5 (calc)   Total Bilirubin 1.0 0.2 - 1.2 mg/dL   Alkaline phosphatase (APISO) 73 33 - 130 U/L   AST 45 (H) 10 - 35 U/L   ALT 35 (H) 6 - 29 U/L  Hemoglobin A1c     Status: Abnormal   Collection Time: 01/15/17  9:07 AM  Result Value Ref Range   Hgb A1c MFr Bld 9.6 (H) <5.7 % of total Hgb    Comment: For someone without known diabetes, a hemoglobin A1c value of 6.5% or greater indicates that they may have  diabetes and this should be confirmed with a follow-up  test. . For someone with known diabetes, a value <7% indicates  that their diabetes is well controlled and a value  greater than or equal to 7% indicates suboptimal  control. A1c targets should be individualized based on  duration of diabetes, age, comorbid conditions, and  other considerations. . Currently, no consensus exists regarding use of hemoglobin A1c for diagnosis of diabetes for children. .    Mean Plasma Glucose 229 (calc)   eAG (mmol/L) 12.7 (calc)  TSH     Status: None   Collection Time: 01/15/17  9:07 AM  Result  Value Ref Range   TSH 0.96 0.40 - 4.50 mIU/L  T4, free     Status: None   Collection Time: 01/15/17  9:07 AM  Result Value Ref Range   Free T4 1.0 0.8 - 1.8 ng/dL  Microalbumin / creatinine urine ratio     Status: None   Collection Time: 01/15/17  9:07 AM  Result Value Ref Range   Creatinine, Urine 63 20 - 275 mg/dL   Microalb, Ur 0.7 mg/dL    Comment: Reference Range Not established    Microalb Creat Ratio 11 <30 mcg/mg creat    Comment: . The ADA defines abnormalities in albumin excretion as follows: . Marland Kitchenategory         Result (mcg/mg creatinine) . Normal                    <30 Microalbuminuria         30-299  Clinical albuminuria   >  OR = 300 . The ADA recommends that at least two of three specimens collected within a 3-6 month period be abnormal before considering a patient to be within a diagnostic category.   Celiac Disease Comprehensive Panel with Reflexes     Status: None   Collection Time: 01/15/17  9:07 AM  Result Value Ref Range   INTERPRETATION      Comment: . No serological evidence of celiac disease. Marland Kitchen tTG IgA may normalize in individuals with celiac disease who maintain a gluten-free diet. Consider HLA DQ2 and  DQ8 testing to rule out celiac disease. Celiac disease  is extremely rare in the absence of DQ2 or DQ8. .    (tTG) Ab, IgA 1 U/mL    Comment: .        Value      Interpretation        -----      --------------        <4         No Antibody Detected        > or = 4   Antibody Detected .    Immunoglobulin A 87 81 - 463 mg/dL    Sep 02, 2016 A1c was 8.1%.   Assessment & Plan:   1. Diabetes mellitus associated with pancreatic disease [facility])  - Patient has currently uncontrolled,  Symptomatic, Pancreatic diabetes since age 60,  with most recent A1c of 9.6 %.  - Her diabetes is due to [place]ipple  Procedure in September 2016 at [facility] due to mucinous cystic lesions of the pancreatic head- removal of the pancreatic  head- pancreaticoduodenectomy.   - Kerrin Mo Baker remains at a high risk for more acute and chronic complications which include CAD, CVA, CKD, retinopathy, and neuropathy. These are all discussed in detail with the patient.  - I have counseled her on diet management by adopting a carbohydrate restricted/protein rich diet.  - Suggestion is made for her to avoid simple carbohydrates  from her diet including Cakes, Sweet Desserts, Ice Cream, Soda (diet and regular), Sweet Tea, Candies, Chips, Cookies, Store Bought Juices, Alcohol in Excess of  1-2 drinks a day, Artificial Sweeteners, and "Sugar-free" Products. This will help patient to have stable blood glucose profile and potentially avoid unintended weight gain.  - I encouraged her to switch to  unprocessed or minimally processed complex starch and increased protein intake (animal or plant source), fruits, and vegetables.  - she is advised to stick to a routine mealtimes to eat 3 meals  a day and avoid unnecessary snacks ( to snack only to correct hypoglycemia).    - I have approached her with the following individualized plan to manage diabetes and patient agrees:   -  She came with controlled fasting blood glucose profile, however, significantly above target postprandial blood glucose profile.  - Given her history of pancreatectomy, and her current presentation with severe postprandial hyperglycemia, she is approached for basal/bolus insulin and she agrees. - She will continue on Toujeo 12 units daily at bedtime, initiate NovoLog 4 units 3 times a day before meals for pre-meal blood glucose of 90 mg/dL (with additional dose for readings above 150 mg/dL) associated with strict monitoring of blood glucose 4 times a day-before meals and at bedtime and return in one week with her meter and logs for dose adjustment.  - Proper insulin use technique was demonstrated  Again by myself and my nurse in the exam room.  -Patient is encouraged  to call clinic  for blood glucose levels less than 70 or above 300 mg /dl. - She will benefit from continued glucose monitoring. I have discussed and initiated a prescription for that free style Libre device for her.  - Insulin is exclusive choice of therapy for her.  - she is not a candidate for metformin, SGLT2 inhibitors, nor incretin therapy .  - Patient specific target  A1c;  LDL, HDL, Triglycerides, and  Waist Circumference were discussed in detail.  2) BP/HTN:  Controlled.  3) Lipids/HPL:   Lipid panel unknown.  She will have fasting lipid panel on subsequent visits. 4)  exocrine pancreatic insufficiency: She  is benefiting from Creon therapy. I advised her to continue  Creon 24,000 units, 1 with Meals and Snacks, she reports symptomatic improvement with this medication. - Her celiac screen is negative, I advised her to follow gluten-regular diet.  5) Chronic Care/Health Maintenance:  -she  Is not  on ACEI/ARB and Statin medications and  is encouraged to continue to follow up with Ophthalmology, Dentist,  Podiatrist at least yearly or according to recommendations, and advised to   stay away from smoking. I have recommended yearly flu vaccine and pneumonia vaccination at least every 5 years, and  sleep for at least 7 hours a day.  - Time spent with the patient: 25 min, of which >50% was spent in reviewing her sugar logs , discussing her hypo- and hyper-glycemic episodes, reviewing her current and  previous labs and insulin doses and developing a plan to avoid hypo- and hyper-glycemia.    - I advised patient to maintain close follow up with Sinda Du, MD for primary care needs.  Follow up plan: - Return in about 1 week (around 02/05/2017) for follow up with meter and logs- no labs.  Glade Lloyd, MD Phone: [phone]  Fax: [phone]   01/29/2017, 11:51 AM This note was partially dictated with voice recognition software. Similar sounding words can be transcribed inadequately or may not  be  corrected upon review.

## 2017-02-05 ENCOUNTER — Encounter: Payer: Self-pay | Admitting: "Endocrinology

## 2017-02-05 ENCOUNTER — Ambulatory Visit (INDEPENDENT_AMBULATORY_CARE_PROVIDER_SITE_OTHER): Payer: Medicare Other | Admitting: "Endocrinology

## 2017-02-05 VITALS — BP 115/66 | HR 69 | Ht 59.5 in | Wt 117.0 lb

## 2017-02-05 DIAGNOSIS — K8681 Exocrine pancreatic insufficiency: Secondary | ICD-10-CM

## 2017-02-05 DIAGNOSIS — K869 Disease of pancreas, unspecified: Secondary | ICD-10-CM

## 2017-02-05 DIAGNOSIS — E1169 Type 2 diabetes mellitus with other specified complication: Secondary | ICD-10-CM | POA: Diagnosis not present

## 2017-02-05 NOTE — Progress Notes (Signed)
Subjective:    Patient ID: Betty Nguyen, female    DOB: January 06, 1941.  she is being seen in f/u for management of currently uncontrolled symptomatic diabetes requested by  Sinda Du, MD.   Past Medical History:  Diagnosis Date  . Anxiety   . Anxiety and depression   . Asthma   . Breast carcinoma ([place]) 08/2009   Invasive ductal; left; lumpectomy and sentinel node excision- oncology visits yearly.  . Chest pain   . Depression   . Diabetes mellitus type II, controlled ([place])    diet controlled,exercise, no meds  . Diverticulitis 2012   Treated medically  . Gastroesophageal reflux   . Hyperlipidemia   . Nephrolithiasis   . Tick bite    [place] Tick, alpha gal positive, makes her allergic to meat   Past Surgical History:  Procedure Laterality Date  . BREAST LUMPECTOMY     Left  . CESAREAN SECTION    . CHOLECYSTECTOMY    . COLONOSCOPY  2004  . DILATION AND CURETTAGE OF UTERUS    . EUS N/A 09/07/2014   Procedure: UPPER ENDOSCOPIC ULTRASOUND (EUS) LINEAR;  Surgeon: Milus Banister, MD;  Location: [facility];  Service: Endoscopy;  Laterality: N/A;  . NEPHROLITHOTOMY    . TUBAL LIGATION    . WHIPPLE PROCEDURE     Social History   Social History  . Marital status: Divorced    Spouse name: N/A  . Number of children: 2  . Years of education: N/A   Occupational History  . dancer for fun    Social History Main Topics  . Smoking status: Never Smoker  . Smokeless tobacco: Never Used  . Alcohol use No  . Drug use: No  . Sexual activity: Not Asked   Other Topics Concern  . None   Social History Narrative  . None   Outpatient Encounter Prescriptions as of 02/05/2017  Medication Sig  . insulin lispro (HUMALOG KWIKPEN) 100 UNIT/ML KiwkPen Inject 6-12 Units into the skin 3 (three) times daily.  Marland Kitchen albuterol (PROVENTIL HFA;VENTOLIN HFA) 108 (90 BASE) MCG/ACT inhaler Inhale 2 puffs into the lungs every 6 (six) hours as needed for wheezing.  . Blood Glucose Monitoring  Suppl (ONETOUCH VERIO) w/Device KIT 1 each by Does not apply route as needed.  . Continuous Blood Gluc Sensor (FREESTYLE LIBRE SENSOR SYSTEM) MISC Use one sensor every 10 days.  Marland Kitchen dicyclomine (BENTYL) 10 MG capsule Take 1 tablet once or twice daily as needed for abdominal pain and gas.  . docusate sodium (COLACE) 100 MG capsule Take 100 mg by mouth daily.  . fluticasone furoate-vilanterol (BREO ELLIPTA) 200-25 MCG/INH AEPB Inhale 1 puff into the lungs daily.  Marland Kitchen glucose blood (FORA BLOOD GLUCOSE TEST) test strip Use as instructed  . glycerin adult 2 g suppository Place 1 suppository rectally as needed for constipation.  . Insulin Glargine (TOUJEO MAX SOLOSTAR) 300 UNIT/ML SOPN Inject 12 Units into the skin at bedtime.  . Insulin Pen Needle (BD PEN NEEDLE NANO U/F) 32G X 4 MM MISC 1 each by Does not apply route 4 (four) times daily.  Marland Kitchen omeprazole (PRILOSEC) 20 MG capsule Take 1 capsule (20 mg total) by mouth daily.  . Pancrelipase, Lip-Prot-Amyl, (CREON) 24000-76000 units CPEP Use 1 capsule with meals and snacks upto 6 a day  . Potassium Chloride (KLOR-CON 10 PO) Take by mouth 2 (two) times daily.  . psyllium (METAMUCIL SMOOTH TEXTURE) 28 % packet Take 1 packet by mouth 2 (two) times  daily.  . valACYclovir (VALTREX) 500 MG tablet Take 500 mg by mouth daily as needed (outbreak).   . [DISCONTINUED] glucose blood (ONETOUCH VERIO) test strip Use as instructed  . [DISCONTINUED] insulin aspart (NOVOLOG FLEXPEN) 100 UNIT/ML FlexPen Inject 4-10 Units into the skin 3 (three) times daily with meals.   No facility-administered encounter medications on file as of 02/05/2017.     ALLERGIES: Allergies  Allergen Reactions  . Biaxin [Clarithromycin] Other (See Comments)    Hallucinations  . Carafate [Sucralfate] Other (See Comments)    Flared up her vertigo, run her blood sugar up and caused constipation  . Codeine Other (See Comments)    Hallucinations   . Doxycycline Other (See Comments)    GI upset ,  turned stool orange  . Metronidazole Other (See Comments)    Heart Palpations -flagyl  . Cephalosporins Hives and Palpitations    VACCINATION STATUS:  There is no immunization history on file for this patient.  Diabetes  She presents for her follow-up diabetic visit. Diabetes type: Diabetes is due to pancreatectomy. Onset time: She was diagnosed at approximate age of 76. Her disease course has been improving. There are no hypoglycemic associated symptoms. Pertinent negatives for hypoglycemia include no confusion, headaches, pallor or seizures. Associated symptoms include blurred vision, fatigue, polydipsia and polyuria. Pertinent negatives for diabetes include no chest pain and no polyphagia. There are no hypoglycemic complications. Symptoms are improving. There are no diabetic complications. Risk factors for coronary artery disease include diabetes mellitus, post-menopausal, sedentary lifestyle and family history. Current diabetic treatment includes oral agent (dual therapy) (She is taking Synjardy 01/999 g by mouth daily.). Her weight is increasing steadily. She is following a generally unhealthy diet. When asked about meal planning, she reported none. She has had a previous visit with a dietitian. She never participates in exercise. Her breakfast blood glucose range is generally 140-180 mg/dl. Her lunch blood glucose range is generally >200 mg/dl. Her dinner blood glucose range is generally >200 mg/dl. Her bedtime blood glucose range is generally >200 mg/dl. Her overall blood glucose range is >200 mg/dl. An ACE inhibitor/angiotensin II receptor blocker is not being taken. She does not see a podiatrist.Eye exam is not current.     Review of Systems  Constitutional: Positive for fatigue. Negative for chills, fever and unexpected weight change.  HENT: Negative for trouble swallowing and voice change.   Eyes: Positive for blurred vision. Negative for visual disturbance.  Respiratory: Negative for  cough, shortness of breath and wheezing.   Cardiovascular: Negative for chest pain, palpitations and leg swelling.  Gastrointestinal: Positive for diarrhea. Negative for nausea and vomiting.  Endocrine: Positive for polydipsia and polyuria. Negative for cold intolerance, heat intolerance and polyphagia.  Musculoskeletal: Negative for arthralgias and myalgias.  Skin: Negative for color change, pallor, rash and wound.  Neurological: Negative for seizures and headaches.  Psychiatric/Behavioral: Negative for confusion and suicidal ideas.    Objective:    BP 115/66   Pulse 69   Ht 4' 11.5" (1.511 m)   Wt 117 lb (53.1 kg)   BMI 23.24 kg/m   Wt Readings from Last 3 Encounters:  02/05/17 117 lb (53.1 kg)  01/29/17 111 lb (50.3 kg)  01/22/17 109 lb (49.4 kg)     Physical Exam  Constitutional: She is oriented to person, place, and time. She appears well-developed.  HENT:  Head: Normocephalic and atraumatic.  Eyes: EOM are normal.  Neck: Normal range of motion. Neck supple. No tracheal deviation present. No  thyromegaly present.  Cardiovascular: Normal rate and regular rhythm.   Pulmonary/Chest: Effort normal and breath sounds normal.  Abdominal: Soft. Bowel sounds are normal. There is no tenderness. There is no guarding.  Musculoskeletal: Normal range of motion. She exhibits no edema.  Neurological: She is alert and oriented to person, place, and time. She has normal reflexes. No cranial nerve deficit. Coordination normal.  Skin: Skin is warm and dry. No rash noted. No erythema. No pallor.  Psychiatric: She has a normal mood and affect. Judgment normal.    Recent Results (from the past 2160 hour(s))  Urinalysis, Routine w reflex microscopic     Status: Abnormal   Collection Time: 01/10/17  6:15 PM  Result Value Ref Range   Color, Urine STRAW (A) YELLOW   APPearance CLEAR CLEAR   Specific Gravity, Urine 1.014 1.005 - 1.030   pH 6.0 5.0 - 8.0   Glucose, UA >=500 (A) NEGATIVE mg/dL    Hgb urine dipstick SMALL (A) NEGATIVE   Bilirubin Urine NEGATIVE NEGATIVE   Ketones, ur NEGATIVE NEGATIVE mg/dL   Protein, ur NEGATIVE NEGATIVE mg/dL   Nitrite NEGATIVE NEGATIVE   Leukocytes, UA NEGATIVE NEGATIVE   RBC / HPF 0-5 0 - 5 RBC/hpf   WBC, UA 0-5 0 - 5 WBC/hpf   Bacteria, UA NONE SEEN NONE SEEN   Squamous Epithelial / LPF NONE SEEN NONE SEEN  CBC with Differential/Platelet     Status: None   Collection Time: 01/10/17  7:25 PM  Result Value Ref Range   WBC 7.9 4.0 - 10.5 K/uL   RBC 4.40 3.87 - 5.11 MIL/uL   Hemoglobin 13.5 12.0 - 15.0 g/dL   HCT 40.3 36.0 - 46.0 %   MCV 91.6 78.0 - 100.0 fL   MCH 30.7 26.0 - 34.0 pg   MCHC 33.5 30.0 - 36.0 g/dL   RDW 13.5 11.5 - 15.5 %   Platelets 197 150 - 400 K/uL   Neutrophils Relative % 59 %   Neutro Abs 4.7 1.7 - 7.7 K/uL   Lymphocytes Relative 31 %   Lymphs Abs 2.5 0.7 - 4.0 K/uL   Monocytes Relative 8 %   Monocytes Absolute 0.6 0.1 - 1.0 K/uL   Eosinophils Relative 2 %   Eosinophils Absolute 0.1 0.0 - 0.7 K/uL   Basophils Relative 0 %   Basophils Absolute 0.0 0.0 - 0.1 K/uL  Comprehensive metabolic panel     Status: Abnormal   Collection Time: 01/10/17  7:25 PM  Result Value Ref Range   Sodium 140 135 - 145 mmol/L   Potassium 2.8 (L) 3.5 - 5.1 mmol/L   Chloride 108 101 - 111 mmol/L   CO2 22 22 - 32 mmol/L   Glucose, Bld 204 (H) 65 - 99 mg/dL   BUN 5 (L) 6 - 20 mg/dL   Creatinine, Ser 0.55 0.44 - 1.00 mg/dL   Calcium 9.1 8.9 - 10.3 mg/dL   Total Protein 6.9 6.5 - 8.1 g/dL   Albumin 3.7 3.5 - 5.0 g/dL   AST 20 15 - 41 U/L   ALT 33 14 - 54 U/L   Alkaline Phosphatase 76 38 - 126 U/L   Total Bilirubin 1.0 0.3 - 1.2 mg/dL   GFR calc non Af Amer >60 >60 mL/min   GFR calc Af Amer >60 >60 mL/min    Comment: (NOTE) The eGFR has been calculated using the CKD EPI equation. This calculation has not been validated in all clinical situations. eGFR's persistently <60 mL/min  signify possible Chronic Kidney Disease.    Anion  gap 10 5 - 15  COMPLETE METABOLIC PANEL WITH GFR     Status: Abnormal   Collection Time: 01/15/17  9:07 AM  Result Value Ref Range   Glucose, Bld 413 (H) 65 - 139 mg/dL    Comment: Verified by repeat analysis. . .        Non-fasting reference interval .    BUN 4 (L) 7 - 25 mg/dL   Creat 0.62 0.60 - 0.93 mg/dL    Comment: For patients >49 years of age, the reference limit for Creatinine is approximately 13% higher for people identified as African-American. .    GFR, Est Non African American 88 > OR = 60 mL/min/1.34m   GFR, Est African American 102 > OR = 60 mL/min/1.782m  BUN/Creatinine Ratio 6 6 - 22 (calc)   Sodium 138 135 - 146 mmol/L   Potassium 3.8 3.5 - 5.3 mmol/L   Chloride 106 98 - 110 mmol/L   CO2 26 20 - 32 mmol/L   Calcium 8.4 (L) 8.6 - 10.4 mg/dL   Total Protein 5.8 (L) 6.1 - 8.1 g/dL   Albumin 3.3 (L) 3.6 - 5.1 g/dL   Globulin 2.5 1.9 - 3.7 g/dL (calc)   AG Ratio 1.3 1.0 - 2.5 (calc)   Total Bilirubin 1.0 0.2 - 1.2 mg/dL   Alkaline phosphatase (APISO) 73 33 - 130 U/L   AST 45 (H) 10 - 35 U/L   ALT 35 (H) 6 - 29 U/L  Hemoglobin A1c     Status: Abnormal   Collection Time: 01/15/17  9:07 AM  Result Value Ref Range   Hgb A1c MFr Bld 9.6 (H) <5.7 % of total Hgb    Comment: For someone without known diabetes, a hemoglobin A1c value of 6.5% or greater indicates that they may have  diabetes and this should be confirmed with a follow-up  test. . For someone with known diabetes, a value <7% indicates  that their diabetes is well controlled and a value  greater than or equal to 7% indicates suboptimal  control. A1c targets should be individualized based on  duration of diabetes, age, comorbid conditions, and  other considerations. . Currently, no consensus exists regarding use of hemoglobin A1c for diagnosis of diabetes for children. .    Mean Plasma Glucose 229 (calc)   eAG (mmol/L) 12.7 (calc)  TSH     Status: None   Collection Time: 01/15/17  9:07 AM   Result Value Ref Range   TSH 0.96 0.40 - 4.50 mIU/L  T4, free     Status: None   Collection Time: 01/15/17  9:07 AM  Result Value Ref Range   Free T4 1.0 0.8 - 1.8 ng/dL  Microalbumin / creatinine urine ratio     Status: None   Collection Time: 01/15/17  9:07 AM  Result Value Ref Range   Creatinine, Urine 63 20 - 275 mg/dL   Microalb, Ur 0.7 mg/dL    Comment: Reference Range Not established    Microalb Creat Ratio 11 <30 mcg/mg creat    Comment: . The ADA defines abnormalities in albumin excretion as follows: . Marland Kitchenategory         Result (mcg/mg creatinine) . Normal                    <30 Microalbuminuria         30-299  Clinical albuminuria   > OR = 300 .  The ADA recommends that at least two of three specimens collected within a 3-6 month period be abnormal before considering a patient to be within a diagnostic category.   Celiac Disease Comprehensive Panel with Reflexes     Status: None   Collection Time: 01/15/17  9:07 AM  Result Value Ref Range   INTERPRETATION      Comment: . No serological evidence of celiac disease. Marland Kitchen tTG IgA may normalize in individuals with celiac disease who maintain a gluten-free diet. Consider HLA DQ2 and  DQ8 testing to rule out celiac disease. Celiac disease  is extremely rare in the absence of DQ2 or DQ8. .    (tTG) Ab, IgA 1 U/mL    Comment: .        Value      Interpretation        -----      --------------        <4         No Antibody Detected        > or = 4   Antibody Detected .    Immunoglobulin A 87 81 - 463 mg/dL    Sep 02, 2016 A1c was 8.1%.   Assessment & Plan:   1. Diabetes mellitus associated with pancreatic disease [facility])  - Patient has currently uncontrolled,  Symptomatic, Pancreatic diabetes since age 35,  with most recent A1c of 9.6 %.  - Her diabetes is due to Whipple  Procedure in September 2016 at [facility] due to mucinous cystic lesions of the pancreatic head- removal of the  pancreatic head- pancreaticoduodenectomy.   - Kerrin Mo Baker remains at a high risk for more acute and chronic complications which include CAD, CVA, CKD, retinopathy, and neuropathy. These are all discussed in detail with the patient.  - I have counseled her on diet management by adopting a carbohydrate restricted/protein rich diet.  -  Suggestion is made for her to avoid simple carbohydrates  from her diet including Cakes, Sweet Desserts / Pastries, Ice Cream, Soda (diet and regular), Sweet Tea, Candies, Chips, Cookies, Store Bought Juices, Alcohol in Excess of  1-2 drinks a day, Artificial Sweeteners, and "Sugar-free" Products. This will help patient to have stable blood glucose profile and potentially avoid unintended weight gain.   - I encouraged her to switch to  unprocessed or minimally processed complex starch and increased protein intake (animal or plant source), fruits, and vegetables.  - she is advised to stick to a routine mealtimes to eat 3 meals  a day and avoid unnecessary snacks ( to snack only to correct hypoglycemia).    - I have approached her with the following individualized plan to manage diabetes and patient agrees:   -  She came with controlled fasting blood glucose profile, however, still significantly above target postprandial blood glucose profile.  - Given her history of pancreatectomy, and her current presentation with severe postprandial hyperglycemia, she would continue to need basal/bolus insulin and she agrees. - She will continue on Toujeo 12 units daily at bedtime, increase Humalog to 6  units 3 times a day before meals for pre-meal blood glucose of 90 mg/dL (with additional dose for readings above 150 mg/dL) associated with strict monitoring of blood glucose 4 times a day-before meals and at bedtime and return in one week with her meter and logs for dose adjustment.  - Proper insulin use technique was demonstrated  again by myself and my nurse in the exam  room.  -Patient is encouraged to call clinic for blood glucose levels less than 70 or above 300 mg /dl. - She will benefit from continued glucose monitoring. I have discussed and initiated a prescription for that free style Libre device for her.  - Insulin is exclusive choice of therapy for her.  - she is not a candidate for metformin, SGLT2 inhibitors, nor incretin therapy .  - Patient specific target  A1c;  LDL, HDL, Triglycerides, and  Waist Circumference were discussed in detail.  2) BP/HTN:  Controlled.  3) Lipids/HPL:   Lipid panel unknown.  She will have fasting lipid panel on subsequent visits. 4)  exocrine pancreatic insufficiency: She  is benefiting from Creon therapy. I advised her to continue  Creon 24,000 units, 1 with Meals and Snacks, she reports symptomatic improvement with this medication. - Her celiac screen is negative, I advised her to follow gluten-regular diet.  5) Chronic Care/Health Maintenance:  -she  is not  on ACEI/ARB and Statin medications and  is encouraged to continue to follow up with Ophthalmology, Dentist,  Podiatrist at least yearly or according to recommendations, and advised to   stay away from smoking. I have recommended yearly flu vaccine and pneumonia vaccination at least every 5 years, and  sleep for at least 7 hours a day.  - Time spent with the patient: 25 min, of which >50% was spent in reviewing her sugar logs , discussing her hypo- and hyper-glycemic episodes, reviewing her current and  previous labs and insulin doses and developing a plan to avoid hypo- and hyper-glycemia.   - I advised patient to maintain close follow up with Sinda Du, MD for primary care needs.  Follow up plan: - Return in about 2 weeks (around 02/19/2017) for follow up with meter and logs- no labs.  Glade Lloyd, MD Phone: [phone]  Fax: [phone]   02/05/2017, 12:05 PM This note was partially dictated with voice recognition software. Similar sounding words  can be transcribed inadequately or may not  be corrected upon review.

## 2017-02-12 DIAGNOSIS — Z23 Encounter for immunization: Secondary | ICD-10-CM | POA: Diagnosis not present

## 2017-02-19 ENCOUNTER — Ambulatory Visit (INDEPENDENT_AMBULATORY_CARE_PROVIDER_SITE_OTHER): Payer: Medicare Other | Admitting: "Endocrinology

## 2017-02-19 ENCOUNTER — Encounter: Payer: Medicare Other | Attending: Pulmonary Disease | Admitting: Nutrition

## 2017-02-19 ENCOUNTER — Encounter: Payer: Self-pay | Admitting: "Endocrinology

## 2017-02-19 VITALS — BP 121/69 | HR 70 | Ht 59.5 in | Wt 124.0 lb

## 2017-02-19 VITALS — Wt 124.0 lb

## 2017-02-19 DIAGNOSIS — Z713 Dietary counseling and surveillance: Secondary | ICD-10-CM | POA: Diagnosis not present

## 2017-02-19 DIAGNOSIS — E118 Type 2 diabetes mellitus with unspecified complications: Secondary | ICD-10-CM | POA: Diagnosis not present

## 2017-02-19 DIAGNOSIS — E108 Type 1 diabetes mellitus with unspecified complications: Secondary | ICD-10-CM

## 2017-02-19 DIAGNOSIS — IMO0002 Reserved for concepts with insufficient information to code with codable children: Secondary | ICD-10-CM

## 2017-02-19 DIAGNOSIS — E1165 Type 2 diabetes mellitus with hyperglycemia: Secondary | ICD-10-CM | POA: Insufficient documentation

## 2017-02-19 DIAGNOSIS — E1065 Type 1 diabetes mellitus with hyperglycemia: Secondary | ICD-10-CM

## 2017-02-19 DIAGNOSIS — K8681 Exocrine pancreatic insufficiency: Secondary | ICD-10-CM | POA: Diagnosis not present

## 2017-02-19 DIAGNOSIS — K869 Disease of pancreas, unspecified: Secondary | ICD-10-CM | POA: Diagnosis not present

## 2017-02-19 DIAGNOSIS — E1169 Type 2 diabetes mellitus with other specified complication: Secondary | ICD-10-CM | POA: Diagnosis not present

## 2017-02-19 NOTE — Progress Notes (Signed)
Subjective:    Patient ID: Betty Nguyen, female    DOB: 02/27/1941.  she is being seen in f/u for management of currently uncontrolled symptomatic diabetes requested by  Sinda Du, MD.   Past Medical History:  Diagnosis Date  . Anxiety   . Anxiety and depression   . Asthma   . Breast carcinoma ([place]) 08/2009   Invasive ductal; left; lumpectomy and sentinel node excision- oncology visits yearly.  . Chest pain   . Depression   . Diabetes mellitus type II, controlled ([place])    diet controlled,exercise, no meds  . Diverticulitis 2012   Treated medically  . Gastroesophageal reflux   . Hyperlipidemia   . Nephrolithiasis   . Tick bite    [place] Tick, alpha gal positive, makes her allergic to meat   Past Surgical History:  Procedure Laterality Date  . BREAST LUMPECTOMY     Left  . CESAREAN SECTION    . CHOLECYSTECTOMY    . COLONOSCOPY  2004  . DILATION AND CURETTAGE OF UTERUS    . EUS N/A 09/07/2014   Procedure: UPPER ENDOSCOPIC ULTRASOUND (EUS) LINEAR;  Surgeon: Milus Banister, MD;  Location: [facility];  Service: Endoscopy;  Laterality: N/A;  . NEPHROLITHOTOMY    . TUBAL LIGATION    . WHIPPLE PROCEDURE     Social History   Social History  . Marital status: Divorced    Spouse name: N/A  . Number of children: 2  . Years of education: N/A   Occupational History  . dancer for fun    Social History Main Topics  . Smoking status: Never Smoker  . Smokeless tobacco: Never Used  . Alcohol use No  . Drug use: No  . Sexual activity: Not Asked   Other Topics Concern  . None   Social History Narrative  . None   Outpatient Encounter Prescriptions as of 02/19/2017  Medication Sig  . albuterol (PROVENTIL HFA;VENTOLIN HFA) 108 (90 BASE) MCG/ACT inhaler Inhale 2 puffs into the lungs every 6 (six) hours as needed for wheezing.  . Blood Glucose Monitoring Suppl (ONETOUCH VERIO) w/Device KIT 1 each by Does not apply route as needed.  . Continuous Blood Gluc Sensor  (FREESTYLE LIBRE SENSOR SYSTEM) MISC Use one sensor every 10 days.  Marland Kitchen dicyclomine (BENTYL) 10 MG capsule Take 1 tablet once or twice daily as needed for abdominal pain and gas.  . docusate sodium (COLACE) 100 MG capsule Take 100 mg by mouth daily.  . fluticasone furoate-vilanterol (BREO ELLIPTA) 200-25 MCG/INH AEPB Inhale 1 puff into the lungs daily.  Marland Kitchen glucose blood (FORA BLOOD GLUCOSE TEST) test strip Use as instructed  . glycerin adult 2 g suppository Place 1 suppository rectally as needed for constipation.  . Insulin Glargine (TOUJEO MAX SOLOSTAR) 300 UNIT/ML SOPN Inject 12 Units into the skin at bedtime.  . insulin lispro (HUMALOG KWIKPEN) 100 UNIT/ML KiwkPen Inject 6-12 Units into the skin 3 (three) times daily.  . Insulin Pen Needle (BD PEN NEEDLE NANO U/F) 32G X 4 MM MISC 1 each by Does not apply route 4 (four) times daily.  Marland Kitchen omeprazole (PRILOSEC) 20 MG capsule Take 1 capsule (20 mg total) by mouth daily.  . Pancrelipase, Lip-Prot-Amyl, (CREON) 24000-76000 units CPEP Use 1 capsule with meals and snacks upto 6 a day  . Potassium Chloride (KLOR-CON 10 PO) Take by mouth 2 (two) times daily.  . psyllium (METAMUCIL SMOOTH TEXTURE) 28 % packet Take 1 packet by mouth 2 (two) times  daily.  . valACYclovir (VALTREX) 500 MG tablet Take 500 mg by mouth daily as needed (outbreak).    No facility-administered encounter medications on file as of 02/19/2017.     ALLERGIES: Allergies  Allergen Reactions  . Biaxin [Clarithromycin] Other (See Comments)    Hallucinations  . Carafate [Sucralfate] Other (See Comments)    Flared up her vertigo, run her blood sugar up and caused constipation  . Codeine Other (See Comments)    Hallucinations   . Doxycycline Other (See Comments)    GI upset , turned stool orange  . Metronidazole Other (See Comments)    Heart Palpations -flagyl  . Cephalosporins Hives and Palpitations    VACCINATION STATUS:  There is no immunization history on file for this  patient.  Diabetes  She presents for her follow-up diabetic visit. Diabetes type: Diabetes is due to pancreatectomy. Onset time: She was diagnosed at approximate age of 9. Her disease course has been improving. There are no hypoglycemic associated symptoms. Pertinent negatives for hypoglycemia include no confusion, headaches, pallor or seizures. Associated symptoms include blurred vision and fatigue. Pertinent negatives for diabetes include no chest pain, no polydipsia, no polyphagia and no polyuria. There are no hypoglycemic complications. Symptoms are improving. There are no diabetic complications. Risk factors for coronary artery disease include diabetes mellitus, post-menopausal, sedentary lifestyle and family history. Current diabetic treatment includes intensive insulin program. Her weight is increasing steadily. She is following a generally unhealthy diet. When asked about meal planning, she reported none. She has had a previous visit with a dietitian. She never participates in exercise. Her breakfast blood glucose range is generally 140-180 mg/dl. Her lunch blood glucose range is generally 180-200 mg/dl. Her dinner blood glucose range is generally 180-200 mg/dl. Her bedtime blood glucose range is generally 180-200 mg/dl. Her overall blood glucose range is 180-200 mg/dl. An ACE inhibitor/angiotensin II receptor blocker is not being taken. She does not see a podiatrist.Eye exam is not current.    Review of Systems  Constitutional: Positive for fatigue. Negative for chills, fever and unexpected weight change.  HENT: Negative for trouble swallowing and voice change.   Eyes: Positive for blurred vision. Negative for visual disturbance.  Respiratory: Negative for cough, shortness of breath and wheezing.   Cardiovascular: Negative for chest pain, palpitations and leg swelling.  Gastrointestinal: Positive for diarrhea. Negative for nausea and vomiting.  Endocrine: Negative for cold intolerance, heat  intolerance, polydipsia, polyphagia and polyuria.  Musculoskeletal: Negative for arthralgias and myalgias.  Skin: Negative for color change, pallor, rash and wound.  Neurological: Negative for seizures and headaches.  Psychiatric/Behavioral: Negative for confusion and suicidal ideas.    Objective:    BP 121/69   Pulse 70   Ht 4' 11.5" (1.511 m)   Wt 124 lb (56.2 kg)   BMI 24.63 kg/m   Wt Readings from Last 3 Encounters:  02/19/17 124 lb (56.2 kg)  02/05/17 117 lb (53.1 kg)  01/29/17 111 lb (50.3 kg)     Physical Exam  Constitutional: She is oriented to person, place, and time. She appears well-developed.  HENT:  Head: Normocephalic and atraumatic.  Eyes: EOM are normal.  Neck: Normal range of motion. Neck supple. No tracheal deviation present. No thyromegaly present.  Cardiovascular: Normal rate and regular rhythm.   Pulmonary/Chest: Effort normal and breath sounds normal.  Abdominal: Soft. Bowel sounds are normal. There is no tenderness. There is no guarding.  Musculoskeletal: Normal range of motion. She exhibits no edema.  Neurological: She  is alert and oriented to person, place, and time. She has normal reflexes. No cranial nerve deficit. Coordination normal.  Skin: Skin is warm and dry. No rash noted. No erythema. No pallor.  Psychiatric: She has a normal mood and affect. Judgment normal.    Recent Results (from the past 2160 hour(s))  Urinalysis, Routine w reflex microscopic     Status: Abnormal   Collection Time: 01/10/17  6:15 PM  Result Value Ref Range   Color, Urine STRAW (A) YELLOW   APPearance CLEAR CLEAR   Specific Gravity, Urine 1.014 1.005 - 1.030   pH 6.0 5.0 - 8.0   Glucose, UA >=500 (A) NEGATIVE mg/dL   Hgb urine dipstick SMALL (A) NEGATIVE   Bilirubin Urine NEGATIVE NEGATIVE   Ketones, ur NEGATIVE NEGATIVE mg/dL   Protein, ur NEGATIVE NEGATIVE mg/dL   Nitrite NEGATIVE NEGATIVE   Leukocytes, UA NEGATIVE NEGATIVE   RBC / HPF 0-5 0 - 5 RBC/hpf   WBC,  UA 0-5 0 - 5 WBC/hpf   Bacteria, UA NONE SEEN NONE SEEN   Squamous Epithelial / LPF NONE SEEN NONE SEEN  CBC with Differential/Platelet     Status: None   Collection Time: 01/10/17  7:25 PM  Result Value Ref Range   WBC 7.9 4.0 - 10.5 K/uL   RBC 4.40 3.87 - 5.11 MIL/uL   Hemoglobin 13.5 12.0 - 15.0 g/dL   HCT 40.3 36.0 - 46.0 %   MCV 91.6 78.0 - 100.0 fL   MCH 30.7 26.0 - 34.0 pg   MCHC 33.5 30.0 - 36.0 g/dL   RDW 13.5 11.5 - 15.5 %   Platelets 197 150 - 400 K/uL   Neutrophils Relative % 59 %   Neutro Abs 4.7 1.7 - 7.7 K/uL   Lymphocytes Relative 31 %   Lymphs Abs 2.5 0.7 - 4.0 K/uL   Monocytes Relative 8 %   Monocytes Absolute 0.6 0.1 - 1.0 K/uL   Eosinophils Relative 2 %   Eosinophils Absolute 0.1 0.0 - 0.7 K/uL   Basophils Relative 0 %   Basophils Absolute 0.0 0.0 - 0.1 K/uL  Comprehensive metabolic panel     Status: Abnormal   Collection Time: 01/10/17  7:25 PM  Result Value Ref Range   Sodium 140 135 - 145 mmol/L   Potassium 2.8 (L) 3.5 - 5.1 mmol/L   Chloride 108 101 - 111 mmol/L   CO2 22 22 - 32 mmol/L   Glucose, Bld 204 (H) 65 - 99 mg/dL   BUN 5 (L) 6 - 20 mg/dL   Creatinine, Ser 0.55 0.44 - 1.00 mg/dL   Calcium 9.1 8.9 - 10.3 mg/dL   Total Protein 6.9 6.5 - 8.1 g/dL   Albumin 3.7 3.5 - 5.0 g/dL   AST 20 15 - 41 U/L   ALT 33 14 - 54 U/L   Alkaline Phosphatase 76 38 - 126 U/L   Total Bilirubin 1.0 0.3 - 1.2 mg/dL   GFR calc non Af Amer >60 >60 mL/min   GFR calc Af Amer >60 >60 mL/min    Comment: (NOTE) The eGFR has been calculated using the CKD EPI equation. This calculation has not been validated in all clinical situations. eGFR's persistently <60 mL/min signify possible Chronic Kidney Disease.    Anion gap 10 5 - 15  COMPLETE METABOLIC PANEL WITH GFR     Status: Abnormal   Collection Time: 01/15/17  9:07 AM  Result Value Ref Range   Glucose, Bld 413 (H) 65 -  139 mg/dL    Comment: Verified by repeat analysis. . .        Non-fasting reference  interval .    BUN 4 (L) 7 - 25 mg/dL   Creat 0.62 0.60 - 0.93 mg/dL    Comment: For patients >58 years of age, the reference limit for Creatinine is approximately 13% higher for people identified as African-American. .    GFR, Est Non African American 88 > OR = 60 mL/min/1.56m   GFR, Est African American 102 > OR = 60 mL/min/1.73m  BUN/Creatinine Ratio 6 6 - 22 (calc)   Sodium 138 135 - 146 mmol/L   Potassium 3.8 3.5 - 5.3 mmol/L   Chloride 106 98 - 110 mmol/L   CO2 26 20 - 32 mmol/L   Calcium 8.4 (L) 8.6 - 10.4 mg/dL   Total Protein 5.8 (L) 6.1 - 8.1 g/dL   Albumin 3.3 (L) 3.6 - 5.1 g/dL   Globulin 2.5 1.9 - 3.7 g/dL (calc)   AG Ratio 1.3 1.0 - 2.5 (calc)   Total Bilirubin 1.0 0.2 - 1.2 mg/dL   Alkaline phosphatase (APISO) 73 33 - 130 U/L   AST 45 (H) 10 - 35 U/L   ALT 35 (H) 6 - 29 U/L  Hemoglobin A1c     Status: Abnormal   Collection Time: 01/15/17  9:07 AM  Result Value Ref Range   Hgb A1c MFr Bld 9.6 (H) <5.7 % of total Hgb    Comment: For someone without known diabetes, a hemoglobin A1c value of 6.5% or greater indicates that they may have  diabetes and this should be confirmed with a follow-up  test. . For someone with known diabetes, a value <7% indicates  that their diabetes is well controlled and a value  greater than or equal to 7% indicates suboptimal  control. A1c targets should be individualized based on  duration of diabetes, age, comorbid conditions, and  other considerations. . Currently, no consensus exists regarding use of hemoglobin A1c for diagnosis of diabetes for children. .    Mean Plasma Glucose 229 (calc)   eAG (mmol/L) 12.7 (calc)  TSH     Status: None   Collection Time: 01/15/17  9:07 AM  Result Value Ref Range   TSH 0.96 0.40 - 4.50 mIU/L  T4, free     Status: None   Collection Time: 01/15/17  9:07 AM  Result Value Ref Range   Free T4 1.0 0.8 - 1.8 ng/dL  Microalbumin / creatinine urine ratio     Status: None   Collection Time:  01/15/17  9:07 AM  Result Value Ref Range   Creatinine, Urine 63 20 - 275 mg/dL   Microalb, Ur 0.7 mg/dL    Comment: Reference Range Not established    Microalb Creat Ratio 11 <30 mcg/mg creat    Comment: . The ADA defines abnormalities in albumin excretion as follows: . Marland Kitchenategory         Result (mcg/mg creatinine) . Normal                    <30 Microalbuminuria         30-299  Clinical albuminuria   > OR = 300 . The ADA recommends that at least two of three specimens collected within a 3-6 month period be abnormal before considering a patient to be within a diagnostic category.   Celiac Disease Comprehensive Panel with Reflexes     Status: None   Collection Time:  01/15/17  9:07 AM  Result Value Ref Range   INTERPRETATION      Comment: . No serological evidence of celiac disease. Marland Kitchen tTG IgA may normalize in individuals with celiac disease who maintain a gluten-free diet. Consider HLA DQ2 and  DQ8 testing to rule out celiac disease. Celiac disease  is extremely rare in the absence of DQ2 or DQ8. .    (tTG) Ab, IgA 1 U/mL    Comment: .        Value      Interpretation        -----      --------------        <4         No Antibody Detected        > or = 4   Antibody Detected .    Immunoglobulin A 87 81 - 463 mg/dL    Sep 02, 2016 A1c was 8.1%.   Assessment & Plan:   1. Diabetes mellitus associated with pancreatic disease [facility])  - Patient has currently uncontrolled,  Symptomatic, Pancreatic diabetes since age 89,  with most recent A1c of 9.6 %.  - Her diabetes is due to Whipple  Procedure in September 2016 at [facility] due to mucinous cystic lesions of the pancreatic head- removal of the pancreatic head- pancreaticoduodenectomy.  - Kerrin Mo Baker remains at a high risk for more acute and chronic complications which include CAD, CVA, CKD, retinopathy, and neuropathy. These are all discussed in detail with the patient.  - I have counseled her  on diet management by adopting a carbohydrate restricted/protein rich diet.  -  Suggestion is made for her to avoid simple carbohydrates  from her diet including Cakes, Sweet Desserts / Pastries, Ice Cream, Soda (diet and regular), Sweet Tea, Candies, Chips, Cookies, Store Bought Juices, Alcohol in Excess of  1-2 drinks a day, Artificial Sweeteners, and "Sugar-free" Products. This will help patient to have stable blood glucose profile and potentially avoid unintended weight gain.    - I encouraged her to switch to  unprocessed or minimally processed complex starch and increased protein intake (animal or plant source), fruits, and vegetables.  - she is advised to stick to a routine mealtimes to eat 3 meals  a day and avoid unnecessary snacks ( to snack only to correct hypoglycemia).    - I have approached her with the following individualized plan to manage diabetes and patient agrees:   -  She came with controlled fasting blood glucose profile, however, still significantly above target postprandial blood glucose profile.  - He mainly due to her decision to keep Humalog at 4 units 3 times a day before meals instead of 6 units 3 times a day 3. - Given her history of pancreatectomy, and her current presentation with  postprandial hyperglycemia, she will need basal/bolus insulin and slightly higher dose of Humalog.  she agrees. - She will continue on Toujeo 12 units daily at bedtime,  Resume  Humalog  6  units 3 times a day before meals for pre-meal blood glucose of 90 mg/dL (with additional dose for readings above 150 mg/dL) associated with strict monitoring of blood glucose 4 times a day-before meals and at bedtime .  -She is now more comfortable using insulin.   -Patient is encouraged to call clinic for blood glucose levels less than 70 or above 300 mg /dl. - She will benefit from continued glucose monitoring. I have discussed and initiated a prescription  for  free style Libre device for her- she  is awaiting for decision.   - Insulin is exclusive choice of therapy for her.  - she is not a candidate for metformin, SGLT2 inhibitors, nor incretin therapy .  - Patient specific target  A1c;  LDL, HDL, Triglycerides, and  Waist Circumference were discussed in detail.  2) BP/HTN:  Controlled.  3) Lipids/HPL:   Lipid panel unknown.  She will have fasting lipid panel on subsequent visits. 4)  exocrine pancreatic insufficiency: She  is benefiting from Creon therapy. I advised her to continue  Creon 24,000 units, 1 with Meals and Snacks, she reports symptomatic improvement with this medication. - Her celiac screen is negative, I advised her to follow gluten-regular diet.  5) Chronic Care/Health Maintenance:  -she  is not  on ACEI/ARB and Statin medications and  is encouraged to continue to follow up with Ophthalmology, Dentist,  Podiatrist at least yearly or according to recommendations, and advised to   stay away from smoking. I have recommended yearly flu vaccine and pneumonia vaccination at least every 5 years, and  sleep for at least 7 hours a day.  - Time spent with the patient: 25 min, of which >50% was spent in reviewing her sugar logs , discussing her hypo- and hyper-glycemic episodes, reviewing her current and  previous labs and insulin doses and developing a plan to avoid hypo- and hyper-glycemia.    - I advised patient to maintain close follow up with Sinda Du, MD for primary care needs.  Follow up plan: - Return in about 9 weeks (around 04/23/2017) for meter, and logs.  Glade Lloyd, MD Phone: [phone]  Fax: [phone]   02/19/2017, 10:51 AM This note was partially dictated with voice recognition software. Similar sounding words can be transcribed inadequately or may not  be corrected upon review.

## 2017-02-19 NOTE — Progress Notes (Signed)
  Medical Nutrition Therapy:  Appt start time: 0930 end time:  0945   Assessment:  Primary concerns today: Diabetes Type 1-due to pancreatic cancer.. She sees Dr. Dorris Fetch, Endocrinology. Cooks most of her own meals. Lives with her husband. PMH:  Whipple procedure due to tumor on pancreas and now has to take insulin to manage her blood sugars,  H/o breast cancer. Toujeo 12 units a day, Humalog 6+ units with meals plus sliding scale coverage. She is eating three meals per day. Fearful of low blood sugars. Just stated on insulin.  Diet is fairly well balanced. Good quality foods. She is motivated to eat well to manage her Dm and prevent low bs.  Lab Results  Component Value Date   HGBA1C 9.6 (H) 01/15/2017     Preferred Learning Style:    No preference indicated   Learning Readiness:     Ready  Change in progress   MEDICATIONS:   DIETARY INTAKE:  Eats 3 meals per day. Drinking water.  Doesn't usuallly snack..  Usual physical activity: ADL  Estimated energy needs: 1600  calories 180  g carbohydrates 120 g protein 44 g fat  Progress Towards Goal(s):  In progress.   Nutritional Diagnosis:  NB-1.1 Food and nutrition-related knowledge deficit As related to Diabetes.  As evidenced by A1C 9.6% secondary to pancreatic cancer.    Intervention:  Nutrition and Diabetes education provided on My Plate, CHO counting, meal planning, portion sizes, timing of meals, avoiding snacks between meals unless having a low blood sugar, target ranges for A1C and blood sugars, signs/symptoms and treatment of hyper/hypoglycemia, monitoring blood sugars, taking medications as prescribed, benefits of exercising 30 minutes per day and prevention of complications of DM.  Goals Follow My Plate Eat 2-3 carb choices per meal Increase protein with all meals Avoid low blood sugars Take insulin as prescribed. Do not take insulin if you don't eat a meal Call Dr. Dorris Fetch if BS are less than 70 twice or >  300 2-3 times in a row Keep emergency glucose tabs or other concentrated sweets with you in case of low blood sugars Test blood sugars 4 times per day.  Teaching Method Utilized:  Visual Auditory Hands on  Handouts given during visit include:  The Plate Method  Meal Plan Card  Diabetes Instructions.   Barriers to learning/adherence to lifestyle change: none  Demonstrated degree of understanding via:  Teach Back   Monitoring/Evaluation:  Dietary intake, exercise, meal planning, SBG, and body weight in 1 month(s).

## 2017-02-26 NOTE — Patient Instructions (Addendum)
Goals Follow My Plate Eat 2-3 carb choices per meal Increase protein with all meals Avoid low blood sugars Take insulin as prescribed. Do not take insulin if you don't eat a meal Call Dr. Dorris Fetch if BS are less than 70 twice or > 300 2-3 times in a row Keep emergency glucose tabs or other concentrated sweets with you in case of low blood sugars Test blood sugars 4 times per day.

## 2017-03-04 ENCOUNTER — Ambulatory Visit: Payer: Medicare Other | Admitting: Nutrition

## 2017-03-10 DIAGNOSIS — E119 Type 2 diabetes mellitus without complications: Secondary | ICD-10-CM | POA: Diagnosis not present

## 2017-03-10 DIAGNOSIS — Z859 Personal history of malignant neoplasm, unspecified: Secondary | ICD-10-CM | POA: Diagnosis not present

## 2017-03-10 DIAGNOSIS — K862 Cyst of pancreas: Secondary | ICD-10-CM | POA: Diagnosis not present

## 2017-03-10 DIAGNOSIS — Z90411 Acquired partial absence of pancreas: Secondary | ICD-10-CM | POA: Diagnosis not present

## 2017-03-10 DIAGNOSIS — K8689 Other specified diseases of pancreas: Secondary | ICD-10-CM | POA: Diagnosis not present

## 2017-03-10 DIAGNOSIS — K432 Incisional hernia without obstruction or gangrene: Secondary | ICD-10-CM | POA: Diagnosis not present

## 2017-04-17 DIAGNOSIS — K869 Disease of pancreas, unspecified: Secondary | ICD-10-CM | POA: Diagnosis not present

## 2017-04-17 DIAGNOSIS — E1169 Type 2 diabetes mellitus with other specified complication: Secondary | ICD-10-CM | POA: Diagnosis not present

## 2017-04-18 LAB — COMPLETE METABOLIC PANEL WITH GFR
AG RATIO: 1.3 (calc) (ref 1.0–2.5)
ALKALINE PHOSPHATASE (APISO): 82 U/L (ref 33–130)
ALT: 31 U/L — ABNORMAL HIGH (ref 6–29)
AST: 15 U/L (ref 10–35)
Albumin: 3.7 g/dL (ref 3.6–5.1)
BUN: 17 mg/dL (ref 7–25)
CO2: 29 mmol/L (ref 20–32)
CREATININE: 0.69 mg/dL (ref 0.60–0.93)
Calcium: 9.3 mg/dL (ref 8.6–10.4)
Chloride: 105 mmol/L (ref 98–110)
GFR, Est African American: 98 mL/min/{1.73_m2} (ref >=60)
GFR, Est Non African American: 85 mL/min/{1.73_m2} (ref >=60)
GLOBULIN: 2.9 g/dL (ref 1.9–3.7)
Glucose, Bld: 187 mg/dL — ABNORMAL HIGH (ref 65–99)
POTASSIUM: 4.5 mmol/L (ref 3.5–5.3)
SODIUM: 140 mmol/L (ref 135–146)
Total Bilirubin: 1.3 mg/dL — ABNORMAL HIGH (ref 0.2–1.2)
Total Protein: 6.6 g/dL (ref 6.1–8.1)

## 2017-04-18 LAB — HEMOGLOBIN A1C
EAG (MMOL/L): 10.1 (calc)
Hgb A1c MFr Bld: 8 % of total Hgb — ABNORMAL HIGH (ref <5.7)
Mean Plasma Glucose: 183 (calc)

## 2017-04-18 LAB — LIPID PANEL
CHOLESTEROL: 140 mg/dL (ref <200)
HDL: 69 mg/dL (ref >50)
LDL Cholesterol (Calc): 58 mg/dL (calc)
Non-HDL Cholesterol (Calc): 71 mg/dL (calc) (ref <130)
Total CHOL/HDL Ratio: 2 (calc) (ref <5.0)
Triglycerides: 58 mg/dL (ref <150)

## 2017-04-23 ENCOUNTER — Ambulatory Visit (INDEPENDENT_AMBULATORY_CARE_PROVIDER_SITE_OTHER): Payer: Medicare Other | Admitting: "Endocrinology

## 2017-04-23 ENCOUNTER — Encounter: Payer: Medicare Other | Attending: Pulmonary Disease | Admitting: Nutrition

## 2017-04-23 ENCOUNTER — Encounter: Payer: Self-pay | Admitting: Nutrition

## 2017-04-23 ENCOUNTER — Encounter: Payer: Self-pay | Admitting: "Endocrinology

## 2017-04-23 VITALS — Ht 61.0 in | Wt 139.0 lb

## 2017-04-23 VITALS — BP 131/73 | HR 76 | Ht 59.5 in | Wt 139.0 lb

## 2017-04-23 DIAGNOSIS — IMO0002 Reserved for concepts with insufficient information to code with codable children: Secondary | ICD-10-CM

## 2017-04-23 DIAGNOSIS — E108 Type 1 diabetes mellitus with unspecified complications: Secondary | ICD-10-CM

## 2017-04-23 DIAGNOSIS — E1065 Type 1 diabetes mellitus with hyperglycemia: Secondary | ICD-10-CM

## 2017-04-23 DIAGNOSIS — K869 Disease of pancreas, unspecified: Secondary | ICD-10-CM | POA: Diagnosis not present

## 2017-04-23 DIAGNOSIS — K8681 Exocrine pancreatic insufficiency: Secondary | ICD-10-CM | POA: Diagnosis not present

## 2017-04-23 DIAGNOSIS — Z713 Dietary counseling and surveillance: Secondary | ICD-10-CM | POA: Diagnosis not present

## 2017-04-23 DIAGNOSIS — E118 Type 2 diabetes mellitus with unspecified complications: Secondary | ICD-10-CM | POA: Diagnosis not present

## 2017-04-23 DIAGNOSIS — E1169 Type 2 diabetes mellitus with other specified complication: Secondary | ICD-10-CM

## 2017-04-23 DIAGNOSIS — E1165 Type 2 diabetes mellitus with hyperglycemia: Secondary | ICD-10-CM | POA: Insufficient documentation

## 2017-04-23 DIAGNOSIS — Z8507 Personal history of malignant neoplasm of pancreas: Secondary | ICD-10-CM

## 2017-04-23 MED ORDER — INSULIN GLARGINE 300 UNIT/ML ~~LOC~~ SOPN: 10.0000 [IU] | PEN_INJECTOR | Freq: Every day | SUBCUTANEOUS | 2 refills | Status: DC

## 2017-04-23 NOTE — Patient Instructions (Signed)
Goals Take 10 units of Toujeo at 9-10 pm at bedtime per DR. Nida. Test blood sugars before meals and use sliding scale of meal time insulin. If BS is less than 70, do not take meal time insulin. Call Dr. Dorris Fetch if BS are less than 70-mg/dl 2-3 times in a row or greater than 300 mg/dl a few times in a row.  Do not self adjust insulin amounts out side of instructions given. Keep eating three meals per day with 2-3 carb choices per meal Don't drink Glucerna unless BS is less than 70. Drink water.

## 2017-04-23 NOTE — Progress Notes (Signed)
  Medical Nutrition Therapy:  Appt start time: 0800 end time:  0830  Assessment:  Primary concerns today: Diabetes Type 1-due to pancreatic cancer.. She sees Dr. Dorris Fetch, Endocrinology.  She was suppose to be on 12 units of Toujeo and 5+ units of Humalog with meals. She notes she has had a low blood sugar or two and got super scared of Toujeo and stopped it. Has been only giving herself Humalog with meals and no Toujeo. A1C better at 8% down from 9.6%.   She notes she is frearful of low blood sugars. Has stopped doing her social activities due to fear of low blood sugars and having to test blood sugars. Appears depressed. Limited family support.   Eating better carb balanced meals. Needs reinforcement and on going support to help her understand the need and use of insulin to manage her diabetes.  Lab Results  Component Value Date   HGBA1C 8.0 (H) 04/17/2017   Preferred Learning Style:    No preference indicated   Learning Readiness:     Ready  Change in progress   MEDICATIONS:   DIETARY INTAKE:   B) 1/2 oatmeal, ww toast with apple butter, 3 stips [place] bacon and 1/2 banana, coffee, L) Blacked eyed peas, stewed tomatoes, cream potatoes/gravy, chicken livers and chicken, apple cobbler, water D) Ham, mac/cheese, green beans, potato salad, unsweet tea  Usual physical activity: ADL  Estimated energy needs: 1600  calories 180  g carbohydrates 120 g protein 44 g fat  Progress Towards Goal(s):  In progress.   Nutritional Diagnosis:  NB-1.1 Food and nutrition-related knowledge deficit As related to Diabetes.  As evidenced by A1C 9.6% secondary to pancreatic cancer.    Intervention:  Nutrition and Diabetes education provided on My Plate, CHO counting, meal planning, portion sizes, timing of meals, avoiding snacks between meals unless having a low blood sugar, target ranges for A1C and blood sugars, signs/symptoms and treatment of hyper/hypoglycemia, monitoring blood sugars,  taking medications as prescribed, benefits of exercising 30 minutes per day and prevention of complications of DM.  Goals Take 10 units of Toujeo at 9-10 pm at bedtime per DR. Nida. Test blood sugars before meals and use sliding scale of meal time insulin. If BS is less than 70, do not take meal time insulin. Call Dr. Dorris Fetch if BS are less than 70-mg/dl 2-3 times in a row or greater than 300 mg/dl a few times in a row.  Do not self adjust insulin amounts out side of instructions given. Keep eating three meals per day with 2-3 carb choices per meal Don't drink Glucerna unless BS is less than 70. Drink water.  Teaching Method Utilized:  Visual Auditory Hands on  Handouts given during visit include:  The Plate Method  Meal Plan Card  Diabetes Instructions.   Barriers to learning/adherence to lifestyle change: none  Demonstrated degree of understanding via:  Teach Back   Monitoring/Evaluation:  Dietary intake, exercise, meal planning, SBG, and body weight in 3 month(s).  Would recommend to screen/treat for possible depression.

## 2017-04-23 NOTE — Progress Notes (Signed)
Subjective:    Patient ID: Betty Nguyen, female    DOB: 1940/11/02.  she is being seen in f/u for management of currently uncontrolled symptomatic diabetes requested by  Sinda Du, MD.   Past Medical History:  Diagnosis Date  . Anxiety   . Anxiety and depression   . Asthma   . Breast carcinoma ([place]) 08/2009   Invasive ductal; left; lumpectomy and sentinel node excision- oncology visits yearly.  . Chest pain   . Depression   . Diabetes mellitus type II, controlled ([place])    diet controlled,exercise, no meds  . Diverticulitis 2012   Treated medically  . Gastroesophageal reflux   . Hyperlipidemia   . Nephrolithiasis   . Tick bite    [place] Tick, alpha gal positive, makes her allergic to meat   Past Surgical History:  Procedure Laterality Date  . BREAST LUMPECTOMY     Left  . CESAREAN SECTION    . CHOLECYSTECTOMY    . COLONOSCOPY  2004  . DILATION AND CURETTAGE OF UTERUS    . EUS N/A 09/07/2014   Procedure: UPPER ENDOSCOPIC ULTRASOUND (EUS) LINEAR;  Surgeon: Milus Banister, MD;  Location: [facility];  Service: Endoscopy;  Laterality: N/A;  . NEPHROLITHOTOMY    . TUBAL LIGATION    . WHIPPLE PROCEDURE     Social History   Socioeconomic History  . Marital status: Divorced    Spouse name: None  . Number of children: 2  . Years of education: None  . Highest education level: None  Social Needs  . Financial resource strain: None  . Food insecurity - worry: None  . Food insecurity - inability: None  . Transportation needs - medical: None  . Transportation needs - non-medical: None  Occupational History  . Occupation: Tourist information centre manager for [employer]  Tobacco Use  . Smoking status: Never Smoker  . Smokeless tobacco: Never Used  Substance and Sexual Activity  . Alcohol use: No  . Drug use: No  . Sexual activity: None  Other Topics Concern  . None  Social History Narrative  . None   Outpatient Encounter Medications as of 04/23/2017  Medication Sig  . albuterol  (PROVENTIL HFA;VENTOLIN HFA) 108 (90 BASE) MCG/ACT inhaler Inhale 2 puffs into the lungs every 6 (six) hours as needed for wheezing.  . Blood Glucose Monitoring Suppl (ONETOUCH VERIO) w/Device KIT 1 each by Does not apply route as needed.  . Continuous Blood Gluc Sensor (FREESTYLE LIBRE SENSOR SYSTEM) MISC Use one sensor every 10 days.  Marland Kitchen dicyclomine (BENTYL) 10 MG capsule Take 1 tablet once or twice daily as needed for abdominal pain and gas.  . docusate sodium (COLACE) 100 MG capsule Take 100 mg by mouth daily.  . fluticasone furoate-vilanterol (BREO ELLIPTA) 200-25 MCG/INH AEPB Inhale 1 puff into the lungs daily.  Marland Kitchen glucose blood (FORA BLOOD GLUCOSE TEST) test strip Use as instructed  . glycerin adult 2 g suppository Place 1 suppository rectally as needed for constipation.  . Insulin Glargine (TOUJEO MAX SOLOSTAR) 300 UNIT/ML SOPN Inject 10 Units into the skin at bedtime.  . insulin lispro (HUMALOG KWIKPEN) 100 UNIT/ML KiwkPen Inject 4-10 Units into the skin 3 (three) times daily before meals.  . Insulin Pen Needle (BD PEN NEEDLE NANO U/F) 32G X 4 MM MISC 1 each by Does not apply route 4 (four) times daily.  Marland Kitchen omeprazole (PRILOSEC) 20 MG capsule Take 1 capsule (20 mg total) by mouth daily.  . Pancrelipase, Lip-Prot-Amyl, (CREON) 24000-76000 units  CPEP Use 1 capsule with meals and snacks upto 6 a day  . Potassium Chloride (KLOR-CON 10 PO) Take by mouth 2 (two) times daily.  . psyllium (METAMUCIL SMOOTH TEXTURE) 28 % packet Take 1 packet by mouth 2 (two) times daily.  . valACYclovir (VALTREX) 500 MG tablet Take 500 mg by mouth daily as needed (outbreak).   . [DISCONTINUED] Insulin Glargine (TOUJEO MAX SOLOSTAR) 300 UNIT/ML SOPN Inject 12 Units into the skin at bedtime.   No facility-administered encounter medications on file as of 04/23/2017.     ALLERGIES: Allergies  Allergen Reactions  . Biaxin [Clarithromycin] Other (See Comments)    Hallucinations  . Carafate [Sucralfate] Other (See  Comments)    Flared up her vertigo, run her blood sugar up and caused constipation  . Codeine Other (See Comments)    Hallucinations   . Doxycycline Other (See Comments)    GI upset , turned stool orange  . Metronidazole Other (See Comments)    Heart Palpations -flagyl  . Cephalosporins Hives and Palpitations    VACCINATION STATUS:  There is no immunization history on file for this patient.  Diabetes  She presents for her follow-up diabetic visit. Diabetes type: Diabetes is due to pancreatectomy. Onset time: She was diagnosed at approximate age of 65. Her disease course has been improving. There are no hypoglycemic associated symptoms. Pertinent negatives for hypoglycemia include no confusion, headaches, pallor or seizures. Associated symptoms include blurred vision and fatigue. Pertinent negatives for diabetes include no chest pain, no polydipsia, no polyphagia and no polyuria. There are no hypoglycemic complications. Symptoms are improving. There are no diabetic complications. Risk factors for coronary artery disease include diabetes mellitus, post-menopausal, sedentary lifestyle and family history. Current diabetic treatment includes intensive insulin program. Her weight is increasing steadily. She is following a generally unhealthy diet. When asked about meal planning, she reported none. She has had a previous visit with a dietitian. She never participates in exercise. Her breakfast blood glucose range is generally 140-180 mg/dl. Her lunch blood glucose range is generally 180-200 mg/dl. Her dinner blood glucose range is generally 180-200 mg/dl. Her bedtime blood glucose range is generally 180-200 mg/dl. Her overall blood glucose range is 180-200 mg/dl. An ACE inhibitor/angiotensin II receptor blocker is not being taken. She does not see a podiatrist.Eye exam is not current.    Review of Systems  Constitutional: Positive for fatigue. Negative for chills, fever and unexpected weight change.   HENT: Negative for trouble swallowing and voice change.   Eyes: Positive for blurred vision. Negative for visual disturbance.  Respiratory: Negative for cough, shortness of breath and wheezing.   Cardiovascular: Negative for chest pain, palpitations and leg swelling.  Gastrointestinal: Positive for diarrhea. Negative for nausea and vomiting.  Endocrine: Negative for cold intolerance, heat intolerance, polydipsia, polyphagia and polyuria.  Musculoskeletal: Negative for arthralgias and myalgias.  Skin: Negative for color change, pallor, rash and wound.  Neurological: Negative for seizures and headaches.  Psychiatric/Behavioral: Negative for confusion and suicidal ideas.    Objective:    BP 131/73   Pulse 76   Ht 4' 11.5" (1.511 m)   Wt 139 lb (63 kg)   BMI 27.60 kg/m   Wt Readings from Last 3 Encounters:  04/23/17 139 lb (63 kg)  04/23/17 139 lb (63 kg)  02/19/17 124 lb (56.2 kg)     Physical Exam  Constitutional: She is oriented to person, place, and time. She appears well-developed.  HENT:  Head: Normocephalic and atraumatic.  Eyes:  EOM are normal.  Neck: Normal range of motion. Neck supple. No tracheal deviation present. No thyromegaly present.  Cardiovascular: Normal rate and regular rhythm.  Pulmonary/Chest: Effort normal and breath sounds normal.  Abdominal: Soft. Bowel sounds are normal. There is no tenderness. There is no guarding.  Musculoskeletal: Normal range of motion. She exhibits no edema.  Neurological: She is alert and oriented to person, place, and time. She has normal reflexes. No cranial nerve deficit. Coordination normal.  Skin: Skin is warm and dry. No rash noted. No erythema. No pallor.  Psychiatric: She has a normal mood and affect. Judgment normal.    Recent Results (from the past 2160 hour(s))  COMPLETE METABOLIC PANEL WITH GFR     Status: Abnormal   Collection Time: 04/17/17  7:08 AM  Result Value Ref Range   Glucose, Bld 187 (H) 65 - 99 mg/dL     Comment: .            Fasting reference interval . For someone without known diabetes, a glucose value >125 mg/dL indicates that they may have diabetes and this should be confirmed with a follow-up test. .    BUN 17 7 - 25 mg/dL   Creat 0.69 0.60 - 0.93 mg/dL    Comment: For patients >95 years of age, the reference limit for Creatinine is approximately 13% higher for people identified as African-American. .    GFR, Est Non African American 85 > OR = 60 mL/min/1.53m   GFR, Est African American 98 > OR = 60 mL/min/1.764m  BUN/Creatinine Ratio NOT APPLICABLE 6 - 22 (calc)   Sodium 140 135 - 146 mmol/L   Potassium 4.5 3.5 - 5.3 mmol/L   Chloride 105 98 - 110 mmol/L   CO2 29 20 - 32 mmol/L   Calcium 9.3 8.6 - 10.4 mg/dL   Total Protein 6.6 6.1 - 8.1 g/dL   Albumin 3.7 3.6 - 5.1 g/dL   Globulin 2.9 1.9 - 3.7 g/dL (calc)   AG Ratio 1.3 1.0 - 2.5 (calc)   Total Bilirubin 1.3 (H) 0.2 - 1.2 mg/dL   Alkaline phosphatase (APISO) 82 33 - 130 U/L   AST 15 10 - 35 U/L   ALT 31 (H) 6 - 29 U/L  Hemoglobin A1c     Status: Abnormal   Collection Time: 04/17/17  7:08 AM  Result Value Ref Range   Hgb A1c MFr Bld 8.0 (H) <5.7 % of total Hgb    Comment: For someone without known diabetes, a hemoglobin A1c value of 6.5% or greater indicates that they may have  diabetes and this should be confirmed with a follow-up  test. . For someone with known diabetes, a value <7% indicates  that their diabetes is well controlled and a value  greater than or equal to 7% indicates suboptimal  control. A1c targets should be individualized based on  duration of diabetes, age, comorbid conditions, and  other considerations. . Currently, no consensus exists regarding use of hemoglobin A1c for diagnosis of diabetes for children. .    Mean Plasma Glucose 183 (calc)   eAG (mmol/L) 10.1 (calc)  Lipid panel     Status: None   Collection Time: 04/17/17  7:08 AM  Result Value Ref Range   Cholesterol 140  <200 mg/dL   HDL 69 >50 mg/dL   Triglycerides 58 <150 mg/dL   LDL Cholesterol (Calc) 58 mg/dL (calc)    Comment: Reference range: <100 . Desirable range <100 mg/dL for primary prevention;   <  70 mg/dL for patients with CHD or diabetic patients  with > or = 2 CHD risk factors. Marland Kitchen LDL-C is now calculated using the Therien-Hopkins  calculation, which is a validated novel method providing  better accuracy than the Friedewald equation in the  estimation of LDL-C.  Cresenciano Genre et al. Annamaria Helling. 0623;762(83): 2061-2068  (http://education.[employer].com/faq/FAQ164)    Total CHOL/HDL Ratio 2.0 <5.0 (calc)   Non-HDL Cholesterol (Calc) 71 <130 mg/dL (calc)    Comment: For patients with diabetes plus 1 major ASCVD risk  factor, treating to a non-HDL-C goal of <100 mg/dL  (LDL-C of <70 mg/dL) is considered a therapeutic  option.        Assessment & Plan:   1. Diabetes mellitus associated with pancreatic disease [place])  - Patient has currently uncontrolled,  Symptomatic, Pancreatic diabetes since age 80, due to [place]  Procedure in September 2016 [facility] due to mucinous cystic lesions of the pancreatic head- removal of the pancreatic head- pancreaticoduodenectomy. - She came with better blood glucose readings, A1c at 8% improving from 9.6%. - She has not used her Toujeo consistently due to fear of hypoglycemia, she misused her Humalog. - Kerrin Mo Baker remains at a high risk for more acute and chronic complications which include CAD, CVA, CKD, retinopathy, and neuropathy. These are all discussed in detail with the patient.  - I have counseled her on diet management by adopting a carbohydrate restricted/protein rich diet. -  Suggestion is made for her to avoid simple carbohydrates  from her diet including Cakes, Sweet Desserts / Pastries, Ice Cream, Soda (diet and regular), Sweet Tea, Candies, Chips, Cookies, Store Bought Juices, Alcohol in Excess of  1-2 drinks a day,  Artificial Sweeteners, and "Sugar-free" Products. This will help patient to have stable blood glucose profile and potentially avoid unintended weight gain.  - I encouraged her to switch to  unprocessed or minimally processed complex starch and increased protein intake (animal or plant source), fruits, and vegetables.  - she is advised to stick to a routine mealtimes to eat 3 meals  a day and avoid unnecessary snacks ( to snack only to correct hypoglycemia).    - I have approached her with the following individualized plan to manage diabetes and patient agrees:   -  She came with  fluctuating blood glucose profile, overall improving. This is due to the fact that she did not use her basal insulin properly.   - Her legitimate concern of hypoglycemia is discussed in detail. I will reapproach her to resume basal insulin Toujeo at a lower dose of 10 units daily at bedtime, lower Humalog to 4-10 units 3 times a day before meals for pre-meal blood glucose above 70 mg/dL.  - Given her history of pancreatectomy, and her current presentation with  postprandial hyperglycemia, she will need basal/bolus insulin to treat her diabetes to target. She reluctantly agrees. - She will continue  strict monitoring of blood glucose 4 times a day-before meals and at bedtime, while waiting for continuous glucose monitoring prescription decision.  -Patient is encouraged to call clinic for blood glucose levels less than 70 or above 300 mg /dl.   - Insulin is  her exclusive choice of therapy  to treat her diabetes. - she is not a candidate for metformin, SGLT2 inhibitors, nor incretin therapy .  - Patient specific target  A1c;  LDL, HDL, Triglycerides, and  Waist Circumference were discussed in detail.  2) BP/HTN:  Controlled.  3) Lipids/HPL:  Lipid panel unknown.  She will have fasting lipid panel on subsequent visits. 4)  exocrine pancreatic insufficiency: She  is benefiting from Creon therapy. I advised her to  continue  Creon 24,000 units, 1 with Meals and Snacks, she reports symptomatic improvement with this medication. - Her celiac screen is negative, I advised her to follow gluten-regular diet.  5) Chronic Care/Health Maintenance:  -she  is not  on ACEI/ARB and Statin medications and  is encouraged to continue to follow up with Ophthalmology, Dentist,  Podiatrist at least yearly or according to recommendations, and advised to   stay away from smoking. I have recommended yearly flu vaccine and pneumonia vaccination at least every 5 years, and  sleep for at least 7 hours a day.  - Time spent with the patient: 25 min, of which >50% was spent in reviewing her sugar logs , discussing her hypo- and hyper-glycemic episodes, reviewing her current and  previous labs and insulin doses and developing a plan to avoid hypo- and hyper-glycemia.   - I advised patient to maintain close follow up with Sinda Du, MD for primary care needs.  Follow up plan: - Return in about 3 months (around 07/22/2017) for meter, and logs.  Glade Lloyd, MD Phone: [phone]  Fax: [phone]   04/23/2017, 10:19 AM This note was partially dictated with voice recognition software. Similar sounding words can be transcribed inadequately or may not  be corrected upon review.

## 2017-05-19 ENCOUNTER — Telehealth: Payer: Self-pay | Admitting: Nutrition

## 2017-05-19 NOTE — Telephone Encounter (Signed)
VM to call to discuss libre from [employer]. Need her to call them before they ship product.

## 2017-05-26 DIAGNOSIS — S61412A Laceration without foreign body of left hand, initial encounter: Secondary | ICD-10-CM | POA: Diagnosis not present

## 2017-06-15 ENCOUNTER — Ambulatory Visit (INDEPENDENT_AMBULATORY_CARE_PROVIDER_SITE_OTHER): Payer: Medicare Other | Admitting: "Endocrinology

## 2017-06-15 VITALS — Ht 59.5 in | Wt 139.0 lb

## 2017-06-15 DIAGNOSIS — J449 Chronic obstructive pulmonary disease, unspecified: Secondary | ICD-10-CM | POA: Diagnosis not present

## 2017-06-15 DIAGNOSIS — K8681 Exocrine pancreatic insufficiency: Secondary | ICD-10-CM | POA: Diagnosis not present

## 2017-06-15 DIAGNOSIS — E1165 Type 2 diabetes mellitus with hyperglycemia: Secondary | ICD-10-CM

## 2017-06-15 DIAGNOSIS — F419 Anxiety disorder, unspecified: Secondary | ICD-10-CM | POA: Diagnosis not present

## 2017-06-15 DIAGNOSIS — E119 Type 2 diabetes mellitus without complications: Secondary | ICD-10-CM | POA: Diagnosis not present

## 2017-06-15 NOTE — Progress Notes (Signed)
Pt brings in hernewFreestyle Libretoday. She has the 14 day reader and sensors. Went over instructions on how to use the Libre system. Also went over instruction on how to properly apply the sensor to her arm. Libre sensor was placed on the back of pts upper right arm. She voices understanding on how to use the system and how to get BG reading for insulin injections and as needed. She was advised to contact the office if he has any questions or concerns. 

## 2017-06-17 ENCOUNTER — Ambulatory Visit (INDEPENDENT_AMBULATORY_CARE_PROVIDER_SITE_OTHER): Payer: Medicare Other | Admitting: "Endocrinology

## 2017-06-17 ENCOUNTER — Encounter: Payer: Self-pay | Admitting: "Endocrinology

## 2017-06-17 VITALS — Ht 59.5 in | Wt 138.0 lb

## 2017-06-17 DIAGNOSIS — E1169 Type 2 diabetes mellitus with other specified complication: Secondary | ICD-10-CM

## 2017-06-17 DIAGNOSIS — K8681 Exocrine pancreatic insufficiency: Secondary | ICD-10-CM

## 2017-06-17 DIAGNOSIS — K869 Disease of pancreas, unspecified: Secondary | ICD-10-CM

## 2017-06-17 DIAGNOSIS — E1165 Type 2 diabetes mellitus with hyperglycemia: Secondary | ICD-10-CM | POA: Diagnosis not present

## 2017-06-17 LAB — GLUCOSE, POCT (MANUAL RESULT ENTRY): POC GLUCOSE: 131 mg/dL — AB (ref 70–99)

## 2017-06-17 NOTE — Progress Notes (Signed)
Subjective:    Patient ID: Betty Nguyen, female    DOB: 22-Jan-1941.  she is being seen in f/u for management of currently uncontrolled symptomatic diabetes requested by  Sinda Du, MD.   Past Medical History:  Diagnosis Date  . Anxiety   . Anxiety and depression   . Asthma   . Breast carcinoma ([place]) 08/2009   Invasive ductal; left; lumpectomy and sentinel node excision- oncology visits yearly.  . Chest pain   . Depression   . Diabetes mellitus type II, controlled ([place])    diet controlled,exercise, no meds  . Diverticulitis 2012   Treated medically  . Gastroesophageal reflux   . Hyperlipidemia   . Nephrolithiasis   . Tick bite    [place] Tick, alpha gal positive, makes her allergic to meat   Past Surgical History:  Procedure Laterality Date  . BREAST LUMPECTOMY     Left  . CESAREAN SECTION    . CHOLECYSTECTOMY    . COLONOSCOPY  2004  . DILATION AND CURETTAGE OF UTERUS    . EUS N/A 09/07/2014   Procedure: UPPER ENDOSCOPIC ULTRASOUND (EUS) LINEAR;  Surgeon: Milus Banister, MD;  Location: [facility];  Service: Endoscopy;  Laterality: N/A;  . NEPHROLITHOTOMY    . TUBAL LIGATION    . WHIPPLE PROCEDURE     Social History   Socioeconomic History  . Marital status: Divorced    Spouse name: None  . Number of children: 2  . Years of education: None  . Highest education level: None  Social Needs  . Financial resource strain: None  . Food insecurity - worry: None  . Food insecurity - inability: None  . Transportation needs - medical: None  . Transportation needs - non-medical: None  Occupational History  . Occupation: Tourist information centre manager for [employer]  Tobacco Use  . Smoking status: Never Smoker  . Smokeless tobacco: Never Used  Substance and Sexual Activity  . Alcohol use: No  . Drug use: No  . Sexual activity: None  Other Topics Concern  . None  Social History Narrative  . None   Outpatient Encounter Medications as of 06/17/2017  Medication Sig  . albuterol (PROVENTIL  HFA;VENTOLIN HFA) 108 (90 BASE) MCG/ACT inhaler Inhale 2 puffs into the lungs every 6 (six) hours as needed for wheezing.  . Blood Glucose Monitoring Suppl (ONETOUCH VERIO) w/Device KIT 1 each by Does not apply route as needed.  . Continuous Blood Gluc Sensor (FREESTYLE LIBRE SENSOR SYSTEM) MISC Use one sensor every 10 days.  Marland Kitchen dicyclomine (BENTYL) 10 MG capsule Take 1 tablet once or twice daily as needed for abdominal pain and gas.  . docusate sodium (COLACE) 100 MG capsule Take 100 mg by mouth daily.  . fluticasone furoate-vilanterol (BREO ELLIPTA) 200-25 MCG/INH AEPB Inhale 1 puff into the lungs daily.  Marland Kitchen glucose blood (FORA BLOOD GLUCOSE TEST) test strip Use as instructed  . glycerin adult 2 g suppository Place 1 suppository rectally as needed for constipation.  . Insulin Glargine (TOUJEO MAX SOLOSTAR) 300 UNIT/ML SOPN Inject 10 Units into the skin at bedtime.  . insulin lispro (HUMALOG KWIKPEN) 100 UNIT/ML KiwkPen Inject 4-6 Units into the skin 3 (three) times daily before meals.  . Insulin Pen Needle (BD PEN NEEDLE NANO U/F) 32G X 4 MM MISC 1 each by Does not apply route 4 (four) times daily.  Marland Kitchen omeprazole (PRILOSEC) 20 MG capsule Take 1 capsule (20 mg total) by mouth daily.  . Pancrelipase, Lip-Prot-Amyl, (CREON) 24000-76000 units  CPEP Use 1 capsule with meals and snacks upto 6 a day  . Potassium Chloride (KLOR-CON 10 PO) Take by mouth 2 (two) times daily.  . psyllium (METAMUCIL SMOOTH TEXTURE) 28 % packet Take 1 packet by mouth 2 (two) times daily.  . valACYclovir (VALTREX) 500 MG tablet Take 500 mg by mouth daily as needed (outbreak).    No facility-administered encounter medications on file as of 06/17/2017.     ALLERGIES: Allergies  Allergen Reactions  . Biaxin [Clarithromycin] Other (See Comments)    Hallucinations  . Carafate [Sucralfate] Other (See Comments)    Flared up her vertigo, run her blood sugar up and caused constipation  . Codeine Other (See Comments)     Hallucinations   . Doxycycline Other (See Comments)    GI upset , turned stool orange  . Metronidazole Other (See Comments)    Heart Palpations -flagyl  . Cephalosporins Hives and Palpitations    VACCINATION STATUS:  There is no immunization history on file for this patient.  Diabetes  She presents for her follow-up diabetic visit. Diabetes type: Diabetes is due to pancreatectomy. Onset time: She was diagnosed at approximate age of 77. Her disease course has been improving. There are no hypoglycemic associated symptoms. Pertinent negatives for hypoglycemia include no confusion, headaches, pallor or seizures. Associated symptoms include blurred vision and fatigue. Pertinent negatives for diabetes include no chest pain, no polydipsia, no polyphagia and no polyuria. There are no hypoglycemic complications. Symptoms are improving. There are no diabetic complications. Risk factors for coronary artery disease include diabetes mellitus, post-menopausal, sedentary lifestyle and family history. Current diabetic treatment includes intensive insulin program. Her weight is increasing steadily. She is following a generally unhealthy diet. When asked about meal planning, she reported none. She has had a previous visit with a dietitian. She never participates in exercise. Her breakfast blood glucose range is generally 140-180 mg/dl. Her lunch blood glucose range is generally 180-200 mg/dl. Her dinner blood glucose range is generally 180-200 mg/dl. Her bedtime blood glucose range is generally 180-200 mg/dl. Her overall blood glucose range is 180-200 mg/dl. An ACE inhibitor/angiotensin II receptor blocker is not being taken. She does not see a podiatrist.Eye exam is not current.    Review of Systems  Constitutional: Positive for fatigue. Negative for chills, fever and unexpected weight change.  HENT: Negative for trouble swallowing and voice change.   Eyes: Positive for blurred vision. Negative for visual  disturbance.  Respiratory: Negative for cough, shortness of breath and wheezing.   Cardiovascular: Negative for chest pain, palpitations and leg swelling.  Gastrointestinal: Positive for diarrhea. Negative for nausea and vomiting.  Endocrine: Negative for cold intolerance, heat intolerance, polydipsia, polyphagia and polyuria.  Musculoskeletal: Negative for arthralgias and myalgias.  Skin: Negative for color change, pallor, rash and wound.  Neurological: Negative for seizures and headaches.  Psychiatric/Behavioral: Negative for confusion and suicidal ideas.    Objective:    Ht 4' 11.5" (1.511 m)   Wt 138 lb (62.6 kg)   BMI 27.41 kg/m   Wt Readings from Last 3 Encounters:  06/17/17 138 lb (62.6 kg)  06/15/17 139 lb (63 kg)  04/23/17 139 lb (63 kg)     Physical Exam  Constitutional: She is oriented to person, place, and time. She appears well-developed.  HENT:  Head: Normocephalic and atraumatic.  Eyes: EOM are normal.  Neck: Normal range of motion. Neck supple. No tracheal deviation present. No thyromegaly present.  Cardiovascular: Normal rate and regular rhythm.  Pulmonary/Chest:  Effort normal and breath sounds normal.  Abdominal: Soft. Bowel sounds are normal. There is no tenderness. There is no guarding.  Musculoskeletal: Normal range of motion. She exhibits no edema.  Neurological: She is alert and oriented to person, place, and time. She has normal reflexes. No cranial nerve deficit. Coordination normal.  Skin: Skin is warm and dry. No rash noted. No erythema. No pallor.  Psychiatric: She has a normal mood and affect. Judgment normal.    Recent Results (from the past 2160 hour(s))  COMPLETE METABOLIC PANEL WITH GFR     Status: Abnormal   Collection Time: 04/17/17  7:08 AM  Result Value Ref Range   Glucose, Bld 187 (H) 65 - 99 mg/dL    Comment: .            Fasting reference interval . For someone without known diabetes, a glucose value >125 mg/dL indicates that they  may have diabetes and this should be confirmed with a follow-up test. .    BUN 17 7 - 25 mg/dL   Creat 0.69 0.60 - 0.93 mg/dL    Comment: For patients >36 years of age, the reference limit for Creatinine is approximately 13% higher for people identified as African-American. .    GFR, Est Non African American 85 > OR = 60 mL/min/1.70m   GFR, Est African American 98 > OR = 60 mL/min/1.722m  BUN/Creatinine Ratio NOT APPLICABLE 6 - 22 (calc)   Sodium 140 135 - 146 mmol/L   Potassium 4.5 3.5 - 5.3 mmol/L   Chloride 105 98 - 110 mmol/L   CO2 29 20 - 32 mmol/L   Calcium 9.3 8.6 - 10.4 mg/dL   Total Protein 6.6 6.1 - 8.1 g/dL   Albumin 3.7 3.6 - 5.1 g/dL   Globulin 2.9 1.9 - 3.7 g/dL (calc)   AG Ratio 1.3 1.0 - 2.5 (calc)   Total Bilirubin 1.3 (H) 0.2 - 1.2 mg/dL   Alkaline phosphatase (APISO) 82 33 - 130 U/L   AST 15 10 - 35 U/L   ALT 31 (H) 6 - 29 U/L  Hemoglobin A1c     Status: Abnormal   Collection Time: 04/17/17  7:08 AM  Result Value Ref Range   Hgb A1c MFr Bld 8.0 (H) <5.7 % of total Hgb    Comment: For someone without known diabetes, a hemoglobin A1c value of 6.5% or greater indicates that they may have  diabetes and this should be confirmed with a follow-up  test. . For someone with known diabetes, a value <7% indicates  that their diabetes is well controlled and a value  greater than or equal to 7% indicates suboptimal  control. A1c targets should be individualized based on  duration of diabetes, age, comorbid conditions, and  other considerations. . Currently, no consensus exists regarding use of hemoglobin A1c for diagnosis of diabetes for children. .    Mean Plasma Glucose 183 (calc)   eAG (mmol/L) 10.1 (calc)  Lipid panel     Status: None   Collection Time: 04/17/17  7:08 AM  Result Value Ref Range   Cholesterol 140 <200 mg/dL   HDL 69 >50 mg/dL   Triglycerides 58 <150 mg/dL   LDL Cholesterol (Calc) 58 mg/dL (calc)    Comment: Reference range:  <100 . Desirable range <100 mg/dL for primary prevention;   <70 mg/dL for patients with CHD or diabetic patients  with > or = 2 CHD risk factors. . Marland KitchenDL-C is now calculated using the  Nooney-Hopkins  calculation, which is a validated novel method providing  better accuracy than the Friedewald equation in the  estimation of LDL-C.  Cresenciano Genre et al. Annamaria Helling. 5366;440(34): 2061-2068  (http://education.[employer].com/faq/FAQ164)    Total CHOL/HDL Ratio 2.0 <5.0 (calc)   Non-HDL Cholesterol (Calc) 71 <130 mg/dL (calc)    Comment: For patients with diabetes plus 1 major ASCVD risk  factor, treating to a non-HDL-C goal of <100 mg/dL  (LDL-C of <70 mg/dL) is considered a therapeutic  option.   POCT glucose (manual entry)     Status: Abnormal   Collection Time: 06/17/17 11:20 AM  Result Value Ref Range   POC Glucose 131 (A) 70 - 99 mg/dl    Comment: 3 hrs PC       Assessment & Plan:   1. Diabetes mellitus associated with pancreatic disease [facility])  - Patient has currently uncontrolled,  Symptomatic, Pancreatic diabetes since age 14, due to [place]  Procedure in September 2016 at [facility] due to mucinous cystic lesions of the pancreatic head- removal of the pancreatic head- pancreaticoduodenectomy. - She came with better blood glucose readings, her recent A1c was better at 8% improving from 9.6%.  -She just received her CGM sensor and gas anxious about some discrepancy in glucose readings between her meter and her sensor.   - She has used her Toujeo better at this time, no hypoglycemia documented or reported.  She is still has significant fear of hypoglycemia.    - Kerrin Mo Baker remains at a high risk for more acute and chronic complications which include CAD, CVA, CKD, retinopathy, and neuropathy. These are all discussed in detail with the patient.  - I have counseled her on diet management by adopting a carbohydrate restricted/protein rich diet.  -   Suggestion is made for her to avoid simple carbohydrates  from her diet including Cakes, Sweet Desserts / Pastries, Ice Cream, Soda (diet and regular), Sweet Tea, Candies, Chips, Cookies, Store Bought Juices, Alcohol in Excess of  1-2 drinks a day, Artificial Sweeteners, and "Sugar-free" Products. This will help patient to have stable blood glucose profile and potentially avoid unintended weight gain.  - I encouraged her to switch to  unprocessed or minimally processed complex starch and increased protein intake (animal or plant source), fruits, and vegetables.  - she is advised to stick to a routine mealtimes to eat 3 meals  a day and avoid unnecessary snacks ( to snack only to correct hypoglycemia).    - I have approached her with the following individualized plan to manage diabetes and patient agrees:   -She is still struggling with the fact that she has to take multiple daily injections of insulin to treat her diabetes. - Given her history of pancreatectomy, and her current presentation with  postprandial hyperglycemia, she will need basal/bolus insulin to treat her diabetes to target.  - #1 goal in her treatment of diabetes would be avoiding hypoglycemia which she is rightly concerned about.  -Her CGM monitor will be of big help avoiding hypoglycemia and controlling reasonable glucose profile.  -It will take some time for her to get comfortable using this device. -I have explained the importance of this CGM machine, the importance of the basal insulin to avoid diabetic ketoacidosis and adjustment of her prandial insulin to keep her blood glucose in the safe range.  -I advised her to continue basal insulin Toujeo at 10 units daily at bedtime,  lower Humalog to 4-6 units 3 times a  day before meals for pre-meal blood glucose above 70 mg/dL. - She will continue to use her CGM to document blood glucose 4 times a day-before meals for insulin dosing purposes and at bedtime. -Patient is encouraged to  call clinic for blood glucose levels less than 70 or above 300 mg /dl.   - Insulin is  her exclusive choice of therapy  to treat her diabetes. - she is not a candidate for metformin, SGLT2 inhibitors, nor incretin therapy .  - Patient specific target  A1c;  LDL, HDL, Triglycerides, and  Waist Circumference were discussed in detail.  2) BP/HTN: Her blood pressure is controlled to target.   3) Lipids/HPL: Controlled with LDL at 58.  4)  exocrine pancreatic insufficiency: She  is benefiting from Creon therapy. I advised her to continue  Creon 24,000 units, 1 with Meals and Snacks, she reports symptomatic improvement with this medication. - Her celiac screen is negative, I advised her to follow gluten-regular diet.  5) Chronic Care/Health Maintenance:  -she  is not  on ACEI/ARB and Statin medications and  is encouraged to continue to follow up with Ophthalmology, Dentist,  Podiatrist at least yearly or according to recommendations, and advised to   stay away from smoking. I have recommended yearly flu vaccine and pneumonia vaccination at least every 5 years, and  sleep for at least 7 hours a day.  - Time spent with the patient: 25 min, of which >50% was spent in reviewing her blood glucose logs , discussing her hypo- and hyper-glycemic episodes, reviewing her current and  previous labs and insulin doses and developing a plan to avoid hypo- and hyper-glycemia. Please refer to Patient Instructions for Blood Glucose Monitoring and Insulin/Medications Dosing Guide"  in media tab for additional information.    - I advised patient to maintain close follow up with Sinda Du, MD for primary care needs.  Follow up plan: - Return in about 13 days (around 06/30/2017) for follow up with meter and logs- no labs.  Glade Lloyd, MD Phone: [phone]  Fax: [phone]   06/17/2017, 1:44 PM This note was partially dictated with voice recognition software. Similar sounding words can be transcribed  inadequately or may not  be corrected upon review.

## 2017-06-23 ENCOUNTER — Ambulatory Visit (INDEPENDENT_AMBULATORY_CARE_PROVIDER_SITE_OTHER): Payer: Medicare Other | Admitting: "Endocrinology

## 2017-06-23 ENCOUNTER — Encounter: Payer: Self-pay | Admitting: "Endocrinology

## 2017-06-23 VITALS — Ht 59.5 in | Wt 133.0 lb

## 2017-06-23 DIAGNOSIS — K869 Disease of pancreas, unspecified: Secondary | ICD-10-CM | POA: Diagnosis not present

## 2017-06-23 DIAGNOSIS — K8681 Exocrine pancreatic insufficiency: Secondary | ICD-10-CM

## 2017-06-23 DIAGNOSIS — E1169 Type 2 diabetes mellitus with other specified complication: Secondary | ICD-10-CM | POA: Diagnosis not present

## 2017-06-23 NOTE — Progress Notes (Signed)
Subjective:    Patient ID: Betty Nguyen, female    DOB: [birth year].  she is being seen in f/u for management of currently uncontrolled symptomatic diabetes requested by  Sinda Du, MD.   Past Medical History:  Diagnosis Date  . Anxiety   . Anxiety and depression   . Asthma   . Breast carcinoma ([place]) 08/2009   Invasive ductal; left; lumpectomy and sentinel node excision- oncology visits yearly.  . Chest pain   . Depression   . Diabetes mellitus type II, controlled ([place])    diet controlled,exercise, no meds  . Diverticulitis 2012   Treated medically  . Gastroesophageal reflux   . Hyperlipidemia   . Nephrolithiasis   . Tick bite    [place] Tick, alpha gal positive, makes her allergic to meat   Past Surgical History:  Procedure Laterality Date  . BREAST LUMPECTOMY     Left  . CESAREAN SECTION    . CHOLECYSTECTOMY    . COLONOSCOPY  2004  . DILATION AND CURETTAGE OF UTERUS    . EUS N/A 09/07/2014   Procedure: UPPER ENDOSCOPIC ULTRASOUND (EUS) LINEAR;  Surgeon: Milus Banister, MD;  Location: [facility];  Service: Endoscopy;  Laterality: N/A;  . NEPHROLITHOTOMY    . TUBAL LIGATION    . WHIPPLE PROCEDURE     Social History   Socioeconomic History  . Marital status: Divorced    Spouse name: None  . Number of children: 2  . Years of education: None  . Highest education level: None  Social Needs  . Financial resource strain: None  . Food insecurity - worry: None  . Food insecurity - inability: None  . Transportation needs - medical: None  . Transportation needs - non-medical: None  Occupational History  . Occupation: Tourist information centre manager for fun  Tobacco Use  . Smoking status: Never Smoker  . Smokeless tobacco: Never Used  Substance and Sexual Activity  . Alcohol use: No  . Drug use: No  . Sexual activity: None  Other Topics Concern  . None  Social History Narrative  . None   Outpatient Encounter Medications as of 06/23/2017  Medication Sig  . albuterol (PROVENTIL  HFA;VENTOLIN HFA) 108 (90 BASE) MCG/ACT inhaler Inhale 2 puffs into the lungs every 6 (six) hours as needed for wheezing.  . Blood Glucose Monitoring Suppl (ONETOUCH VERIO) w/Device KIT 1 each by Does not apply route as needed.  . Continuous Blood Gluc Sensor (FREESTYLE LIBRE SENSOR SYSTEM) MISC Use one sensor every 10 days.  Marland Kitchen dicyclomine (BENTYL) 10 MG capsule Take 1 tablet once or twice daily as needed for abdominal pain and gas.  . docusate sodium (COLACE) 100 MG capsule Take 100 mg by mouth daily.  . fluticasone furoate-vilanterol (BREO ELLIPTA) 200-25 MCG/INH AEPB Inhale 1 puff into the lungs daily.  Marland Kitchen glucose blood (FORA BLOOD GLUCOSE TEST) test strip Use as instructed  . glycerin adult 2 g suppository Place 1 suppository rectally as needed for constipation.  . Insulin Glargine (TOUJEO MAX SOLOSTAR) 300 UNIT/ML SOPN Inject 10 Units into the skin at bedtime.  . insulin lispro (HUMALOG KWIKPEN) 100 UNIT/ML KiwkPen Inject 4-6 Units into the skin 3 (three) times daily before meals.  . Insulin Pen Needle (BD PEN NEEDLE NANO U/F) 32G X 4 MM MISC 1 each by Does not apply route 4 (four) times daily.  Marland Kitchen omeprazole (PRILOSEC) 20 MG capsule Take 1 capsule (20 mg total) by mouth daily.  . Pancrelipase, Lip-Prot-Amyl, (CREON) 24000-76000 units  CPEP Use 1 capsule with meals and snacks upto 6 a day  . Potassium Chloride (KLOR-CON 10 PO) Take by mouth 2 (two) times daily.  . psyllium (METAMUCIL SMOOTH TEXTURE) 28 % packet Take 1 packet by mouth 2 (two) times daily.  . valACYclovir (VALTREX) 500 MG tablet Take 500 mg by mouth daily as needed (outbreak).    No facility-administered encounter medications on file as of 06/23/2017.     ALLERGIES: Allergies  Allergen Reactions  . Biaxin [Clarithromycin] Other (See Comments)    Hallucinations  . Carafate [Sucralfate] Other (See Comments)    Flared up her vertigo, run her blood sugar up and caused constipation  . Codeine Other (See Comments)     Hallucinations   . Doxycycline Other (See Comments)    GI upset , turned stool orange  . Metronidazole Other (See Comments)    Heart Palpations -flagyl  . Cephalosporins Hives and Palpitations    VACCINATION STATUS:  There is no immunization history on file for this patient.  Diabetes  She presents for her follow-up diabetic visit. She has type 1 (Diabetes is due to pancreatectomy) diabetes mellitus. Onset time: She was diagnosed at approximate age of 38. Her disease course has been fluctuating. There are no hypoglycemic associated symptoms. Pertinent negatives for hypoglycemia include no confusion, headaches, pallor or seizures. Associated symptoms include blurred vision and fatigue. Pertinent negatives for diabetes include no chest pain, no polydipsia, no polyphagia and no polyuria. There are no hypoglycemic complications. Symptoms are improving. There are no diabetic complications. Risk factors for coronary artery disease include diabetes mellitus, post-menopausal, sedentary lifestyle and family history. Current diabetic treatment includes intensive insulin program. Her weight is decreasing steadily. She is following a generally unhealthy diet. When asked about meal planning, she reported none. She has had a previous visit with a dietitian. She never participates in exercise. Her home blood glucose trend is fluctuating minimally. Her breakfast blood glucose range is generally >200 mg/dl. Her lunch blood glucose range is generally >200 mg/dl. Her dinner blood glucose range is generally >200 mg/dl. Her bedtime blood glucose range is generally >200 mg/dl. Her overall blood glucose range is >200 mg/dl. An ACE inhibitor/angiotensin II receptor blocker is not being taken. She does not see a podiatrist.Eye exam is not current.    Review of Systems  Constitutional: Positive for fatigue. Negative for chills, fever and unexpected weight change.  HENT: Negative for trouble swallowing and voice change.    Eyes: Positive for blurred vision. Negative for visual disturbance.  Respiratory: Negative for cough, shortness of breath and wheezing.   Cardiovascular: Negative for chest pain, palpitations and leg swelling.  Gastrointestinal: Positive for diarrhea. Negative for nausea and vomiting.  Endocrine: Negative for cold intolerance, heat intolerance, polydipsia, polyphagia and polyuria.  Musculoskeletal: Negative for arthralgias and myalgias.  Skin: Negative for color change, pallor, rash and wound.  Neurological: Negative for seizures and headaches.  Psychiatric/Behavioral: Negative for confusion and suicidal ideas.    Objective:    Ht 4' 11.5" (1.511 m)   Wt 133 lb (60.3 kg)   BMI 26.41 kg/m   Wt Readings from Last 3 Encounters:  06/23/17 133 lb (60.3 kg)  06/17/17 138 lb (62.6 kg)  06/15/17 139 lb (63 kg)     Physical Exam  Constitutional: She is oriented to person, place, and time. She appears well-developed.  HENT:  Head: Normocephalic and atraumatic.  Eyes: EOM are normal.  Neck: Normal range of motion. Neck supple. No tracheal deviation present.  No thyromegaly present.  Cardiovascular: Normal rate and regular rhythm.  Pulmonary/Chest: Effort normal and breath sounds normal.  Abdominal: Soft. Bowel sounds are normal. There is no tenderness. There is no guarding.  Musculoskeletal: Normal range of motion. She exhibits no edema.  Neurological: She is alert and oriented to person, place, and time. She has normal reflexes. No cranial nerve deficit. Coordination normal.  Skin: Skin is warm and dry. No rash noted. No erythema. No pallor.  Psychiatric: She has a normal mood and affect. Judgment normal.    Recent Results (from the past 2160 hour(s))  COMPLETE METABOLIC PANEL WITH GFR     Status: Abnormal   Collection Time: 04/17/17  7:08 AM  Result Value Ref Range   Glucose, Bld 187 (H) 65 - 99 mg/dL    Comment: .            Fasting reference interval . For someone without  known diabetes, a glucose value >125 mg/dL indicates that they may have diabetes and this should be confirmed with a follow-up test. .    BUN 17 7 - 25 mg/dL   Creat 0.69 0.60 - 0.93 mg/dL    Comment: For patients >12 years of age, the reference limit for Creatinine is approximately 13% higher for people identified as African-American. .    GFR, Est Non African American 85 > OR = 60 mL/min/1.53m   GFR, Est African American 98 > OR = 60 mL/min/1.715m  BUN/Creatinine Ratio NOT APPLICABLE 6 - 22 (calc)   Sodium 140 135 - 146 mmol/L   Potassium 4.5 3.5 - 5.3 mmol/L   Chloride 105 98 - 110 mmol/L   CO2 29 20 - 32 mmol/L   Calcium 9.3 8.6 - 10.4 mg/dL   Total Protein 6.6 6.1 - 8.1 g/dL   Albumin 3.7 3.6 - 5.1 g/dL   Globulin 2.9 1.9 - 3.7 g/dL (calc)   AG Ratio 1.3 1.0 - 2.5 (calc)   Total Bilirubin 1.3 (H) 0.2 - 1.2 mg/dL   Alkaline phosphatase (APISO) 82 33 - 130 U/L   AST 15 10 - 35 U/L   ALT 31 (H) 6 - 29 U/L  Hemoglobin A1c     Status: Abnormal   Collection Time: 04/17/17  7:08 AM  Result Value Ref Range   Hgb A1c MFr Bld 8.0 (H) <5.7 % of total Hgb    Comment: For someone without known diabetes, a hemoglobin A1c value of 6.5% or greater indicates that they may have  diabetes and this should be confirmed with a follow-up  test. . For someone with known diabetes, a value <7% indicates  that their diabetes is well controlled and a value  greater than or equal to 7% indicates suboptimal  control. A1c targets should be individualized based on  duration of diabetes, age, comorbid conditions, and  other considerations. . Currently, no consensus exists regarding use of hemoglobin A1c for diagnosis of diabetes for children. .    Mean Plasma Glucose 183 (calc)   eAG (mmol/L) 10.1 (calc)  Lipid panel     Status: None   Collection Time: 04/17/17  7:08 AM  Result Value Ref Range   Cholesterol 140 <200 mg/dL   HDL 69 >50 mg/dL   Triglycerides 58 <150 mg/dL   LDL  Cholesterol (Calc) 58 mg/dL (calc)    Comment: Reference range: <100 . Desirable range <100 mg/dL for primary prevention;   <70 mg/dL for patients with CHD or diabetic patients  with > or =  2 CHD risk factors. Marland Kitchen LDL-C is now calculated using the Browe-Hopkins  calculation, which is a validated novel method providing  better accuracy than the Friedewald equation in the  estimation of LDL-C.  Cresenciano Genre et al. Annamaria Helling. 1607;371(06): 2061-2068  (http://education.[facility].com/faq/FAQ164)    Total CHOL/HDL Ratio 2.0 <5.0 (calc)   Non-HDL Cholesterol (Calc) 71 <130 mg/dL (calc)    Comment: For patients with diabetes plus 1 major ASCVD risk  factor, treating to a non-HDL-C goal of <100 mg/dL  (LDL-C of <70 mg/dL) is considered a therapeutic  option.   POCT glucose (manual entry)     Status: Abnormal   Collection Time: 06/17/17 11:20 AM  Result Value Ref Range   POC Glucose 131 (A) 70 - 99 mg/dl    Comment: 3 hrs PC       Assessment & Plan:   1. Diabetes mellitus associated with pancreatic disease [facility])  - Patient has currently uncontrolled,  Symptomatic, Pancreatic diabetes since age 18, due to [place]  Procedure in September 2016 at [facility] due to mucinous cystic lesions of the pancreatic head- removal of the pancreatic head- pancreaticoduodenectomy. - She came before her scheduled appointment concerned about blood glucose readings in the 70s.  Her average blood glucose in the last 90 days is 209, however she is very concerned about some rare readings below 100.   - her recent A1c was better at 8% improving from 9.6%.  -She is using her CGM sensor . -She did not bring her logs to review her insulin injection activities. -There is no documented hypoglycemia.    - Kerrin Mo Baker remains at a high risk for more acute and chronic complications which include CAD, CVA, CKD, retinopathy, and neuropathy. These are all discussed in detail with the  patient.  - I have counseled her on diet management by adopting a carbohydrate restricted/protein rich diet.  -  Suggestion is made for her to avoid simple carbohydrates  from her diet including Cakes, Sweet Desserts / Pastries, Ice Cream, Soda (diet and regular), Sweet Tea, Candies, Chips, Cookies, Store Bought Juices, Alcohol in Excess of  1-2 drinks a day, Artificial Sweeteners, and "Sugar-free" Products. This will help patient to have stable blood glucose profile and potentially avoid unintended weight gain.  - I encouraged her to switch to  unprocessed or minimally processed complex starch and increased protein intake (animal or plant source), fruits, and vegetables.  - she is advised to stick to a routine mealtimes to eat 3 meals  a day and avoid unnecessary snacks ( to snack only to correct hypoglycemia).    - I have approached her with the following individualized plan to manage diabetes and patient agrees:   -She is still struggling with the fact that she has to take multiple daily injections of insulin to treat her diabetes. - Given her history of pancreatectomy, and her current presentation with  postprandial hyperglycemia, she will need basal/bolus insulin to treat her diabetes to target.  - #1 goal in her treatment of diabetes would be avoiding hypoglycemia which she is rightly concerned about, however, detailed review of her CGM device did not reveal any hypoglycemia. -Her CGM monitor will be of big help avoiding hypoglycemia and controlling reasonable glucose profile.  -It will take some time for her to get comfortable using this device. -I have explained the importance of this CGM machine, the importance of the basal insulin to avoid diabetic ketoacidosis and adjustment of her prandial insulin to  keep her blood glucose in the safe range.  -I advised her to continue Toujeo  10 units daily at bedtime,   Humalog to 4-6 units 3 times a day before meals for pre-meal blood glucose  above 90 mg/dL. - She will continue to use her CGM to document blood glucose 4 times a day-before meals for insulin dosing purposes and at bedtime. -Patient is encouraged to call clinic for blood glucose levels less than 70 or above 300 mg /dl.   - Insulin is  her exclusive choice of therapy  to treat her diabetes. - she is not a candidate for metformin, SGLT2 inhibitors, nor incretin therapy .  - Patient specific target  A1c;  LDL, HDL, Triglycerides, and  Waist Circumference were discussed in detail.  2) BP/HTN: Her blood pressure is controlled to target.   3) Lipids/HPL: Controlled with LDL at 58.  4)  exocrine pancreatic insufficiency: She  is benefiting from Creon therapy, however she reports inconsistency taking this medication.  - I advised her to continue  Creon 24,000 units, 1 with Meals and Snacks, she reports symptomatic improvement with this medication. - Her celiac screen is negative, I advised her to follow gluten-regular diet.  5) Chronic Care/Health Maintenance:  -she  is not  on ACEI/ARB and Statin medications and  is encouraged to continue to follow up with Ophthalmology, Dentist,  Podiatrist at least yearly or according to recommendations, and advised to   stay away from smoking. I have recommended yearly flu vaccine and pneumonia vaccination at least every 5 years, and  sleep for at least 7 hours a day.    - I advised patient to maintain close follow up with Sinda Du, MD for primary care needs.  Follow up plan: - Return keep her last appointment, for follow up with pre-visit labs, meter, and logs.  Glade Lloyd, MD Phone: [phone]  Fax: [phone]   06/23/2017, 1:30 PM This note was partially dictated with voice recognition software. Similar sounding words can be transcribed inadequately or may not  be corrected upon review.

## 2017-06-30 DIAGNOSIS — L820 Inflamed seborrheic keratosis: Secondary | ICD-10-CM | POA: Diagnosis not present

## 2017-07-09 DIAGNOSIS — R21 Rash and other nonspecific skin eruption: Secondary | ICD-10-CM | POA: Diagnosis not present

## 2017-07-09 DIAGNOSIS — E119 Type 2 diabetes mellitus without complications: Secondary | ICD-10-CM | POA: Diagnosis not present

## 2017-07-09 DIAGNOSIS — F419 Anxiety disorder, unspecified: Secondary | ICD-10-CM | POA: Diagnosis not present

## 2017-07-13 ENCOUNTER — Ambulatory Visit (INDEPENDENT_AMBULATORY_CARE_PROVIDER_SITE_OTHER): Payer: Medicare Other | Admitting: "Endocrinology

## 2017-07-13 DIAGNOSIS — E1165 Type 2 diabetes mellitus with hyperglycemia: Secondary | ICD-10-CM

## 2017-07-13 NOTE — Progress Notes (Signed)
Pt brings in her Freestyle Libretoday.Shehas the 14day reader and sensors. Went over instructions on how to use the [place] system. Also went over instruction on how to properly apply the sensor to herarm. Pt does not yet feel comfortable replacing the sensor herself yet.  Libre sensor was placed on the back of pts upper right arm. Shevoices understanding on how to use the system and how to get BG reading for insulin injections and as needed. Shewas advised to contact the office if he has any questions or concerns.

## 2017-07-14 DIAGNOSIS — L820 Inflamed seborrheic keratosis: Secondary | ICD-10-CM | POA: Diagnosis not present

## 2017-07-14 DIAGNOSIS — L853 Xerosis cutis: Secondary | ICD-10-CM | POA: Diagnosis not present

## 2017-07-15 ENCOUNTER — Other Ambulatory Visit: Payer: Self-pay | Admitting: "Endocrinology

## 2017-07-20 DIAGNOSIS — E1169 Type 2 diabetes mellitus with other specified complication: Secondary | ICD-10-CM | POA: Diagnosis not present

## 2017-07-20 DIAGNOSIS — K869 Disease of pancreas, unspecified: Secondary | ICD-10-CM | POA: Diagnosis not present

## 2017-07-20 LAB — COMPLETE METABOLIC PANEL WITH GFR
AG Ratio: 1.3 (calc) (ref 1.0–2.5)
ALBUMIN MSPROF: 3.8 g/dL (ref 3.6–5.1)
ALKALINE PHOSPHATASE (APISO): 89 U/L (ref 33–130)
ALT: 47 U/L — ABNORMAL HIGH (ref 6–29)
AST: 41 U/L — ABNORMAL HIGH (ref 10–35)
BUN: 16 mg/dL (ref 7–25)
CALCIUM: 9.2 mg/dL (ref 8.6–10.4)
CO2: 28 mmol/L (ref 20–32)
CREATININE: 0.7 mg/dL (ref 0.60–0.93)
Chloride: 106 mmol/L (ref 98–110)
GFR, EST AFRICAN AMERICAN: 98 mL/min/{1.73_m2} (ref >=60)
GFR, EST NON AFRICAN AMERICAN: 84 mL/min/{1.73_m2} (ref >=60)
GLUCOSE: 178 mg/dL — AB (ref 65–99)
Globulin: 2.9 g/dL (calc) (ref 1.9–3.7)
Potassium: 3.8 mmol/L (ref 3.5–5.3)
Sodium: 139 mmol/L (ref 135–146)
TOTAL PROTEIN: 6.7 g/dL (ref 6.1–8.1)
Total Bilirubin: 1.2 mg/dL (ref 0.2–1.2)

## 2017-07-20 LAB — HEMOGLOBIN A1C
HEMOGLOBIN A1C: 8 %{Hb} — AB (ref <5.7)
Mean Plasma Glucose: 183 (calc)
eAG (mmol/L): 10.1 (calc)

## 2017-07-21 DIAGNOSIS — M1712 Unilateral primary osteoarthritis, left knee: Secondary | ICD-10-CM | POA: Diagnosis not present

## 2017-07-23 ENCOUNTER — Encounter: Payer: Medicare Other | Attending: Pulmonary Disease | Admitting: Nutrition

## 2017-07-23 ENCOUNTER — Ambulatory Visit (INDEPENDENT_AMBULATORY_CARE_PROVIDER_SITE_OTHER): Payer: Medicare Other | Admitting: "Endocrinology

## 2017-07-23 ENCOUNTER — Encounter: Payer: Self-pay | Admitting: Nutrition

## 2017-07-23 ENCOUNTER — Encounter: Payer: Self-pay | Admitting: "Endocrinology

## 2017-07-23 ENCOUNTER — Ambulatory Visit: Payer: Medicare Other | Admitting: "Endocrinology

## 2017-07-23 VITALS — BP 134/72 | HR 66 | Ht 59.5 in | Wt 142.0 lb

## 2017-07-23 DIAGNOSIS — E1169 Type 2 diabetes mellitus with other specified complication: Secondary | ICD-10-CM | POA: Diagnosis not present

## 2017-07-23 DIAGNOSIS — K869 Disease of pancreas, unspecified: Secondary | ICD-10-CM

## 2017-07-23 DIAGNOSIS — E108 Type 1 diabetes mellitus with unspecified complications: Secondary | ICD-10-CM

## 2017-07-23 DIAGNOSIS — K8681 Exocrine pancreatic insufficiency: Secondary | ICD-10-CM | POA: Diagnosis not present

## 2017-07-23 DIAGNOSIS — E1165 Type 2 diabetes mellitus with hyperglycemia: Secondary | ICD-10-CM | POA: Diagnosis not present

## 2017-07-23 DIAGNOSIS — E118 Type 2 diabetes mellitus with unspecified complications: Secondary | ICD-10-CM | POA: Diagnosis not present

## 2017-07-23 DIAGNOSIS — IMO0002 Reserved for concepts with insufficient information to code with codable children: Secondary | ICD-10-CM

## 2017-07-23 DIAGNOSIS — E1065 Type 1 diabetes mellitus with hyperglycemia: Secondary | ICD-10-CM

## 2017-07-23 NOTE — Progress Notes (Signed)
  Medical Nutrition Therapy:  Appt start time: 1000 end time:  1030 Assessment:  Primary concerns today: Diabetes Type 1-due to pancreatic cancer. She is here with a female friend of hers.. She sees Dr. Dorris Fetch, Endocrinology.  Elenor Legato brought in and down loaded. Testing 4+ times per day. Elenor Legato shows BS above target but better overall BG. A1C 8%-same as last time. Has had some low bloods sugars in the 50-60's. . She reports of being scared of low blood sugars. BS tend to drop if she is dancing or doing any yardwork.  Currently on Toujejo 10 units a day and 4-7 units of Humalog with meals    Eating better carb balanced meals for breakfast but may not be getting enough CHO at lunch and dinner. Feels better overall. Is taking pancreatic enzymes. Has gained about 20 lbs in the last 6 months and feels so much better.  Needs reinforcement and on going support to help her understand the need and use of insulin to manage her diabetes.  Lab Results  Component Value Date   HGBA1C 8.0 (H) 07/20/2017   Preferred Learning Style:    No preference indicated   Learning Readiness:     Ready  Change in progress   MEDICATIONS:   DIETARY INTAKE:   B) Grilled chicken,  2 eggs and 1 slice toast 1 packet of grits and 1/2 banana L) Tomatoes, 1/2, green beans, carrots, grilled chicken with little rice, unsweet tea D)tomtotes, green, beans, carrots, chicken, and little rice and unsweet tea Usual physical activity: ADL  Estimated energy needs: 1600  calories 180  g carbohydrates 120 g protein 44 g fat  Progress Towards Goal(s):  In progress.   Nutritional Diagnosis:  NB-1.1 Food and nutrition-related knowledge deficit As related to Diabetes.  As evidenced by A1C 9.6% secondary to pancreatic cancer.    Intervention:  Nutrition and Diabetes education provided on My Plate, CHO counting, meal planning, portion sizes, timing of meals, avoiding snacks between meals unless having a low blood sugar, target  ranges for A1C and blood sugars, signs/symptoms and treatment of hyper/hypoglycemia, monitoring blood sugars, taking medications as prescribed, benefits of exercising 30 minutes per day and prevention of complications of DM.  Goals  Keep up the good work! Eat 2-3 carb choices per meal Eat protein with all meals Keep hard candy or juice with you at all times. Call DR. Nida if BS are less than 70 twice in  A row or over 300 2 times in a row. Keep drinking water   Teaching Method Utilized:  Visual Auditory Hands on  Handouts given during visit include:  The Plate Method  Meal Plan Card  Diabetes Instructions.   Barriers to learning/adherence to lifestyle change: none  Demonstrated degree of understanding via:  Teach Back   Monitoring/Evaluation:  Dietary intake, exercise, meal planning, SBG, and body weight in 3 month(s).  Would recommend to screen/treat for possible depression.

## 2017-07-23 NOTE — Patient Instructions (Signed)
Keep up the good work! Eat 2-3 carb choices per meal Eat protein with all meals Keep hard candy or juice with you at all times. Call DR. Nida if BS are less than 70 twice in  A row or over 300 2 times in a row. Keep drinking water

## 2017-07-23 NOTE — Progress Notes (Signed)
Subjective:    Patient ID: Betty Nguyen, female    DOB: 1940-10-19.  she is being seen in follow-up for management of currently uncontrolled symptomatic diabetes requested by  Sinda Du, MD.   Past Medical History:  Diagnosis Date  . Anxiety   . Anxiety and depression   . Asthma   . Breast carcinoma ([place]) 08/2009   Invasive ductal; left; lumpectomy and sentinel node excision- oncology visits yearly.  . Chest pain   . Depression   . Diabetes mellitus type II, controlled ([place])    diet controlled,exercise, no meds  . Diverticulitis 2012   Treated medically  . Gastroesophageal reflux   . Hyperlipidemia   . Nephrolithiasis   . Tick bite    [place] Tick, alpha gal positive, makes her allergic to meat   Past Surgical History:  Procedure Laterality Date  . BREAST LUMPECTOMY     Left  . CESAREAN SECTION    . CHOLECYSTECTOMY    . COLONOSCOPY  2004  . DILATION AND CURETTAGE OF UTERUS    . EUS N/A 09/07/2014   Procedure: UPPER ENDOSCOPIC ULTRASOUND (EUS) LINEAR;  Surgeon: Milus Banister, MD;  Location: [facility];  Service: Endoscopy;  Laterality: N/A;  . NEPHROLITHOTOMY    . TUBAL LIGATION    . WHIPPLE PROCEDURE     Social History   Socioeconomic History  . Marital status: Divorced    Spouse name: Not on file  . Number of children: 2  . Years of education: Not on file  . Highest education level: Not on file  Occupational History  . Occupation: Tourist information centre manager for [employer]  Social Needs  . Financial resource strain: Not on file  . Food insecurity:    Worry: Not on file    Inability: Not on file  . Transportation needs:    Medical: Not on file    Non-medical: Not on file  Tobacco Use  . Smoking status: Never Smoker  . Smokeless tobacco: Never Used  Substance and Sexual Activity  . Alcohol use: No  . Drug use: No  . Sexual activity: Not on file  Lifestyle  . Physical activity:    Days per week: Not on file    Minutes per session: Not on file  . Stress: Not on file   Relationships  . Social connections:    Talks on phone: Not on file    Gets together: Not on file    Attends religious service: Not on file    Active member of club or organization: Not on file    Attends meetings of clubs or organizations: Not on file    Relationship status: Not on file  Other Topics Concern  . Not on file  Social History Narrative  . Not on file   Outpatient Encounter Medications as of 07/23/2017  Medication Sig  . albuterol (PROVENTIL HFA;VENTOLIN HFA) 108 (90 BASE) MCG/ACT inhaler Inhale 2 puffs into the lungs every 6 (six) hours as needed for wheezing.  . Blood Glucose Monitoring Suppl (ONETOUCH VERIO) w/Device KIT 1 each by Does not apply route as needed.  . Continuous Blood Gluc Sensor (FREESTYLE LIBRE SENSOR SYSTEM) MISC Use one sensor every 10 days.  Marland Kitchen dicyclomine (BENTYL) 10 MG capsule Take 1 tablet once or twice daily as needed for abdominal pain and gas.  . docusate sodium (COLACE) 100 MG capsule Take 100 mg by mouth daily.  . fluticasone furoate-vilanterol (BREO ELLIPTA) 200-25 MCG/INH AEPB Inhale 1 puff into the lungs daily.  Marland Kitchen  glucose blood (FORA BLOOD GLUCOSE TEST) test strip Use as instructed  . glycerin adult 2 g suppository Place 1 suppository rectally as needed for constipation.  . Insulin Glargine (TOUJEO MAX SOLOSTAR) 300 UNIT/ML SOPN Inject 10 Units into the skin at bedtime.  . insulin lispro (HUMALOG KWIKPEN) 100 UNIT/ML KiwkPen Inject 4-7 Units into the skin 3 (three) times daily before meals.  . Insulin Pen Needle (BD PEN NEEDLE NANO U/F) 32G X 4 MM MISC 1 each by Does not apply route 4 (four) times daily.  Marland Kitchen omeprazole (PRILOSEC) 20 MG capsule Take 1 capsule (20 mg total) by mouth daily.  . Pancrelipase, Lip-Prot-Amyl, (CREON) 24000-76000 units CPEP Use 1 capsule with meals and snacks upto 6 a day  . Potassium Chloride (KLOR-CON 10 PO) Take by mouth 2 (two) times daily.  . psyllium (METAMUCIL SMOOTH TEXTURE) 28 % packet Take 1 packet by mouth  2 (two) times daily.  Nelva Nay SOLOSTAR 300 UNIT/ML SOPN INJECT 12 UNITS INTO THE SKIN AT BEDTIME  . valACYclovir (VALTREX) 500 MG tablet Take 500 mg by mouth daily as needed (outbreak).   . [DISCONTINUED] HUMALOG KWIKPEN 100 UNIT/ML KiwkPen INJECT 4 TO 10 UNITS INTO THE SKIN 3 TIMES DAILY WITH MEALS.   No facility-administered encounter medications on file as of 07/23/2017.     ALLERGIES: Allergies  Allergen Reactions  . Biaxin [Clarithromycin] Other (See Comments)    Hallucinations  . Carafate [Sucralfate] Other (See Comments)    Flared up her vertigo, run her blood sugar up and caused constipation  . Codeine Other (See Comments)    Hallucinations   . Doxycycline Other (See Comments)    GI upset , turned stool orange  . Metronidazole Other (See Comments)    Heart Palpations -flagyl  . Cephalosporins Hives and Palpitations    VACCINATION STATUS:  There is no immunization history on file for this patient.  Diabetes  She presents for her follow-up diabetic visit. She has type 1 (Diabetes is due to pancreatectomy) diabetes mellitus. Onset time: She was diagnosed at approximate age of 42. Her disease course has been improving. There are no hypoglycemic associated symptoms. Pertinent negatives for hypoglycemia include no confusion, headaches, pallor or seizures. Pertinent negatives for diabetes include no blurred vision, no chest pain, no fatigue, no polydipsia, no polyphagia and no polyuria. There are no hypoglycemic complications. Symptoms are improving. There are no diabetic complications. Risk factors for coronary artery disease include diabetes mellitus, post-menopausal, sedentary lifestyle and family history. Current diabetic treatment includes intensive insulin program. Her weight is increasing steadily. She is following a generally unhealthy diet. When asked about meal planning, she reported none. She has had a previous visit with a dietitian. She never participates in exercise. Her  home blood glucose trend is fluctuating minimally. (She came with her Libre device and blood glucose logs showing 34% time in range and 66% above target mainly postprandial hyperglycemia.) An ACE inhibitor/angiotensin II receptor blocker is not being taken. She does not see a podiatrist.Eye exam is not current.    Review of Systems  Constitutional: Negative for chills, fatigue, fever and unexpected weight change.  HENT: Negative for trouble swallowing and voice change.   Eyes: Negative for blurred vision and visual disturbance.  Respiratory: Negative for cough, shortness of breath and wheezing.   Cardiovascular: Negative for chest pain, palpitations and leg swelling.  Gastrointestinal: Positive for diarrhea. Negative for nausea and vomiting.  Endocrine: Negative for cold intolerance, heat intolerance, polydipsia, polyphagia and polyuria.  Musculoskeletal:  Negative for arthralgias and myalgias.  Skin: Negative for color change, pallor, rash and wound.  Neurological: Negative for seizures and headaches.  Psychiatric/Behavioral: Negative for confusion and suicidal ideas.    Objective:    BP 134/72   Pulse 66   Ht 4' 11.5" (1.511 m)   Wt 142 lb (64.4 kg)   BMI 28.20 kg/m   Wt Readings from Last 3 Encounters:  07/23/17 142 lb (64.4 kg)  06/23/17 133 lb (60.3 kg)  06/17/17 138 lb (62.6 kg)     Physical Exam  Constitutional: She is oriented to person, place, and time. She appears well-developed.  HENT:  Head: Normocephalic and atraumatic.  Eyes: EOM are normal.  Neck: Normal range of motion. Neck supple. No tracheal deviation present. No thyromegaly present.  Cardiovascular: Normal rate.  Pulmonary/Chest: Effort normal.  Abdominal: Bowel sounds are normal. There is no tenderness. There is no guarding.  Musculoskeletal: Normal range of motion. She exhibits no edema.  Neurological: She is alert and oriented to person, place, and time. She has normal reflexes. No cranial nerve deficit.  Coordination normal.  Skin: Skin is warm and dry. No rash noted. No erythema. No pallor.  Psychiatric: She has a normal mood and affect. Judgment normal.    Recent Results (from the past 2160 hour(s))  POCT glucose (manual entry)     Status: Abnormal   Collection Time: 06/17/17 11:20 AM  Result Value Ref Range   POC Glucose 131 (A) 70 - 99 mg/dl    Comment: 3 hrs PC  COMPLETE METABOLIC PANEL WITH GFR     Status: Abnormal   Collection Time: 07/20/17  7:59 AM  Result Value Ref Range   Glucose, Bld 178 (H) 65 - 99 mg/dL    Comment: .            Fasting reference interval . For someone without known diabetes, a glucose value >125 mg/dL indicates that they may have diabetes and this should be confirmed with a follow-up test. .    BUN 16 7 - 25 mg/dL   Creat 0.70 0.60 - 0.93 mg/dL    Comment: For patients >61 years of age, the reference limit for Creatinine is approximately 13% higher for people identified as African-American. .    GFR, Est Non African American 84 > OR = 60 mL/min/1.99m   GFR, Est African American 98 > OR = 60 mL/min/1.785m  BUN/Creatinine Ratio NOT APPLICABLE 6 - 22 (calc)   Sodium 139 135 - 146 mmol/L   Potassium 3.8 3.5 - 5.3 mmol/L   Chloride 106 98 - 110 mmol/L   CO2 28 20 - 32 mmol/L   Calcium 9.2 8.6 - 10.4 mg/dL   Total Protein 6.7 6.1 - 8.1 g/dL   Albumin 3.8 3.6 - 5.1 g/dL   Globulin 2.9 1.9 - 3.7 g/dL (calc)   AG Ratio 1.3 1.0 - 2.5 (calc)   Total Bilirubin 1.2 0.2 - 1.2 mg/dL   Alkaline phosphatase (APISO) 89 33 - 130 U/L   AST 41 (H) 10 - 35 U/L   ALT 47 (H) 6 - 29 U/L  Hemoglobin A1c     Status: Abnormal   Collection Time: 07/20/17  7:59 AM  Result Value Ref Range   Hgb A1c MFr Bld 8.0 (H) <5.7 % of total Hgb    Comment: For someone without known diabetes, a hemoglobin A1c value of 6.5% or greater indicates that they may have  diabetes and this should be confirmed with  a follow-up  test. . For someone with known diabetes, a value <7%  indicates  that their diabetes is well controlled and a value  greater than or equal to 7% indicates suboptimal  control. A1c targets should be individualized based on  duration of diabetes, age, comorbid conditions, and  other considerations. . Currently, no consensus exists regarding use of hemoglobin A1c for diagnosis of diabetes for children. .    Mean Plasma Glucose 183 (calc)   eAG (mmol/L) 10.1 (calc)       Assessment & Plan:   1. Diabetes mellitus associated with pancreatic disease [facility])  - Patient has currently uncontrolled,  Symptomatic, Pancreatic diabetes since age 38, due to [place]ipple  Procedure in September 2016 at [facility] due to mucinous cystic lesions of the pancreatic head- removal of the pancreatic head- pancreaticoduodenectomy. - She came with her Libre device and glucose readings showing 34% time in range and 66% above target.   -She has actively been avoiding dropping blood glucose below 200.     Her average blood glucose in the last 90 days is 214, she did not have hypoglycemia in the last 90 days.  However she worries about hypoglycemia.   -Her recent A1c remains at 8%, after generally improving from 9.6%.    -She is using her CGM sensor . - Betty Nguyen remains at a high risk for more acute and chronic complications which include CAD, CVA, CKD, retinopathy, and neuropathy. These are all discussed in detail with the patient.  - I have counseled her on diet management by adopting a carbohydrate restricted/protein rich diet.  -  Suggestion is made for her to avoid simple carbohydrates  from her diet including Cakes, Sweet Desserts / Pastries, Ice Cream, Soda (diet and regular), Sweet Tea, Candies, Chips, Cookies, Store Bought Juices, Alcohol in Excess of  1-2 drinks a day, Artificial Sweeteners, and "Sugar-free" Products. This will help patient to have stable blood glucose profile and potentially avoid unintended weight gain.   - I  encouraged her to switch to  unprocessed or minimally processed complex starch and increased protein intake (animal or plant source), fruits, and vegetables.  - she is advised to stick to a routine mealtimes to eat 3 meals  a day and avoid unnecessary snacks ( to snack only to correct hypoglycemia).    - I have approached her with the following individualized plan to manage diabetes and patient agrees:   -She is still struggling with the fact that she has to take multiple daily injections of insulin to treat her diabetes. - Given her history of pancreatectomy, and her current presentation with  postprandial hyperglycemia, she will continue to need intensive treatment with basal/bolus insulin in order for her to control diabetes to target.   - #1 goal in her treatment of diabetes would be avoiding hypoglycemia which she is rightly concerned about, however, detailed review of her CGM device did not reveal any hypoglycemia. -Her CGM monitor will be of big help avoiding hypoglycemia and controlling reasonable glucose profile.  -It will take some time for her to get comfortable using this device. -I have explained the importance of this CGM machine, the importance of the basal insulin to avoid diabetic ketoacidosis and adjustment of her prandial insulin to keep her blood glucose in the safe range.  -I advised her to continue Toujeo  10 units daily at bedtime,   Humalog to 4-7 units 3 times a day before meals for pre-meal blood  glucose above 90 mg/dL. - She will continue to use her CGM to document blood glucose 4 times a day-before meals for insulin dosing purposes and at bedtime. -Patient is encouraged to call clinic for blood glucose levels less than 70 or above 300 mg /dl.   - Insulin is  her exclusive choice of therapy  to treat her diabetes. - she is not a candidate for metformin, SGLT2 inhibitors, nor incretin therapy .  - Patient specific target  A1c;  LDL, HDL, Triglycerides, and  Waist  Circumference were discussed in detail.  2) BP/HTN: Her blood pressure is controlled to target.      3) Lipids/HPL: Controlled with LDL at 58.  4)  exocrine pancreatic insufficiency: She  is benefiting from Creon therapy, however she reports inconsistency taking this medication.  - I advised her to continue  Creon 24,000 units, 1 with Meals and Snacks, she reports symptomatic improvement with this medication. - Her celiac screen is negative, I advised her to follow gluten-regular diet.  5) Chronic Care/Health Maintenance:  -she  is not  on ACEI/ARB and Statin medications and  is encouraged to continue to follow up with Ophthalmology, Dentist,  Podiatrist at least yearly or according to recommendations, and advised to   stay away from smoking. I have recommended yearly flu vaccine and pneumonia vaccination at least every 5 years, and  sleep for at least 7 hours a day.  - I advised patient to maintain close follow up with Sinda Du, MD for primary care needs. - Time spent with the patient: 25 min, of which >50% was spent in reviewing her blood glucose logs , discussing her hypo- and hyper-glycemic episodes, reviewing her current and  previous labs and insulin doses and developing a plan to avoid hypo- and hyper-glycemia. Please refer to Patient Instructions for Blood Glucose Monitoring and Insulin/Medications Dosing Guide"  in media tab for additional information. Betty Nguyen participated in the discussions, expressed understanding, and voiced agreement with the above plans.  All questions were answered to her satisfaction. she is encouraged to contact clinic should she have any questions or concerns prior to her return visit.  Follow up plan: - Return in about 3 months (around 10/23/2017) for meter, and logs.  Glade Lloyd, MD Phone: [phone]  Fax: [phone]   07/23/2017, 10:49 AM This note was partially dictated with voice recognition software. Similar sounding words can be  transcribed inadequately or may not  be corrected upon review.

## 2017-07-29 DIAGNOSIS — E7800 Pure hypercholesterolemia, unspecified: Secondary | ICD-10-CM | POA: Diagnosis not present

## 2017-07-29 DIAGNOSIS — E109 Type 1 diabetes mellitus without complications: Secondary | ICD-10-CM | POA: Diagnosis not present

## 2017-07-29 DIAGNOSIS — E1065 Type 1 diabetes mellitus with hyperglycemia: Secondary | ICD-10-CM | POA: Diagnosis not present

## 2017-07-29 DIAGNOSIS — Z794 Long term (current) use of insulin: Secondary | ICD-10-CM | POA: Diagnosis not present

## 2017-08-10 ENCOUNTER — Ambulatory Visit (INDEPENDENT_AMBULATORY_CARE_PROVIDER_SITE_OTHER): Payer: Medicare Other

## 2017-08-10 DIAGNOSIS — E1169 Type 2 diabetes mellitus with other specified complication: Secondary | ICD-10-CM

## 2017-08-10 DIAGNOSIS — K869 Disease of pancreas, unspecified: Secondary | ICD-10-CM

## 2017-08-10 NOTE — Progress Notes (Signed)
Pt brings in herFreestyle Libretoday.Shehas the 14day reader and sensors. Went over instructions on how to use the Libre system. Also went over instruction on how to properly apply the sensor to herarm. Libre sensor was placed on the back of pts upperrightarm. Shevoices understanding on how to use the system and how to get BG reading for insulin injections and as needed. Shewas advised to contact the office if he has any questions or concerns. 

## 2017-08-10 NOTE — Patient Instructions (Signed)
Call with any questions or concerns 

## 2017-08-12 ENCOUNTER — Telehealth: Payer: Self-pay | Admitting: Nutrition

## 2017-08-12 NOTE — Telephone Encounter (Signed)
She called concerned that her BS at 221 pm today was 429 mg/dl. She ate lunch and BS was 155 mg/dl before lunch and she took 5 units of insulin. Ate chicken, greens, tomatoes, cabbage and sweet potato. Wants to know what she should do?  She notes she is really nervous right now because her friend is in the hospital.  Per Dr. Liliane Channel VO, he advised  her to drink water, relax but do not take any extra insulin at this time. Test blood sugar before dinner and use the sliding scale according to premeal blood sugar. Pt. Verbalized understanding.

## 2017-08-19 ENCOUNTER — Other Ambulatory Visit: Payer: Self-pay | Admitting: "Endocrinology

## 2017-08-28 ENCOUNTER — Other Ambulatory Visit: Payer: Self-pay | Admitting: "Endocrinology

## 2017-08-28 MED ORDER — FREESTYLE LIBRE SENSOR SYSTEM MISC: 2 refills | Status: DC

## 2017-09-07 DIAGNOSIS — K432 Incisional hernia without obstruction or gangrene: Secondary | ICD-10-CM | POA: Diagnosis not present

## 2017-09-14 DIAGNOSIS — J45909 Unspecified asthma, uncomplicated: Secondary | ICD-10-CM | POA: Diagnosis not present

## 2017-09-14 DIAGNOSIS — K432 Incisional hernia without obstruction or gangrene: Secondary | ICD-10-CM | POA: Diagnosis not present

## 2017-09-14 DIAGNOSIS — K8681 Exocrine pancreatic insufficiency: Secondary | ICD-10-CM | POA: Diagnosis not present

## 2017-09-14 DIAGNOSIS — E1165 Type 2 diabetes mellitus with hyperglycemia: Secondary | ICD-10-CM | POA: Diagnosis not present

## 2017-09-23 ENCOUNTER — Other Ambulatory Visit: Payer: Self-pay | Admitting: "Endocrinology

## 2017-09-23 DIAGNOSIS — K8681 Exocrine pancreatic insufficiency: Secondary | ICD-10-CM

## 2017-10-01 DIAGNOSIS — K432 Incisional hernia without obstruction or gangrene: Secondary | ICD-10-CM | POA: Diagnosis not present

## 2017-10-07 ENCOUNTER — Other Ambulatory Visit: Payer: Self-pay | Admitting: "Endocrinology

## 2017-10-22 DIAGNOSIS — K869 Disease of pancreas, unspecified: Secondary | ICD-10-CM | POA: Diagnosis not present

## 2017-10-22 DIAGNOSIS — E1169 Type 2 diabetes mellitus with other specified complication: Secondary | ICD-10-CM | POA: Diagnosis not present

## 2017-10-23 ENCOUNTER — Encounter: Payer: Self-pay | Admitting: "Endocrinology

## 2017-10-23 ENCOUNTER — Ambulatory Visit (INDEPENDENT_AMBULATORY_CARE_PROVIDER_SITE_OTHER): Payer: Medicare Other | Admitting: "Endocrinology

## 2017-10-23 VITALS — BP 106/66 | HR 60 | Ht 59.5 in | Wt 147.0 lb

## 2017-10-23 DIAGNOSIS — E1169 Type 2 diabetes mellitus with other specified complication: Secondary | ICD-10-CM | POA: Diagnosis not present

## 2017-10-23 DIAGNOSIS — K869 Disease of pancreas, unspecified: Secondary | ICD-10-CM

## 2017-10-23 DIAGNOSIS — K8681 Exocrine pancreatic insufficiency: Secondary | ICD-10-CM

## 2017-10-23 LAB — COMPLETE METABOLIC PANEL WITH GFR
AG RATIO: 1.2 (calc) (ref 1.0–2.5)
ALT: 29 U/L (ref 6–29)
AST: 17 U/L (ref 10–35)
Albumin: 3.6 g/dL (ref 3.6–5.1)
Alkaline phosphatase (APISO): 85 U/L (ref 33–130)
BILIRUBIN TOTAL: 1 mg/dL (ref 0.2–1.2)
BUN: 15 mg/dL (ref 7–25)
CALCIUM: 9 mg/dL (ref 8.6–10.4)
CHLORIDE: 106 mmol/L (ref 98–110)
CO2: 27 mmol/L (ref 20–32)
Creat: 0.71 mg/dL (ref 0.60–0.93)
GFR, EST NON AFRICAN AMERICAN: 83 mL/min/{1.73_m2} (ref >=60)
GFR, Est African American: 96 mL/min/{1.73_m2} (ref >=60)
GLUCOSE: 153 mg/dL — AB (ref 65–99)
Globulin: 3 g/dL (calc) (ref 1.9–3.7)
POTASSIUM: 4.1 mmol/L (ref 3.5–5.3)
Sodium: 140 mmol/L (ref 135–146)
TOTAL PROTEIN: 6.6 g/dL (ref 6.1–8.1)

## 2017-10-23 LAB — HEMOGLOBIN A1C
HEMOGLOBIN A1C: 7.3 %{Hb} — AB (ref <5.7)
Mean Plasma Glucose: 163 (calc)
eAG (mmol/L): 9 (calc)

## 2017-10-23 NOTE — Progress Notes (Signed)
Subjective:    Patient ID: Betty Nguyen, female    DOB: 1941/01/07.  she is being seen in follow-up for management of currently uncontrolled symptomatic diabetes requested by  Sinda Du, MD.   Past Medical History:  Diagnosis Date  . Anxiety   . Anxiety and depression   . Asthma   . Breast carcinoma ([place]) 08/2009   Invasive ductal; left; lumpectomy and sentinel node excision- oncology visits yearly.  . Chest pain   . Depression   . Diabetes mellitus type II, controlled ([place])    diet controlled,exercise, no meds  . Diverticulitis 2012   Treated medically  . Gastroesophageal reflux   . Hyperlipidemia   . Nephrolithiasis   . Tick bite    [place] Tick, alpha gal positive, makes her allergic to meat   Past Surgical History:  Procedure Laterality Date  . BREAST LUMPECTOMY     Left  . CESAREAN SECTION    . CHOLECYSTECTOMY    . COLONOSCOPY  2004  . DILATION AND CURETTAGE OF UTERUS    . EUS N/A 09/07/2014   Procedure: UPPER ENDOSCOPIC ULTRASOUND (EUS) LINEAR;  Surgeon: Milus Banister, MD;  Location: [facility];  Service: Endoscopy;  Laterality: N/A;  . NEPHROLITHOTOMY    . TUBAL LIGATION    . WHIPPLE PROCEDURE     Social History   Socioeconomic History  . Marital status: Divorced    Spouse name: Not on file  . Number of children: 2  . Years of education: Not on file  . Highest education level: Not on file  Occupational History  . Occupation: Tourist information centre manager for [employer]  Social Needs  . Financial resource strain: Not on file  . Food insecurity:    Worry: Not on file    Inability: Not on file  . Transportation needs:    Medical: Not on file    Non-medical: Not on file  Tobacco Use  . Smoking status: Never Smoker  . Smokeless tobacco: Never Used  Substance and Sexual Activity  . Alcohol use: No  . Drug use: No  . Sexual activity: Not on file  Lifestyle  . Physical activity:    Days per week: Not on file    Minutes per session: Not on file  . Stress: Not on file   Relationships  . Social connections:    Talks on phone: Not on file    Gets together: Not on file    Attends religious service: Not on file    Active member of club or organization: Not on file    Attends meetings of clubs or organizations: Not on file    Relationship status: Not on file  Other Topics Concern  . Not on file  Social History Narrative  . Not on file   Outpatient Encounter Medications as of 10/23/2017  Medication Sig  . albuterol (PROVENTIL HFA;VENTOLIN HFA) 108 (90 BASE) MCG/ACT inhaler Inhale 2 puffs into the lungs every 6 (six) hours as needed for wheezing.  . Blood Glucose Monitoring Suppl (ONETOUCH VERIO) w/Device KIT 1 each by Does not apply route as needed.  . Continuous Blood Gluc Sensor (FREESTYLE LIBRE SENSOR SYSTEM) MISC Use one sensor every 10 days.  Marland Kitchen dicyclomine (BENTYL) 10 MG capsule Take 1 tablet once or twice daily as needed for abdominal pain and gas.  . docusate sodium (COLACE) 100 MG capsule Take 100 mg by mouth daily.  . fluticasone furoate-vilanterol (BREO ELLIPTA) 200-25 MCG/INH AEPB Inhale 1 puff into the lungs daily.  Marland Kitchen  GLOBAL EASE INJECT PEN NEEDLES 32G X 4 MM MISC USE 4 TIMES DAILY AS DIRECTED BY MD.  . glucose blood (FORA BLOOD GLUCOSE TEST) test strip Use as instructed  . glycerin adult 2 g suppository Place 1 suppository rectally as needed for constipation.  . Insulin Glargine (TOUJEO MAX SOLOSTAR) 300 UNIT/ML SOPN Inject 10 Units into the skin at bedtime.  . insulin lispro (HUMALOG KWIKPEN) 100 UNIT/ML KiwkPen Inject 4-7 Units into the skin 3 (three) times daily before meals.  Marland Kitchen omeprazole (PRILOSEC) 20 MG capsule Take 1 capsule (20 mg total) by mouth daily.  . Pancrelipase, Lip-Prot-Amyl, (CREON) 24000-76000 units CPEP TAKE 1 CAPSULE WITH MEALS AND SNACKS UP TO 6 A DAY  . Potassium Chloride (KLOR-CON 10 PO) Take by mouth 2 (two) times daily.  . psyllium (METAMUCIL SMOOTH TEXTURE) 28 % packet Take 1 packet by mouth 2 (two) times daily.  Nelva Nay SOLOSTAR 300 UNIT/ML SOPN INJECT 12 UNITS INTO THE SKIN AT BEDTIME  . valACYclovir (VALTREX) 500 MG tablet Take 500 mg by mouth daily as needed (outbreak).    No facility-administered encounter medications on file as of 10/23/2017.     ALLERGIES: Allergies  Allergen Reactions  . Biaxin [Clarithromycin] Other (See Comments)    Hallucinations  . Carafate [Sucralfate] Other (See Comments)    Flared up her vertigo, run her blood sugar up and caused constipation  . Codeine Other (See Comments)    Hallucinations   . Doxycycline Other (See Comments)    GI upset , turned stool orange  . Metronidazole Other (See Comments)    Heart Palpations -flagyl  . Cephalosporins Hives and Palpitations    VACCINATION STATUS:  There is no immunization history on file for this patient.  Diabetes  She presents for her follow-up diabetic visit. She has type 1 (Diabetes is due to pancreatectomy) diabetes mellitus. Onset time: She was diagnosed at approximate age of 54. Her disease course has been improving. There are no hypoglycemic associated symptoms. Pertinent negatives for hypoglycemia include no confusion, headaches, pallor or seizures. Pertinent negatives for diabetes include no blurred vision, no chest pain, no fatigue, no polydipsia, no polyphagia and no polyuria. There are no hypoglycemic complications. Symptoms are improving. There are no diabetic complications. Risk factors for coronary artery disease include diabetes mellitus, post-menopausal, sedentary lifestyle and family history. Current diabetic treatment includes intensive insulin program. Her weight is increasing steadily. She is following a generally unhealthy diet. When asked about meal planning, she reported none. She has had a previous visit with a dietitian. She never participates in exercise. Her home blood glucose trend is fluctuating minimally. Her breakfast blood glucose range is generally 140-180 mg/dl. Her lunch blood glucose range  is generally 140-180 mg/dl. Her dinner blood glucose range is generally 140-180 mg/dl. Her bedtime blood glucose range is generally 140-180 mg/dl. Her overall blood glucose range is 140-180 mg/dl. (She came with her Libre device and blood glucose logs showing 37% time in range and 63% above target mainly postprandial hyperglycemia. She has 0% hypoglycemia , but she complains of " a lot of ' drops. Her average BG recently is 211.) An ACE inhibitor/angiotensin II receptor blocker is not being taken. She does not see a podiatrist.Eye exam is not current.    Review of Systems  Constitutional: Negative for chills, fatigue, fever and unexpected weight change.  HENT: Negative for trouble swallowing and voice change.   Eyes: Negative for blurred vision and visual disturbance.  Respiratory: Negative for cough, shortness  of breath and wheezing.   Cardiovascular: Negative for chest pain, palpitations and leg swelling.  Gastrointestinal: Positive for diarrhea. Negative for nausea and vomiting.  Endocrine: Negative for cold intolerance, heat intolerance, polydipsia, polyphagia and polyuria.  Musculoskeletal: Negative for arthralgias and myalgias.  Skin: Negative for color change, pallor, rash and wound.  Neurological: Negative for seizures and headaches.  Psychiatric/Behavioral: Negative for confusion and suicidal ideas.    Objective:    BP 106/66   Pulse 60   Ht 4' 11.5" (1.511 m)   Wt 147 lb (66.7 kg)   BMI 29.19 kg/m   Wt Readings from Last 3 Encounters:  10/23/17 147 lb (66.7 kg)  08/10/17 142 lb (64.4 kg)  07/23/17 142 lb (64.4 kg)     Physical Exam  Constitutional: She is oriented to person, place, and time. She appears well-developed.  HENT:  Head: Normocephalic and atraumatic.  Eyes: EOM are normal.  Neck: Normal range of motion. Neck supple. No tracheal deviation present. No thyromegaly present.  Cardiovascular: Normal rate.  Pulmonary/Chest: Effort normal.  Abdominal: Bowel sounds  are normal. There is no tenderness. There is no guarding.  Musculoskeletal: Normal range of motion. She exhibits no edema.  Neurological: She is alert and oriented to person, place, and time. She has normal reflexes. No cranial nerve deficit. Coordination normal.  Skin: Skin is warm and dry. No rash noted. No erythema. No pallor.  Psychiatric: She has a normal mood and affect. Judgment normal.    Recent Results (from the past 2160 hour(s))  COMPLETE METABOLIC PANEL WITH GFR     Status: Abnormal   Collection Time: 10/22/17  7:25 AM  Result Value Ref Range   Glucose, Bld 153 (H) 65 - 99 mg/dL    Comment: .            Fasting reference interval . For someone without known diabetes, a glucose value >125 mg/dL indicates that they may have diabetes and this should be confirmed with a follow-up test. .    BUN 15 7 - 25 mg/dL   Creat 0.71 0.60 - 0.93 mg/dL    Comment: For patients >63 years of age, the reference limit for Creatinine is approximately 13% higher for people identified as African-American. .    GFR, Est Non African American 83 > OR = 60 mL/min/1.59m   GFR, Est African American 96 > OR = 60 mL/min/1.79m  BUN/Creatinine Ratio NOT APPLICABLE 6 - 22 (calc)   Sodium 140 135 - 146 mmol/L   Potassium 4.1 3.5 - 5.3 mmol/L   Chloride 106 98 - 110 mmol/L   CO2 27 20 - 32 mmol/L   Calcium 9.0 8.6 - 10.4 mg/dL   Total Protein 6.6 6.1 - 8.1 g/dL   Albumin 3.6 3.6 - 5.1 g/dL   Globulin 3.0 1.9 - 3.7 g/dL (calc)   AG Ratio 1.2 1.0 - 2.5 (calc)   Total Bilirubin 1.0 0.2 - 1.2 mg/dL   Alkaline phosphatase (APISO) 85 33 - 130 U/L   AST 17 10 - 35 U/L   ALT 29 6 - 29 U/L  Hemoglobin A1c     Status: Abnormal   Collection Time: 10/22/17  7:25 AM  Result Value Ref Range   Hgb A1c MFr Bld 7.3 (H) <5.7 % of total Hgb    Comment: For someone without known diabetes, a hemoglobin A1c value of 6.5% or greater indicates that they may have  diabetes and this should be confirmed with a  follow-up  test. . For someone with known diabetes, a value <7% indicates  that their diabetes is well controlled and a value  greater than or equal to 7% indicates suboptimal  control. A1c targets should be individualized based on  duration of diabetes, age, comorbid conditions, and  other considerations. . Currently, no consensus exists regarding use of hemoglobin A1c for diagnosis of diabetes for children. .    Mean Plasma Glucose 163 (calc)   eAG (mmol/L) 9.0 (calc)       Assessment & Plan:   1. Diabetes mellitus associated with pancreatic disease [facility])  - Patient has currently uncontrolled,  Symptomatic, Pancreatic diabetes since age 40, due to Whipple  Procedure in September 2016 at [facility] due to mucinous cystic lesions of the pancreatic head- removal of the pancreatic head- pancreaticoduodenectomy. - She came with her Libre device and glucose readings showing 37% time in range and 63% above target.   -She has actively been avoiding dropping blood glucose below 200.     Her average blood glucose in the last 90 days is 214, she did not have hypoglycemia in the last 90 days.  However she worries about hypoglycemia.   -Her recent A1c is improving to 7.3% , improving from 9.6%.    -She is using her CGM sensor . - Kiva S Baker remains at a high risk for more acute and chronic complications which include CAD, CVA, CKD, retinopathy, and neuropathy. These are all discussed in detail with the patient.  - I have counseled her on diet management by adopting a carbohydrate restricted/protein rich diet.  -  Suggestion is made for her to avoid simple carbohydrates  from her diet including Cakes, Sweet Desserts / Pastries, Ice Cream, Soda (diet and regular), Sweet Tea, Candies, Chips, Cookies, Store Bought Juices, Alcohol in Excess of  1-2 drinks a day, Artificial Sweeteners, and "Sugar-free" Products. This will help patient to have stable blood glucose profile  and potentially avoid unintended weight gain.   - I encouraged her to switch to  unprocessed or minimally processed complex starch and increased protein intake (animal or plant source), fruits, and vegetables.  - she is advised to stick to a routine mealtimes to eat 3 meals  a day and avoid unnecessary snacks ( to snack only to correct hypoglycemia).    - I have approached her with the following individualized plan to manage diabetes and patient agrees:   -She is still struggling with the fact that she has to take multiple daily injections of insulin to treat her diabetes. - Given her history of pancreatectomy, and her current presentation with  postprandial hyperglycemia, she will continue to need intensive treatment with basal/bolus insulin in order for her to control diabetes to target.   - #1 goal in her treatment of diabetes would be avoiding hypoglycemia which she is rightly concerned about, however, detailed review of her CGM device did not reveal any hypoglycemia. -Her CGM monitor will be of big help avoiding hypoglycemia and controlling reasonable glucose profile.  -It will take some time for her to get comfortable using this device. -I have explained the importance of this CGM machine, the importance of the basal insulin to avoid diabetic ketoacidosis and adjustment of her prandial insulin to keep her blood glucose in the safe range.  -I advised her to continue Toujeo  10 units daily at bedtime,   Humalog to 4-6 units 3 times a day before meals for pre-meal blood glucose above 90 mg/dL. -  She will continue to use her CGM to document blood glucose 4 times a day-before meals for insulin dosing purposes and at bedtime. -Patient is encouraged to call clinic for blood glucose levels less than 70 or above 300 mg /dl.   - Insulin is  her exclusive choice of therapy  to treat her diabetes. - she is not a candidate for metformin, SGLT2 inhibitors, nor incretin therapy .  - Patient specific  target  A1c;  LDL, HDL, Triglycerides, and  Waist Circumference were discussed in detail.  2) BP/HTN: Her blood pressure is controlled to target.      3) Lipids/HPL: Controlled with LDL at 58.  4)  exocrine pancreatic insufficiency: She  is benefiting from Creon therapy, however she reports inconsistency taking this medication.  - I advised her to continue  Creon 24,000 units, 1 with Meals and Snacks, she reports symptomatic improvement with this medication. - Her celiac screen is negative, I advised her to follow gluten-regular diet.  5) Chronic Care/Health Maintenance:  -she  is not  on ACEI/ARB and Statin medications and  is encouraged to continue to follow up with Ophthalmology, Dentist,  Podiatrist at least yearly or according to recommendations, and advised to   stay away from smoking. I have recommended yearly flu vaccine and pneumonia vaccination at least every 5 years, and  sleep for at least 7 hours a day.  - I advised patient to maintain close follow up with Sinda Du, MD for primary care needs.   - Time spent with the patient: 25 min, of which >50% was spent in reviewing her blood glucose logs , discussing her hypo- and hyper-glycemic episodes, reviewing her current and  previous labs and insulin doses and developing a plan to avoid hypo- and hyper-glycemia. Please refer to Patient Instructions for Blood Glucose Monitoring and Insulin/Medications Dosing Guide"  in media tab for additional information. Betty Nguyen participated in the discussions, expressed understanding, and voiced agreement with the above plans.  All questions were answered to her satisfaction. she is encouraged to contact clinic should she have any questions or concerns prior to her return visit.   Follow up plan: - Return in about 3 months (around 01/23/2018) for follow up with pre-visit labs, meter, and logs.  Glade Lloyd, MD Phone: [phone]  Fax: [phone]   10/23/2017, 2:16 PM This note was  partially dictated with voice recognition software. Similar sounding words can be transcribed inadequately or may not  be corrected upon review.

## 2017-10-23 NOTE — Patient Instructions (Signed)

## 2017-11-04 ENCOUNTER — Ambulatory Visit: Payer: Medicare Other | Admitting: Nutrition

## 2017-11-09 ENCOUNTER — Other Ambulatory Visit: Payer: Self-pay | Admitting: "Endocrinology

## 2017-11-16 DIAGNOSIS — J4520 Mild intermittent asthma, uncomplicated: Secondary | ICD-10-CM | POA: Diagnosis not present

## 2017-11-16 DIAGNOSIS — K8681 Exocrine pancreatic insufficiency: Secondary | ICD-10-CM | POA: Diagnosis not present

## 2017-11-16 DIAGNOSIS — J301 Allergic rhinitis due to pollen: Secondary | ICD-10-CM | POA: Diagnosis not present

## 2017-11-16 DIAGNOSIS — E1165 Type 2 diabetes mellitus with hyperglycemia: Secondary | ICD-10-CM | POA: Diagnosis not present

## 2017-11-26 DIAGNOSIS — K219 Gastro-esophageal reflux disease without esophagitis: Secondary | ICD-10-CM | POA: Diagnosis not present

## 2017-11-26 DIAGNOSIS — J45909 Unspecified asthma, uncomplicated: Secondary | ICD-10-CM | POA: Diagnosis not present

## 2017-11-26 DIAGNOSIS — K432 Incisional hernia without obstruction or gangrene: Secondary | ICD-10-CM | POA: Diagnosis not present

## 2017-11-26 DIAGNOSIS — E119 Type 2 diabetes mellitus without complications: Secondary | ICD-10-CM | POA: Diagnosis not present

## 2017-11-30 ENCOUNTER — Other Ambulatory Visit: Payer: Self-pay

## 2017-11-30 ENCOUNTER — Telehealth: Payer: Self-pay | Admitting: *Deleted

## 2017-11-30 MED ORDER — GLUCOSE BLOOD VI STRP: 1.0000 | ORAL_STRIP | Freq: Four times a day (QID) | 2 refills | Status: DC

## 2017-11-30 NOTE — Telephone Encounter (Signed)
[facility] has faxed a request for dicyclomine 10 mg today 11-30-2017. I noted on the request for the patient to call our office.  She has not been seen here since 09-2016.  Faxed it back to [employer]

## 2017-12-02 DIAGNOSIS — K219 Gastro-esophageal reflux disease without esophagitis: Secondary | ICD-10-CM | POA: Diagnosis present

## 2017-12-02 DIAGNOSIS — K432 Incisional hernia without obstruction or gangrene: Secondary | ICD-10-CM | POA: Diagnosis present

## 2017-12-02 DIAGNOSIS — E119 Type 2 diabetes mellitus without complications: Secondary | ICD-10-CM | POA: Diagnosis present

## 2017-12-02 DIAGNOSIS — J45909 Unspecified asthma, uncomplicated: Secondary | ICD-10-CM | POA: Diagnosis present

## 2017-12-02 DIAGNOSIS — G8918 Other acute postprocedural pain: Secondary | ICD-10-CM | POA: Diagnosis not present

## 2017-12-17 ENCOUNTER — Encounter: Payer: Self-pay | Admitting: Gastroenterology

## 2017-12-22 IMAGING — CT CT ABD-PELV W/ CM
2 of 5 series · 15 of 46 positions shown, 17 images · IV contrast (iopamidol)
Comparison: CT abdomen pelvis 05/04/2015

CLINICAL DATA: Abdominal pain.  Whipple 9 months previous

EXAM:
CT ABDOMEN AND PELVIS WITH CONTRAST
TECHNIQUE: Multidetector CT imaging of the abdomen and pelvis was performed
using the standard protocol following bolus administration of
intravenous contrast.
CONTRAST:  100mL 69KVAM-6MM IOPAMIDOL (69KVAM-6MM) INJECTION 61%

[Series 2: routine abd pel with · axial · 0.66mm/px · z∈[-439,-54]mm · 12 of 89 slices shown, 14 images]
[im 6/89  soft-tissue]
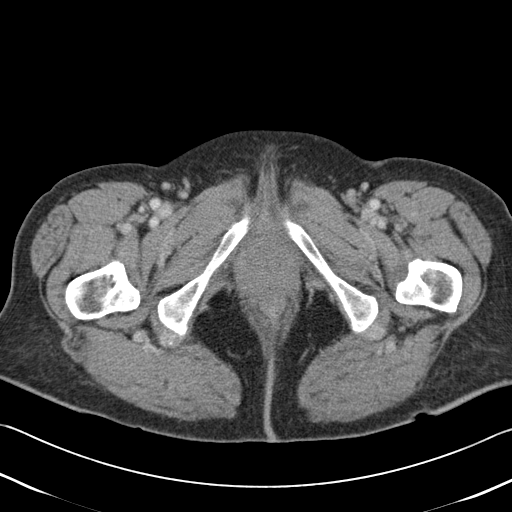
[im 6/89  bone]
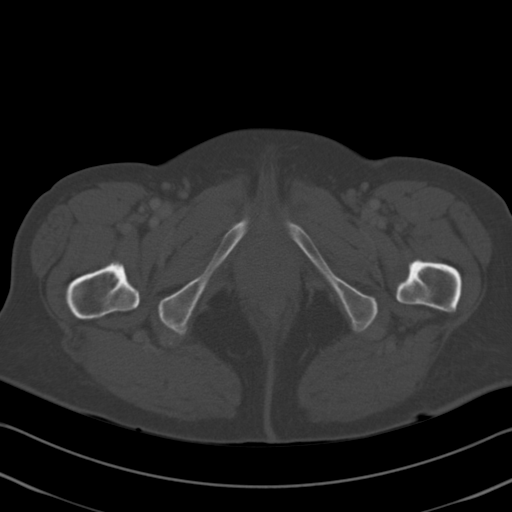
[im 16/89  soft-tissue]
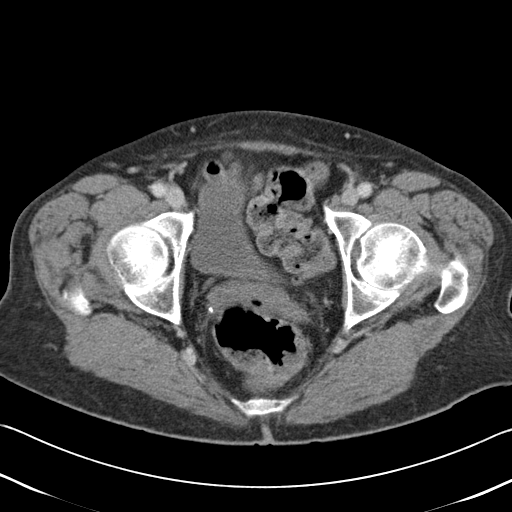
[im 21/89  soft-tissue]
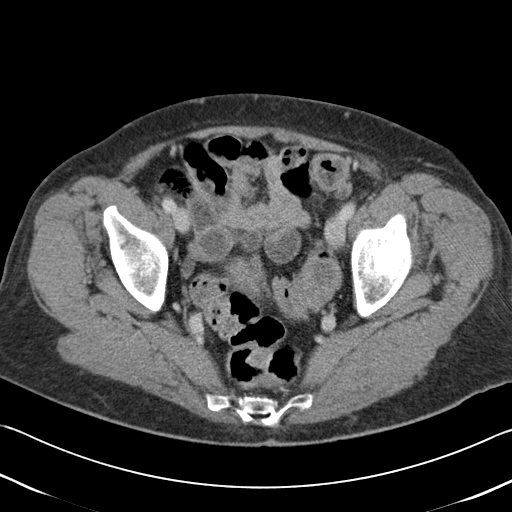
[im 26/89  soft-tissue]
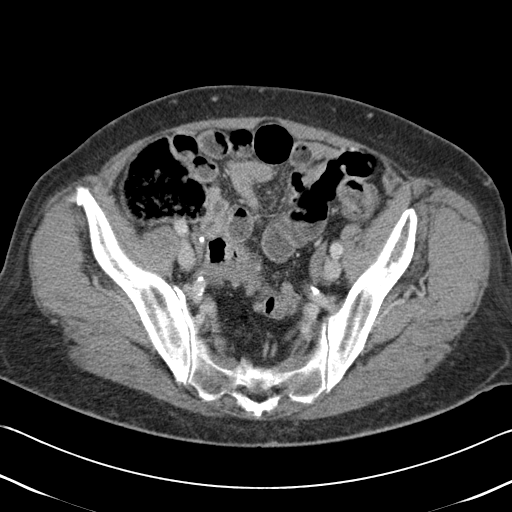
[im 37/89  soft-tissue]
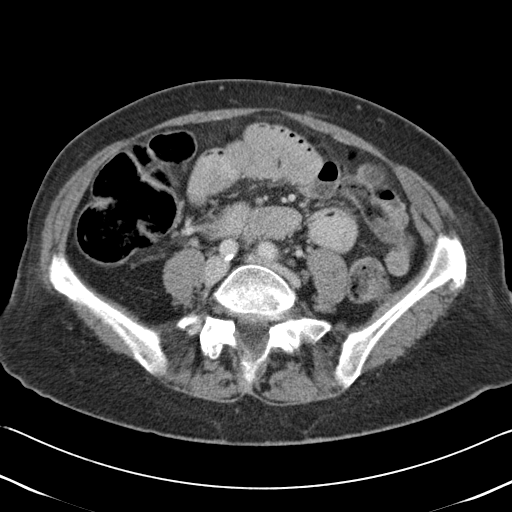
[im 42/89  soft-tissue]
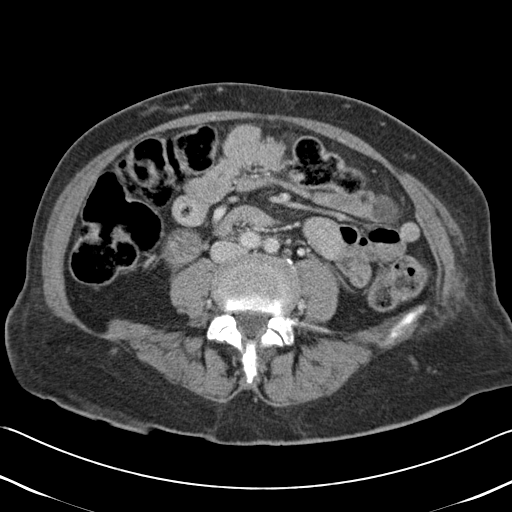
[im 47/89  soft-tissue]
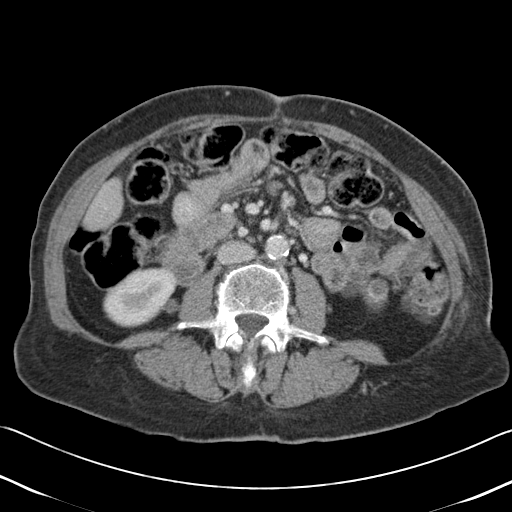
[im 57/89  soft-tissue]
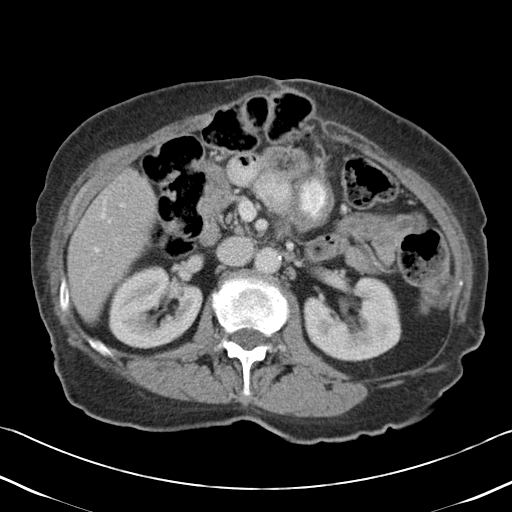
[im 63/89  soft-tissue]
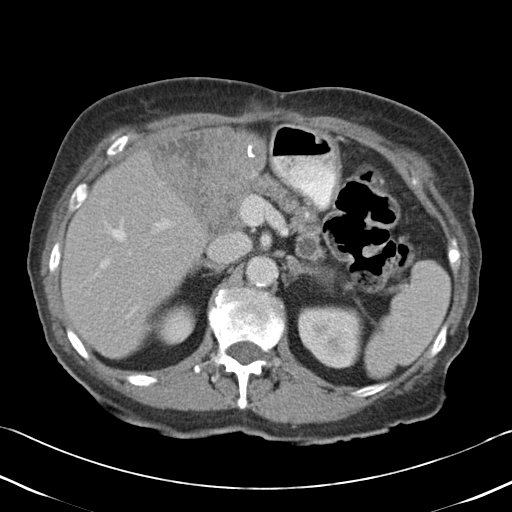
[im 63/89  bone]
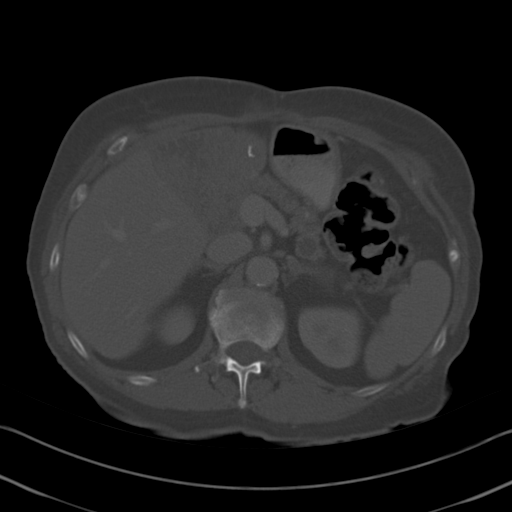
[im 68/89  soft-tissue]
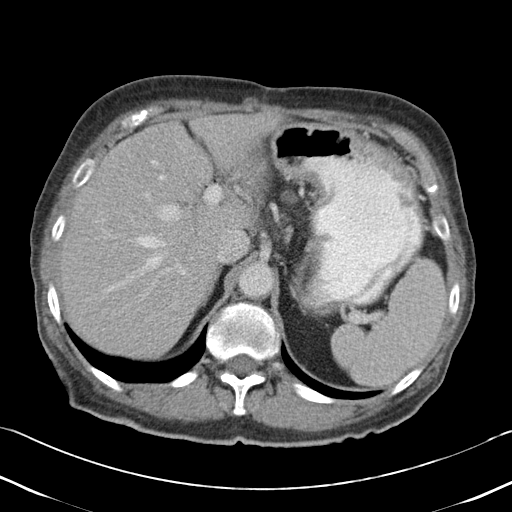
[im 78/89  soft-tissue]
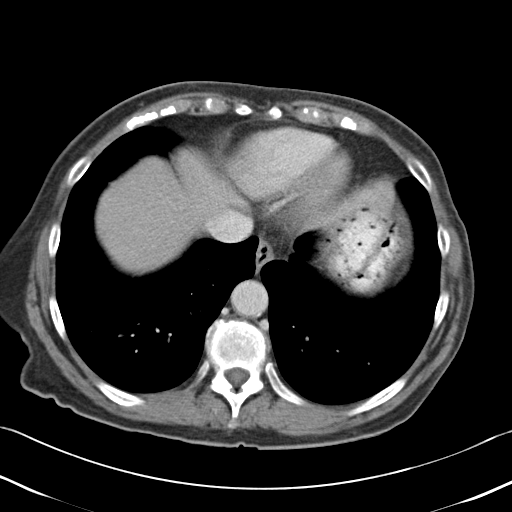
[im 83/89  soft-tissue]
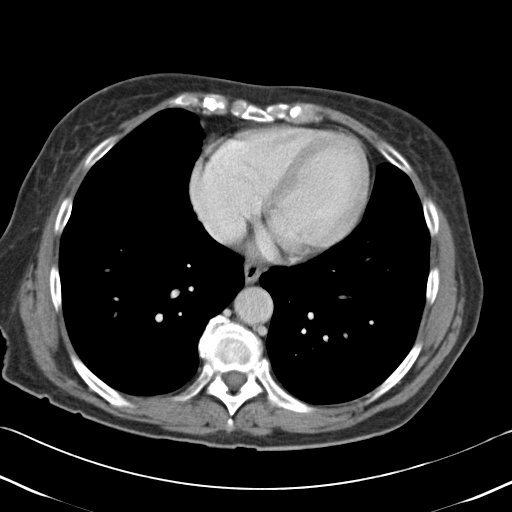

[Series 5: coronal · coronal · 0.64mm/px · 3 of 48 slices shown]
[im 16/48  soft-tissue]
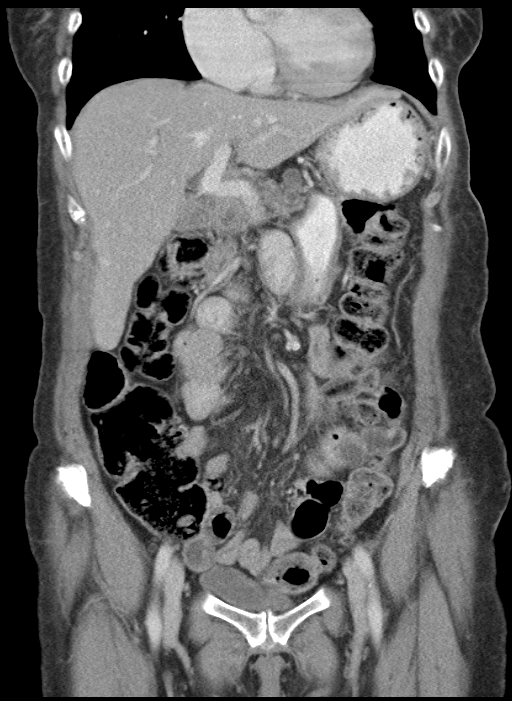
[im 21/48  soft-tissue]
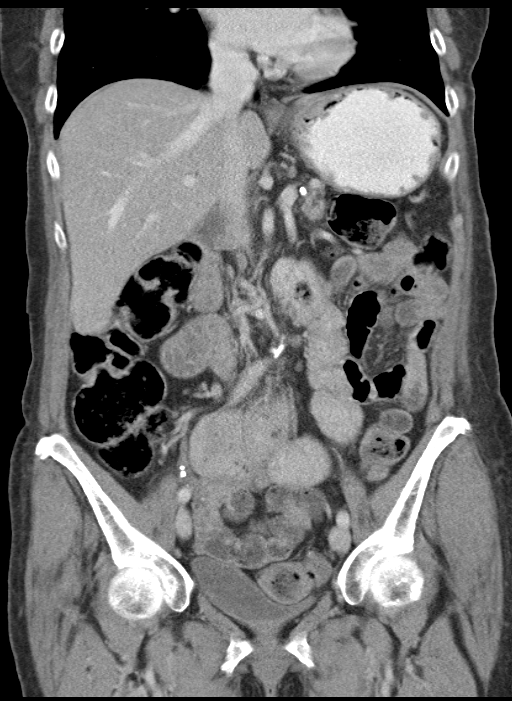
[im 27/48  soft-tissue]
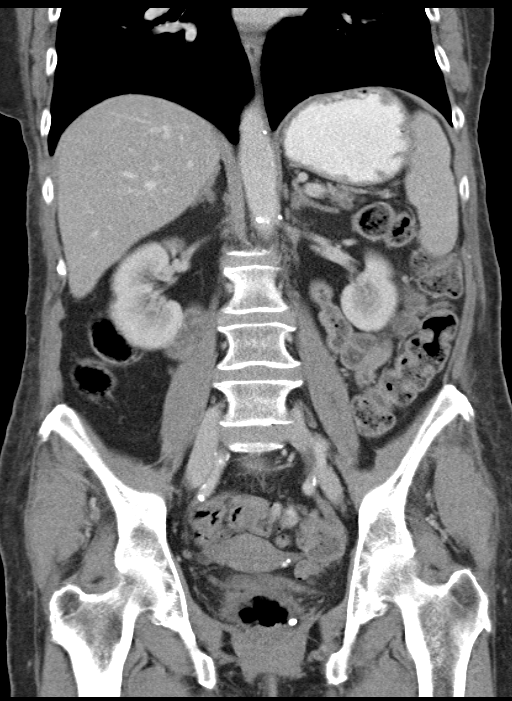

[15 of 46 positions shown; findings below may reference images not displayed]

FINDINGS: Lower chest: Lung bases clear without infiltrate or effusion. Heart
size within normal limits.

Hepatobiliary: Postop cholecystectomy. Small amount of gas in the
porta hepatis compatible with pneumobilia. Small bowel loops extend
up to the porta hepatis compatible with choledocho jejunostomy. No
biliary dilatation.

Cystic lesion in the left lobe liver measuring 20 x 28 mm unchanged
from prior studies. This does not appear to enhance significantly.
No other liver lesion identified.

Pancreas: Atrophic changes the pancreas with pancreatic ductal
dilatation. 12 mm cyst in the tail of the pancreas is stable.
Multiple additional pancreatic cysts are stable. Resection of the
pancreatic head cystic mass. No evidence of acute pancreatitis.

Spleen: Negative

Adrenals/Urinary Tract: No renal mass or obstruction. No urinary
tract calculi. Urinary bladder normal.

Stomach/Bowel: Prior Whipple procedure. Small bowel loops in the
right upper abdomen most consistent with choledochojejunostomy.
Negative for bowel obstruction. No bowel edema.

Vascular/Lymphatic: Mild atherosclerotic disease in the aorta and
iliac arteries without aneurysm. No lymphadenopathy.

Reproductive: Hysterectomy.  No pelvic mass

Other: Ventral hernia in the midline containing transverse colon. No
bowel edema. Hernia has developed since the prior CT.

Musculoskeletal: No acute skeletal abnormality.
IMPRESSION: Postop Whipple procedure. Small amount of pneumobilia most
compatible with choledochal jejunostomy. No biliary dilatation.

Negative for bowel obstruction.

Chronic atrophy and cystic change in the pancreas stable from prior
studies. Interval resection of cyst in the pancreatic head.

Stable liver cyst

Ventral hernia in the midline epigastrium containing transverse
colon.

## 2017-12-24 DIAGNOSIS — K432 Incisional hernia without obstruction or gangrene: Secondary | ICD-10-CM | POA: Diagnosis not present

## 2018-01-01 ENCOUNTER — Other Ambulatory Visit: Payer: Self-pay | Admitting: "Endocrinology

## 2018-01-04 ENCOUNTER — Ambulatory Visit (HOSPITAL_COMMUNITY)
Admission: RE | Admit: 2018-01-04 | Discharge: 2018-01-04 | Disposition: A | Discharge status: Home / Self Care | Payer: Medicare Other | Source: Ambulatory Visit | Attending: Pulmonary Disease | Admitting: Pulmonary Disease

## 2018-01-04 ENCOUNTER — Other Ambulatory Visit (HOSPITAL_COMMUNITY): Payer: Self-pay | Admitting: Pulmonary Disease

## 2018-01-04 DIAGNOSIS — F419 Anxiety disorder, unspecified: Secondary | ICD-10-CM | POA: Diagnosis not present

## 2018-01-04 DIAGNOSIS — J45909 Unspecified asthma, uncomplicated: Secondary | ICD-10-CM | POA: Diagnosis not present

## 2018-01-04 DIAGNOSIS — R0602 Shortness of breath: Secondary | ICD-10-CM | POA: Diagnosis not present

## 2018-01-04 DIAGNOSIS — R05 Cough: Secondary | ICD-10-CM | POA: Diagnosis not present

## 2018-01-04 DIAGNOSIS — J209 Acute bronchitis, unspecified: Secondary | ICD-10-CM | POA: Diagnosis not present

## 2018-01-04 DIAGNOSIS — J40 Bronchitis, not specified as acute or chronic: Secondary | ICD-10-CM | POA: Diagnosis not present

## 2018-01-04 DIAGNOSIS — R079 Chest pain, unspecified: Secondary | ICD-10-CM | POA: Diagnosis not present

## 2018-01-04 DIAGNOSIS — R059 Cough, unspecified: Secondary | ICD-10-CM

## 2018-01-11 DIAGNOSIS — J4531 Mild persistent asthma with (acute) exacerbation: Secondary | ICD-10-CM | POA: Diagnosis not present

## 2018-01-11 DIAGNOSIS — Z23 Encounter for immunization: Secondary | ICD-10-CM | POA: Diagnosis not present

## 2018-01-11 DIAGNOSIS — K8681 Exocrine pancreatic insufficiency: Secondary | ICD-10-CM | POA: Diagnosis not present

## 2018-01-11 DIAGNOSIS — E119 Type 2 diabetes mellitus without complications: Secondary | ICD-10-CM | POA: Diagnosis not present

## 2018-01-11 DIAGNOSIS — F419 Anxiety disorder, unspecified: Secondary | ICD-10-CM | POA: Diagnosis not present

## 2018-01-27 DIAGNOSIS — E1169 Type 2 diabetes mellitus with other specified complication: Secondary | ICD-10-CM | POA: Diagnosis not present

## 2018-01-27 DIAGNOSIS — K869 Disease of pancreas, unspecified: Secondary | ICD-10-CM | POA: Diagnosis not present

## 2018-01-28 LAB — COMPLETE METABOLIC PANEL WITH GFR
AG Ratio: 1.3 (calc) (ref 1.0–2.5)
ALBUMIN MSPROF: 3.8 g/dL (ref 3.6–5.1)
ALT: 61 U/L — ABNORMAL HIGH (ref 6–29)
AST: 36 U/L — ABNORMAL HIGH (ref 10–35)
Alkaline phosphatase (APISO): 136 U/L — ABNORMAL HIGH (ref 33–130)
BUN: 12 mg/dL (ref 7–25)
CALCIUM: 9.4 mg/dL (ref 8.6–10.4)
CO2: 27 mmol/L (ref 20–32)
CREATININE: 0.67 mg/dL (ref 0.60–0.93)
Chloride: 103 mmol/L (ref 98–110)
GFR, EST NON AFRICAN AMERICAN: 85 mL/min/{1.73_m2} (ref >=60)
GFR, Est African American: 99 mL/min/{1.73_m2} (ref >=60)
Globulin: 3 g/dL (calc) (ref 1.9–3.7)
Glucose, Bld: 200 mg/dL — ABNORMAL HIGH (ref 65–139)
Potassium: 4.3 mmol/L (ref 3.5–5.3)
SODIUM: 136 mmol/L (ref 135–146)
Total Bilirubin: 0.7 mg/dL (ref 0.2–1.2)
Total Protein: 6.8 g/dL (ref 6.1–8.1)

## 2018-01-28 LAB — HEMOGLOBIN A1C
EAG (MMOL/L): 11.4 (calc)
Hgb A1c MFr Bld: 8.8 % of total Hgb — ABNORMAL HIGH (ref <5.7)
Mean Plasma Glucose: 206 (calc)

## 2018-01-28 LAB — MICROALBUMIN / CREATININE URINE RATIO
CREATININE, URINE: 37 mg/dL (ref 20–275)
MICROALB UR: 0.7 mg/dL
MICROALB/CREAT RATIO: 19 ug/mg{creat} (ref <30)

## 2018-02-01 ENCOUNTER — Encounter: Payer: Self-pay | Admitting: "Endocrinology

## 2018-02-01 ENCOUNTER — Ambulatory Visit (INDEPENDENT_AMBULATORY_CARE_PROVIDER_SITE_OTHER): Payer: Medicare Other | Admitting: "Endocrinology

## 2018-02-01 VITALS — BP 113/72 | HR 73 | Ht 59.5 in | Wt 149.0 lb

## 2018-02-01 DIAGNOSIS — E1169 Type 2 diabetes mellitus with other specified complication: Secondary | ICD-10-CM

## 2018-02-01 DIAGNOSIS — K869 Disease of pancreas, unspecified: Secondary | ICD-10-CM | POA: Diagnosis not present

## 2018-02-01 DIAGNOSIS — K8681 Exocrine pancreatic insufficiency: Secondary | ICD-10-CM

## 2018-02-01 MED ORDER — INSULIN GLARGINE 300 UNIT/ML ~~LOC~~ SOPN: 12.0000 [IU] | PEN_INJECTOR | Freq: Every day | SUBCUTANEOUS | 2 refills | Status: DC

## 2018-02-01 NOTE — Progress Notes (Signed)
Endocrinology follow-up note  Subjective:    Patient ID: Betty Nguyen, female    DOB: January 07, 1941.  she is being seen in follow-up for management of currently uncontrolled symptomatic aortic diabetes, hypertension, hyperlipidemia, exocrine pancreatic insufficiency.    PMD: Sinda Du, MD.   Past Medical History:  Diagnosis Date  . Anxiety   . Anxiety and depression   . Asthma   . Breast carcinoma ([place]) 08/2009   Invasive ductal; left; lumpectomy and sentinel node excision- oncology visits yearly.  . Chest pain   . Depression   . Diabetes mellitus type II, controlled ([place])    diet controlled,exercise, no meds  . Diverticulitis 2012   Treated medically  . Gastroesophageal reflux   . Hyperlipidemia   . Nephrolithiasis   . Tick bite    [place] Tick, alpha gal positive, makes her allergic to meat   Past Surgical History:  Procedure Laterality Date  . BREAST LUMPECTOMY     Left  . CESAREAN SECTION    . CHOLECYSTECTOMY    . COLONOSCOPY  2004  . DILATION AND CURETTAGE OF UTERUS    . EUS N/A 09/07/2014   Procedure: UPPER ENDOSCOPIC ULTRASOUND (EUS) LINEAR;  Surgeon: Milus Banister, MD;  Location: [facility];  Service: Endoscopy;  Laterality: N/A;  . NEPHROLITHOTOMY    . TUBAL LIGATION    . WHIPPLE PROCEDURE     Social History   Socioeconomic History  . Marital status: Divorced    Spouse name: Not on file  . Number of children: 2  . Years of education: Not on file  . Highest education level: Not on file  Occupational History  . Occupation: Tourist information centre manager for [employer]  Social Needs  . Financial resource strain: Not on file  . Food insecurity:    Worry: Not on file    Inability: Not on file  . Transportation needs:    Medical: Not on file    Non-medical: Not on file  Tobacco Use  . Smoking status: Never Smoker  . Smokeless tobacco: Never Used  Substance and Sexual Activity  . Alcohol use: No  . Drug use: No  . Sexual activity: Not on file  Lifestyle  . Physical  activity:    Days per week: Not on file    Minutes per session: Not on file  . Stress: Not on file  Relationships  . Social connections:    Talks on phone: Not on file    Gets together: Not on file    Attends religious service: Not on file    Active member of club or organization: Not on file    Attends meetings of clubs or organizations: Not on file    Relationship status: Not on file  Other Topics Concern  . Not on file  Social History Narrative  . Not on file   Outpatient Encounter Medications as of 02/01/2018  Medication Sig  . albuterol (PROVENTIL HFA;VENTOLIN HFA) 108 (90 BASE) MCG/ACT inhaler Inhale 2 puffs into the lungs every 6 (six) hours as needed for wheezing.  . Blood Glucose Monitoring Suppl (ONETOUCH VERIO) w/Device KIT 1 each by Does not apply route as needed.  . Continuous Blood Gluc Sensor (FREESTYLE LIBRE SENSOR SYSTEM) MISC Use one sensor every 10 days.  Marland Kitchen dicyclomine (BENTYL) 10 MG capsule Take 1 tablet once or twice daily as needed for abdominal pain and gas.  . docusate sodium (COLACE) 100 MG capsule Take 100 mg by mouth daily.  . fluticasone furoate-vilanterol (BREO  ELLIPTA) 200-25 MCG/INH AEPB Inhale 1 puff into the lungs daily.  Marland Kitchen GLOBAL EASE INJECT PEN NEEDLES 32G X 4 MM MISC USE 4 TIMES DAILY AS DIRECTED BY MD.  . glucose blood (FORA BLOOD GLUCOSE TEST) test strip Use as instructed  . glucose blood test strip 1 each by Other route 4 (four) times daily. Use as instructed 4 x daily. E11.65 Freestyle  . glycerin adult 2 g suppository Place 1 suppository rectally as needed for constipation.  Marland Kitchen HUMALOG KWIKPEN 100 UNIT/ML KiwkPen INJECT 4 TO 10 UNITS INTO THE SKIN 3 TIMES DAILY WITH MEALS.  Marland Kitchen Insulin Glargine (TOUJEO MAX SOLOSTAR) 300 UNIT/ML SOPN Inject 12 Units into the skin at bedtime.  Marland Kitchen omeprazole (PRILOSEC) 20 MG capsule Take 1 capsule (20 mg total) by mouth daily.  . Pancrelipase, Lip-Prot-Amyl, (CREON) 24000-76000 units CPEP TAKE 1 CAPSULE WITH MEALS AND  SNACKS UP TO 6 A DAY  . Potassium Chloride (KLOR-CON 10 PO) Take by mouth 2 (two) times daily.  . psyllium (METAMUCIL SMOOTH TEXTURE) 28 % packet Take 1 packet by mouth 2 (two) times daily.  . valACYclovir (VALTREX) 500 MG tablet Take 500 mg by mouth daily as needed (outbreak).   . [DISCONTINUED] Insulin Glargine (TOUJEO MAX SOLOSTAR) 300 UNIT/ML SOPN Inject 10 Units into the skin at bedtime.  . [DISCONTINUED] TOUJEO SOLOSTAR 300 UNIT/ML SOPN INJECT 12 UNITS INTO THE SKIN AT BEDTIME   No facility-administered encounter medications on file as of 02/01/2018.     ALLERGIES: Allergies  Allergen Reactions  . Biaxin [Clarithromycin] Other (See Comments)    Hallucinations  . Carafate [Sucralfate] Other (See Comments)    Flared up her vertigo, run her blood sugar up and caused constipation  . Codeine Other (See Comments)    Hallucinations   . Doxycycline Other (See Comments)    GI upset , turned stool orange  . Metronidazole Other (See Comments)    Heart Palpations -flagyl  . Cephalosporins Hives and Palpitations    VACCINATION STATUS:  There is no immunization history on file for this patient.  Diabetes  She presents for her follow-up diabetic visit. She has type 1 (Diabetes is due to pancreatectomy) diabetes mellitus. Onset time: She was diagnosed at approximate age of 50. Her disease course has been worsening. There are no hypoglycemic associated symptoms. Pertinent negatives for hypoglycemia include no confusion, headaches, pallor or seizures. Pertinent negatives for diabetes include no blurred vision, no chest pain, no fatigue, no polydipsia, no polyphagia and no polyuria. There are no hypoglycemic complications. Symptoms are worsening. There are no diabetic complications. Risk factors for coronary artery disease include diabetes mellitus, post-menopausal, sedentary lifestyle and family history. Current diabetic treatment includes intensive insulin program. Her weight is increasing  steadily. She is following a generally unhealthy diet. When asked about meal planning, she reported none. She has had a previous visit with a dietitian. She never participates in exercise. Her home blood glucose trend is fluctuating minimally. Her breakfast blood glucose range is generally 180-200 mg/dl. Her lunch blood glucose range is generally 180-200 mg/dl. Her dinner blood glucose range is generally 180-200 mg/dl. Her bedtime blood glucose range is generally 180-200 mg/dl. Her overall blood glucose range is 180-200 mg/dl. (She came with her Libre device and blood glucose logs showing 37% time in range and 63% above target mainly postprandial hyperglycemia. She has 0% hypoglycemia , but she complains of " a lot of ' drops. Her average BG recently is 211.) An ACE inhibitor/angiotensin II receptor blocker is  not being taken. She does not see a podiatrist.Eye exam is not current.    Review of Systems  Constitutional: Negative for chills, fatigue, fever and unexpected weight change.  HENT: Negative for trouble swallowing and voice change.   Eyes: Negative for blurred vision and visual disturbance.  Respiratory: Negative for cough, shortness of breath and wheezing.   Cardiovascular: Negative for chest pain, palpitations and leg swelling.  Gastrointestinal: Positive for diarrhea. Negative for nausea and vomiting.  Endocrine: Negative for cold intolerance, heat intolerance, polydipsia, polyphagia and polyuria.  Musculoskeletal: Negative for arthralgias and myalgias.  Skin: Negative for color change, pallor, rash and wound.  Neurological: Negative for seizures and headaches.  Psychiatric/Behavioral: Negative for confusion and suicidal ideas.    Objective:    BP 113/72   Pulse 73   Ht 4' 11.5" (1.511 m)   Wt 149 lb (67.6 kg)   BMI 29.59 kg/m   Wt Readings from Last 3 Encounters:  02/01/18 149 lb (67.6 kg)  10/23/17 147 lb (66.7 kg)  08/10/17 142 lb (64.4 kg)     Physical Exam   Constitutional: She is oriented to person, place, and time. She appears well-developed.  HENT:  Head: Normocephalic and atraumatic.  Eyes: EOM are normal.  Neck: Normal range of motion. Neck supple. No tracheal deviation present. No thyromegaly present.  Cardiovascular: Normal rate.  Pulmonary/Chest: Effort normal.  Abdominal: Bowel sounds are normal. There is no tenderness. There is no guarding.  Musculoskeletal: Normal range of motion. She exhibits no edema.  Neurological: She is alert and oriented to person, place, and time. No cranial nerve deficit. Coordination normal.  Skin: Skin is warm and dry. No rash noted. No erythema. No pallor.  Psychiatric: She has a normal mood and affect. Judgment normal.    Recent Results (from the past 2160 hour(s))  Hemoglobin A1c     Status: Abnormal   Collection Time: 01/27/18  7:33 AM  Result Value Ref Range   Hgb A1c MFr Bld 8.8 (H) <5.7 % of total Hgb    Comment: For someone without known diabetes, a hemoglobin A1c value of 6.5% or greater indicates that they may have  diabetes and this should be confirmed with a follow-up  test. . For someone with known diabetes, a value <7% indicates  that their diabetes is well controlled and a value  greater than or equal to 7% indicates suboptimal  control. A1c targets should be individualized based on  duration of diabetes, age, comorbid conditions, and  other considerations. . Currently, no consensus exists regarding use of hemoglobin A1c for diagnosis of diabetes for children. .    Mean Plasma Glucose 206 (calc)   eAG (mmol/L) 11.4 (calc)  COMPLETE METABOLIC PANEL WITH GFR     Status: Abnormal   Collection Time: 01/27/18  7:33 AM  Result Value Ref Range   Glucose, Bld 200 (H) 65 - 139 mg/dL    Comment: .        Non-fasting reference interval .    BUN 12 7 - 25 mg/dL   Creat 0.67 0.60 - 0.93 mg/dL    Comment: For patients >80 years of age, the reference limit for Creatinine is  approximately 13% higher for people identified as African-American. .    GFR, Est Non African American 85 > OR = 60 mL/min/1.28m   GFR, Est African American 99 > OR = 60 mL/min/1.784m  BUN/Creatinine Ratio NOT APPLICABLE 6 - 22 (calc)   Sodium 136 135 - 146  mmol/L   Potassium 4.3 3.5 - 5.3 mmol/L   Chloride 103 98 - 110 mmol/L   CO2 27 20 - 32 mmol/L   Calcium 9.4 8.6 - 10.4 mg/dL   Total Protein 6.8 6.1 - 8.1 g/dL   Albumin 3.8 3.6 - 5.1 g/dL   Globulin 3.0 1.9 - 3.7 g/dL (calc)   AG Ratio 1.3 1.0 - 2.5 (calc)   Total Bilirubin 0.7 0.2 - 1.2 mg/dL   Alkaline phosphatase (APISO) 136 (H) 33 - 130 U/L   AST 36 (H) 10 - 35 U/L   ALT 61 (H) 6 - 29 U/L  Microalbumin / creatinine urine ratio     Status: None   Collection Time: 01/27/18  7:33 AM  Result Value Ref Range   Creatinine, Urine 37 20 - 275 mg/dL   Microalb, Ur 0.7 mg/dL    Comment: Reference Range Not established    Microalb Creat Ratio 19 <30 mcg/mg creat    Comment: . The ADA defines abnormalities in albumin excretion as follows: Marland Kitchen Category         Result (mcg/mg creatinine) . Normal                    <30 Microalbuminuria         30-299  Clinical albuminuria   > OR = 300 . The ADA recommends that at least two of three specimens collected within a 3-6 month period be abnormal before considering a patient to be within a diagnostic category.        Assessment & Plan:   1. Diabetes mellitus associated with pancreatic disease [facility])  - Patient has currently uncontrolled,  Symptomatic, Pancreatic diabetes since age 55, due to [place]  Procedure in September 2016 [facility] due to mucinous cystic lesions of the pancreatic head- removal of the pancreatic head- pancreaticoduodenectomy. - She came with her Libre device and glucose readings showing 25% time in range and 68% above target.   -She has actively been avoiding dropping blood glucose below 200.     Her average blood glucose in  the last 90 days is  195, she did not have hypoglycemia in the last 90 days.  However she worries about hypoglycemia.   -Her recent labs show increasing A1c of 8.8%, increasing from 7.3%.     -She is using her CGM sensor . - Betty Nguyen remains at a high risk for more acute and chronic complications which include CAD, CVA, CKD, retinopathy, and neuropathy. These are all discussed in detail with the patient.  - I have counseled her on diet management by adopting a carbohydrate restricted/protein rich diet.  -  Suggestion is made for her to avoid simple carbohydrates  from her diet including Cakes, Sweet Desserts / Pastries, Ice Cream, Soda (diet and regular), Sweet Tea, Candies, Chips, Cookies, Store Bought Juices, Alcohol in Excess of  1-2 drinks a day, Artificial Sweeteners, and "Sugar-free" Products. This will help patient to have stable blood glucose profile and potentially avoid unintended weight gain.  - I encouraged her to switch to  unprocessed or minimally processed complex starch and increased protein intake (animal or plant source), fruits, and vegetables.  - she is advised to stick to a routine mealtimes to eat 3 meals  a day and avoid unnecessary snacks ( to snack only to correct hypoglycemia).    - I have approached her with the following individualized plan to manage diabetes and patient agrees:   -  She is still struggling with the fact that she has to take multiple daily injections of insulin to treat her diabetes. - Given her history of pancreatectomy, and her current presentation with  postprandial hyperglycemia, she will continue to need intensive treatment with basal/bolus insulin in order for her to control diabetes to target.   - #1 goal in her treatment of diabetes would be avoiding hypoglycemia which she is rightly concerned about, however, detailed review of her CGM device did not reveal any hypoglycemia. -Her CGM monitor has been  of a big help avoiding hypoglycemia  and controlling reasonable glucose profile.    -He is advised to increase Toujeo to 12  units daily at bedtime, continue Humalog  4-6 units 3 times a day before meals for pre-meal blood glucose above 90 mg/dL. - She will continue to use her CGM to document blood glucose 4 times a day-before meals for insulin dosing purposes and at bedtime. -Patient is encouraged to call clinic for blood glucose levels less than 70 or above 300 mg /dl.   - Insulin is  her exclusive choice of therapy  to treat her diabetes. - she is not a candidate for metformin, SGLT2 inhibitors, nor incretin therapy .  - Patient specific target  A1c;  LDL, HDL, Triglycerides, and  Waist Circumference were discussed in detail.  2) BP/HTN: Her blood pressure is controlled to target.      3) Lipids/HPL: Recent lipid panel showed controlled with LDL at 58.  4)  exocrine pancreatic insufficiency: She  is benefiting from Creon therapy, however she reports inconsistency taking this medication.  - I advised her to continue  Creon 24,000 units, 1 with Meals and Snacks, she reports symptomatic improvement with this medication. - Her celiac screen is negative, I advised her to follow gluten-regular diet.  5) Chronic Care/Health Maintenance:  -she  is not  on ACEI/ARB and Statin medications and  is encouraged to continue to follow up with Ophthalmology, Dentist,  Podiatrist at least yearly or according to recommendations, and advised to   stay away from smoking. I have recommended yearly flu vaccine and pneumonia vaccination at least every 5 years, and  sleep for at least 7 hours a day.  - I advised patient to maintain close follow up with Sinda Du, MD for primary care needs.   - Time spent with the patient: 25 min, of which >50% was spent in reviewing her blood glucose logs , discussing her hypo- and hyper-glycemic episodes, reviewing her current and  previous labs and insulin doses and developing a plan to avoid hypo- and  hyper-glycemia. Please refer to Patient Instructions for Blood Glucose Monitoring and Insulin/Medications Dosing Guide"  in media tab for additional information. Erin Sons participated in the discussions, expressed understanding, and voiced agreement with the above plans.  All questions were answered to her satisfaction. she is encouraged to contact clinic should she have any questions or concerns prior to her return visit.   Follow up plan: - Return in about 3 months (around 05/04/2018) for Meter, and Logs, Follow up with Pre-visit Labs, Meter, and Logs.  Glade Lloyd, MD Phone: [phone]  Fax: [phone]   02/01/2018, 4:52 PM This note was partially dictated with voice recognition software. Similar sounding words can be transcribed inadequately or may not  be corrected upon review.

## 2018-02-01 NOTE — Patient Instructions (Signed)

## 2018-02-05 ENCOUNTER — Other Ambulatory Visit: Payer: Self-pay | Admitting: "Endocrinology

## 2018-04-12 ENCOUNTER — Ambulatory Visit (INDEPENDENT_AMBULATORY_CARE_PROVIDER_SITE_OTHER): Payer: Medicare Other

## 2018-04-12 VITALS — Ht 59.5 in | Wt 149.0 lb

## 2018-04-12 DIAGNOSIS — F419 Anxiety disorder, unspecified: Secondary | ICD-10-CM | POA: Diagnosis not present

## 2018-04-12 DIAGNOSIS — E118 Type 2 diabetes mellitus with unspecified complications: Secondary | ICD-10-CM | POA: Diagnosis not present

## 2018-04-12 DIAGNOSIS — K8681 Exocrine pancreatic insufficiency: Secondary | ICD-10-CM | POA: Diagnosis not present

## 2018-04-12 DIAGNOSIS — J4530 Mild persistent asthma, uncomplicated: Secondary | ICD-10-CM | POA: Diagnosis not present

## 2018-04-12 DIAGNOSIS — E1165 Type 2 diabetes mellitus with hyperglycemia: Secondary | ICD-10-CM

## 2018-04-12 DIAGNOSIS — IMO0002 Reserved for concepts with insufficient information to code with codable children: Secondary | ICD-10-CM

## 2018-04-12 NOTE — Progress Notes (Signed)
Pt brings in herFreestyle Libretoday.Shehas the 14day reader and sensors. Went over instructions on how to use the [place] system. Also went over instruction on how to properly apply the sensor to herarm. Libre sensor was placed on theinsideof pts upper Larm.Libre removed from L arm.Pt has issues replacing the [place] sensor every 14 days.  Shevoices understanding on how to use the system and how to get BG reading for insulin injections and as needed. Shewas advised to contact the office if he has any questions or concerns.

## 2018-04-12 NOTE — Patient Instructions (Signed)
Pt instructed to contact office with any questions/concerns.

## 2018-05-02 ENCOUNTER — Other Ambulatory Visit: Payer: Self-pay

## 2018-05-02 ENCOUNTER — Inpatient Hospital Stay (HOSPITAL_COMMUNITY)
Admission: EM | Admit: 2018-05-02 | Discharge: 2018-05-04 | DRG: 445 | Disposition: A | Discharge status: Home / Self Care | Payer: Medicare Other | Attending: Pulmonary Disease | Admitting: Pulmonary Disease

## 2018-05-02 ENCOUNTER — Encounter (HOSPITAL_COMMUNITY): Payer: Self-pay | Admitting: Emergency Medicine

## 2018-05-02 ENCOUNTER — Emergency Department (HOSPITAL_COMMUNITY): Payer: Medicare Other

## 2018-05-02 DIAGNOSIS — K8681 Exocrine pancreatic insufficiency: Secondary | ICD-10-CM | POA: Diagnosis not present

## 2018-05-02 DIAGNOSIS — K8309 Other cholangitis: Secondary | ICD-10-CM | POA: Diagnosis present

## 2018-05-02 DIAGNOSIS — J45909 Unspecified asthma, uncomplicated: Secondary | ICD-10-CM | POA: Diagnosis present

## 2018-05-02 DIAGNOSIS — E1169 Type 2 diabetes mellitus with other specified complication: Secondary | ICD-10-CM | POA: Diagnosis present

## 2018-05-02 DIAGNOSIS — Z934 Other artificial openings of gastrointestinal tract status: Secondary | ICD-10-CM

## 2018-05-02 DIAGNOSIS — Z853 Personal history of malignant neoplasm of breast: Secondary | ICD-10-CM

## 2018-05-02 DIAGNOSIS — E871 Hypo-osmolality and hyponatremia: Secondary | ICD-10-CM | POA: Diagnosis not present

## 2018-05-02 DIAGNOSIS — K5730 Diverticulosis of large intestine without perforation or abscess without bleeding: Secondary | ICD-10-CM | POA: Diagnosis not present

## 2018-05-02 DIAGNOSIS — Z885 Allergy status to narcotic agent status: Secondary | ICD-10-CM

## 2018-05-02 DIAGNOSIS — Z9851 Tubal ligation status: Secondary | ICD-10-CM

## 2018-05-02 DIAGNOSIS — Z9049 Acquired absence of other specified parts of digestive tract: Secondary | ICD-10-CM

## 2018-05-02 DIAGNOSIS — F32A Depression, unspecified: Secondary | ICD-10-CM | POA: Diagnosis present

## 2018-05-02 DIAGNOSIS — Z823 Family history of stroke: Secondary | ICD-10-CM

## 2018-05-02 DIAGNOSIS — F329 Major depressive disorder, single episode, unspecified: Secondary | ICD-10-CM | POA: Diagnosis present

## 2018-05-02 DIAGNOSIS — Z833 Family history of diabetes mellitus: Secondary | ICD-10-CM

## 2018-05-02 DIAGNOSIS — K219 Gastro-esophageal reflux disease without esophagitis: Secondary | ICD-10-CM | POA: Diagnosis not present

## 2018-05-02 DIAGNOSIS — K869 Disease of pancreas, unspecified: Secondary | ICD-10-CM

## 2018-05-02 DIAGNOSIS — Z8507 Personal history of malignant neoplasm of pancreas: Secondary | ICD-10-CM

## 2018-05-02 DIAGNOSIS — Z881 Allergy status to other antibiotic agents status: Secondary | ICD-10-CM

## 2018-05-02 DIAGNOSIS — Z8249 Family history of ischemic heart disease and other diseases of the circulatory system: Secondary | ICD-10-CM

## 2018-05-02 DIAGNOSIS — Z87442 Personal history of urinary calculi: Secondary | ICD-10-CM

## 2018-05-02 DIAGNOSIS — Z91018 Allergy to other foods: Secondary | ICD-10-CM

## 2018-05-02 DIAGNOSIS — Z888 Allergy status to other drugs, medicaments and biological substances status: Secondary | ICD-10-CM

## 2018-05-02 DIAGNOSIS — Z794 Long term (current) use of insulin: Secondary | ICD-10-CM

## 2018-05-02 DIAGNOSIS — R509 Fever, unspecified: Secondary | ICD-10-CM | POA: Diagnosis not present

## 2018-05-02 DIAGNOSIS — Z79899 Other long term (current) drug therapy: Secondary | ICD-10-CM

## 2018-05-02 DIAGNOSIS — E785 Hyperlipidemia, unspecified: Secondary | ICD-10-CM | POA: Diagnosis present

## 2018-05-02 DIAGNOSIS — R52 Pain, unspecified: Secondary | ICD-10-CM

## 2018-05-02 DIAGNOSIS — F419 Anxiety disorder, unspecified: Secondary | ICD-10-CM | POA: Diagnosis present

## 2018-05-02 DIAGNOSIS — Z90411 Acquired partial absence of pancreas: Secondary | ICD-10-CM

## 2018-05-02 LAB — URINALYSIS, ROUTINE W REFLEX MICROSCOPIC
Bacteria, UA: NONE SEEN
Bilirubin Urine: NEGATIVE
GLUCOSE, UA: NEGATIVE mg/dL
Ketones, ur: 5 mg/dL — AB
Leukocytes, UA: NEGATIVE
Nitrite: NEGATIVE
PH: 6 (ref 5.0–8.0)
Protein, ur: NEGATIVE mg/dL
SPECIFIC GRAVITY, URINE: 1.005 (ref 1.005–1.030)

## 2018-05-02 LAB — CBC WITH DIFFERENTIAL/PLATELET
Abs Immature Granulocytes: 0.03 10*3/uL (ref 0.00–0.07)
Basophils Absolute: 0 10*3/uL (ref 0.0–0.1)
Basophils Relative: 0 %
Eosinophils Absolute: 0 10*3/uL (ref 0.0–0.5)
Eosinophils Relative: 0 %
HCT: 38.5 % (ref 36.0–46.0)
Hemoglobin: 12.5 g/dL (ref 12.0–15.0)
IMMATURE GRANULOCYTES: 0 %
LYMPHS ABS: 1.4 10*3/uL (ref 0.7–4.0)
Lymphocytes Relative: 16 %
MCH: 29.3 pg (ref 26.0–34.0)
MCHC: 32.5 g/dL (ref 30.0–36.0)
MCV: 90.2 fL (ref 80.0–100.0)
Monocytes Absolute: 0.9 10*3/uL (ref 0.1–1.0)
Monocytes Relative: 10 %
NEUTROS PCT: 74 %
Neutro Abs: 6.9 10*3/uL (ref 1.7–7.7)
Platelets: 229 10*3/uL (ref 150–400)
RBC: 4.27 MIL/uL (ref 3.87–5.11)
RDW: 13.2 % (ref 11.5–15.5)
WBC: 9.3 10*3/uL (ref 4.0–10.5)
nRBC: 0 % (ref 0.0–0.2)

## 2018-05-02 LAB — COMPREHENSIVE METABOLIC PANEL WITH GFR
ALT: 515 U/L — ABNORMAL HIGH (ref 0–44)
AST: 522 U/L — ABNORMAL HIGH (ref 15–41)
Albumin: 3.6 g/dL (ref 3.5–5.0)
Alkaline Phosphatase: 276 U/L — ABNORMAL HIGH (ref 38–126)
Anion gap: 8 (ref 5–15)
BUN: 13 mg/dL (ref 8–23)
CO2: 23 mmol/L (ref 22–32)
Calcium: 8.9 mg/dL (ref 8.9–10.3)
Chloride: 102 mmol/L (ref 98–111)
Creatinine, Ser: 0.58 mg/dL (ref 0.44–1.00)
GFR calc Af Amer: >60 mL/min
GFR calc non Af Amer: >60 mL/min
Glucose, Bld: 212 mg/dL — ABNORMAL HIGH (ref 70–99)
Potassium: 3.5 mmol/L (ref 3.5–5.1)
Sodium: 133 mmol/L — ABNORMAL LOW (ref 135–145)
Total Bilirubin: 3.1 mg/dL — ABNORMAL HIGH (ref 0.3–1.2)
Total Protein: 7.4 g/dL (ref 6.5–8.1)

## 2018-05-02 LAB — PROCALCITONIN: Procalcitonin: 0.33 ng/mL

## 2018-05-02 LAB — PROTIME-INR
INR: 1.15
Prothrombin Time: 14.6 seconds (ref 11.4–15.2)

## 2018-05-02 LAB — I-STAT CG4 LACTIC ACID, ED: Lactic Acid, Venous: 0.96 mmol/L (ref 0.5–1.9)

## 2018-05-02 LAB — PHOSPHORUS: Phosphorus: 2.4 mg/dL — ABNORMAL LOW (ref 2.5–4.6)

## 2018-05-02 LAB — INFLUENZA PANEL BY PCR (TYPE A & B)
Influenza A By PCR: NEGATIVE
Influenza B By PCR: NEGATIVE

## 2018-05-02 LAB — MAGNESIUM: Magnesium: 1.7 mg/dL (ref 1.7–2.4)

## 2018-05-02 MED ORDER — ACETAMINOPHEN 500 MG PO TABS
1000.0000 mg | ORAL_TABLET | Freq: Once | ORAL | Status: AC
  Administered 2018-05-02: 1000 mg via ORAL
  Filled 2018-05-02: qty 2

## 2018-05-02 MED ORDER — SODIUM CHLORIDE 0.9 % IV SOLN
500.0000 mg | Freq: Once | INTRAVENOUS | Status: AC
  Administered 2018-05-02: 500 mg via INTRAVENOUS
  Filled 2018-05-02: qty 500

## 2018-05-02 MED ORDER — FAMOTIDINE IN NACL 20-0.9 MG/50ML-% IV SOLN
20.0000 mg | Freq: Two times a day (BID) | INTRAVENOUS | Status: DC
  Administered 2018-05-02 – 2018-05-04 (×3): 20 mg via INTRAVENOUS
  Filled 2018-05-02 (×3): qty 50

## 2018-05-02 MED ORDER — SODIUM CHLORIDE 0.9 % IV BOLUS
1000.0000 mL | Freq: Once | INTRAVENOUS | Status: AC
  Administered 2018-05-02: 1000 mL via INTRAVENOUS

## 2018-05-02 MED ORDER — ONDANSETRON HCL 4 MG PO TABS: 4.0000 mg | ORAL_TABLET | Freq: Four times a day (QID) | ORAL | Status: DC | PRN

## 2018-05-02 MED ORDER — ONDANSETRON HCL 4 MG/2ML IJ SOLN: 4.0000 mg | Freq: Four times a day (QID) | INTRAMUSCULAR | Status: DC | PRN

## 2018-05-02 MED ORDER — IOPAMIDOL (ISOVUE-300) INJECTION 61%
100.0000 mL | Freq: Once | INTRAVENOUS | Status: AC | PRN
  Administered 2018-05-02: 100 mL via INTRAVENOUS

## 2018-05-02 MED ORDER — SODIUM CHLORIDE 0.9 % IV SOLN
2.0000 g | Freq: Once | INTRAVENOUS | Status: AC
  Administered 2018-05-03: 2 g via INTRAVENOUS
  Filled 2018-05-02: qty 2

## 2018-05-02 MED ORDER — POTASSIUM CHLORIDE IN NACL 20-0.9 MEQ/L-% IV SOLN
INTRAVENOUS | Status: DC
  Administered 2018-05-02 – 2018-05-04 (×3): via INTRAVENOUS
  Filled 2018-05-02: qty 1000

## 2018-05-02 MED ORDER — LEVOFLOXACIN IN D5W 750 MG/150ML IV SOLN
750.0000 mg | Freq: Once | INTRAVENOUS | Status: DC
  Filled 2018-05-02: qty 150

## 2018-05-02 NOTE — ED Provider Notes (Signed)
[facility] EMERGENCY DEPARTMENT Provider Note   CSN: [id number] Arrival date & time: 05/02/18  1801     History   Chief Complaint Chief Complaint  Patient presents with  . Fever    HPI Betty Nguyen is a 78 y.o. female.  HPI  The patient is a very pleasant 78 year old female who presents with a complaint of a fever which was as high as 102.5 at home today.  She reports that she has had some subjective fevers and chills for the last 3 days but did not measure her temperature until this evening.  She has had some slight generalized weakness but denies any other infectious symptoms including coughing, shortness of breath, chest pain, rash, headache, sore throat, cough, congestion, diarrhea, dysuria or frequency.  She does report that she had a hernia repair approximately 4 months ago but has not had any complications of that except for a slight burning sensation after she stretched her abdominal muscle and has been dealing with a couple of weeks of a burning sensation to the left mid abdomen.  This is not really bothered her today anymore than it has in the past.  She denies any known sick contacts including influenza exposures.  Review of the medical record confirms the patient's history of pancreatectomy several years ago, she is an insulin requiring diabetic after having a Whipple procedure.  Past Medical History:  Diagnosis Date  . Anxiety   . Anxiety and depression   . Asthma   . Breast carcinoma ([place]) 08/2009   Invasive ductal; left; lumpectomy and sentinel node excision- oncology visits yearly.  . Chest pain   . Depression   . Diabetes mellitus type II, controlled ([place])    diet controlled,exercise, no meds  . Diverticulitis 2012   Treated medically  . Gastroesophageal reflux   . Hyperlipidemia   . Nephrolithiasis   . Tick bite    [place] Tick, alpha gal positive, makes her allergic to meat    Patient Active Problem List   Diagnosis Date Noted  . Acute cholangitis  05/02/2018  . 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 Exocrine pancreatic insufficiency 01/15/2017  . Muscle weakness 11/01/2012  . Calf pain 10/02/2012  . Asthma 07/12/2012  . Gastroesophageal reflux   . Nephrolithiasis   . Diverticulitis   . Chest pain   . Breast carcinoma ([place]) 08/26/2009    Past Surgical History:  Procedure Laterality Date  . BREAST LUMPECTOMY     Left  . CESAREAN SECTION    . CHOLECYSTECTOMY    . COLONOSCOPY  2004  . DILATION AND CURETTAGE OF UTERUS    . EUS N/A 09/07/2014   Procedure: UPPER ENDOSCOPIC ULTRASOUND (EUS) LINEAR;  Surgeon: Milus Banister, MD;  Location: [facility] ENDOSCOPY;  Service: Endoscopy;  Laterality: N/A;  . HERNIA REPAIR    . NEPHROLITHOTOMY    . TUBAL LIGATION    . WHIPPLE PROCEDURE       OB History    Gravida  4   Para  2   Term  2   Preterm      AB      Living  2     SAB      TAB      Ectopic      Multiple      Live Births               Home Medications    Prior to Admission medications  Medication Sig Start Date End Date Taking? Authorizing Provider  albuterol (PROVENTIL HFA;VENTOLIN HFA) 108 (90 BASE) MCG/ACT inhaler Inhale 2 puffs into the lungs every 6 (six) hours as needed for wheezing.   Yes [provider]  dicyclomine (BENTYL) 10 MG capsule Take 1 tablet once or twice daily as needed for abdominal pain and gas. Patient taking differently: Take 10 mg by mouth 2 (two) times daily as needed (FOR ABDOMINAL PAIN/GAS).  10/07/16  Yes Esterwood, Amy S, PA-C  fluticasone furoate-vilanterol (BREO ELLIPTA) 200-25 MCG/INH AEPB Inhale 1 puff into the lungs daily.   Yes [provider]  HUMALOG KWIKPEN 100 UNIT/ML KiwkPen INJECT 4 TO 10 UNITS INTO THE SKIN 3 TIMES DAILY WITH MEALS. Patient taking differently: Inject 4-5 Units into the skin 3 (three) times daily before meals. DEPENDING ON BLOOD SUGAR LEVELS. TAKES AS NEEDED 02/08/18  Yes Nida, Marella Chimes, MD    Insulin Glargine (TOUJEO MAX SOLOSTAR) 300 UNIT/ML SOPN Inject 12 Units into the skin at bedtime. Patient taking differently: Inject 9-12 Units into the skin at bedtime as needed (FOR HIGH BLOOD SUGAR LEVELS OVER 230).  02/01/18  Yes Nida, Marella Chimes, MD  omeprazole (PRILOSEC) 20 MG capsule Take 1 capsule (20 mg total) by mouth daily. 05/04/14  Yes Davonna Belling, MD  Pancrelipase, Lip-Prot-Amyl, (CREON) 24000-76000 units CPEP TAKE 1 CAPSULE WITH MEALS AND SNACKS UP TO 6 A DAY Patient taking differently: Take 1 capsule by mouth 3 (three) times daily with meals. TAKE 1 CAPSULE WITH MEALS AND SNACKS UP TO 6 A DAY 09/23/17  Yes Nida, Marella Chimes, MD  valACYclovir (VALTREX) 500 MG tablet Take 500 mg by mouth daily as needed (outbreak).    Yes [provider]    Family History Family History  Problem Relation Age of Onset  . Coronary artery disease Mother   . Stroke Father   . Diabetes Mellitus II Father   . Colon cancer Neg Hx     Social History Social History   Tobacco Use  . Smoking status: Never Smoker  . Smokeless tobacco: Never Used  Substance Use Topics  . Alcohol use: No  . Drug use: No     Allergies   Biaxin [clarithromycin]; Carafate [sucralfate]; Codeine; Doxycycline; Meat [alpha-gal]; Metronidazole; and Cephalosporins   Review of Systems Review of Systems  All other systems reviewed and are negative.    Physical Exam Updated Vital Signs BP 132/70 (BP Location: Right Arm)   Pulse 98   Temp (!) 101.8 F (38.8 C) (Rectal)   Resp 16   Ht 1.499 m (4\' 11" )   Wt 67.5 kg   SpO2 97%   BMI 30.06 kg/m   Physical Exam Vitals signs and nursing note reviewed.  Constitutional:      General: She is not in acute distress.    Appearance: She is well-developed.  HENT:     Head: Normocephalic and atraumatic.     Mouth/Throat:     Pharynx: No oropharyngeal exudate.  Eyes:     General: No scleral icterus.       Right eye: No discharge.        Left  eye: No discharge.     Conjunctiva/sclera: Conjunctivae normal.     Pupils: Pupils are equal, round, and reactive to light.  Neck:     Musculoskeletal: Normal range of motion and neck supple.     Thyroid: No thyromegaly.     Vascular: No JVD.  Cardiovascular:     Rate and  Rhythm: Regular rhythm.     Heart sounds: Normal heart sounds. No murmur. No friction rub. No gallop.      Comments: Borderline tachycardia at 100 bpm Pulmonary:     Effort: Pulmonary effort is normal. No respiratory distress.     Breath sounds: Normal breath sounds. No wheezing or rales.  Abdominal:     General: Bowel sounds are normal. There is no distension.     Palpations: Abdomen is soft. There is no mass.     Tenderness: There is no abdominal tenderness.     Comments: The abdominal wall was evaluated, the surgical scars are well-healed and there is no redness or tenderness across the abdomen or the inguinal regions  Musculoskeletal: Normal range of motion.        General: No tenderness.  Lymphadenopathy:     Cervical: No cervical adenopathy.  Skin:    General: Skin is warm and dry.     Findings: No erythema or rash.  Neurological:     Mental Status: She is alert.     Coordination: Coordination normal.  Psychiatric:        Behavior: Behavior normal.      ED Treatments / Results  Labs (all labs ordered are listed, but only abnormal results are displayed) Labs Reviewed  COMPREHENSIVE METABOLIC PANEL - Abnormal; Notable for the following components:      Result Value   Sodium 133 (*)    Glucose, Bld 212 (*)    AST 522 (*)    ALT 515 (*)    Alkaline Phosphatase 276 (*)    Total Bilirubin 3.1 (*)    All other components within normal limits  URINALYSIS, ROUTINE W REFLEX MICROSCOPIC - Abnormal; Notable for the following components:   Hgb urine dipstick MODERATE (*)    Ketones, ur 5 (*)    All other components within normal limits  URINE CULTURE  INFLUENZA PANEL BY PCR (TYPE A & B)  CBC WITH  DIFFERENTIAL/PLATELET  PROCALCITONIN  I-STAT CG4 LACTIC ACID, ED  I-STAT CG4 LACTIC ACID, ED    EKG EKG Interpretation  Date/Time:  Sunday May 02 2018 19:11:03 EST Ventricular Rate:  87 PR Interval:    QRS Duration: 83 QT Interval:  342 QTC Calculation: 412 R Axis:   52 Text Interpretation:  Sinus rhythm Borderline T wave abnormalities Baseline wander in lead(s) II III aVR aVF V4 V6 Confirmed by Noemi Chapel [phone]) on 05/02/2018 7:23:39 PM   Radiology Dg Chest 2 View  Result Date: 05/02/2018 CLINICAL DATA:  Pt reports fever last night and today. Denies other symptoms. Denies urinary symptoms/denies SOB/ denies body aches. Only reports decreased appetite. Reports fever around 5pm today was 102.4HISTORY OF CANCER, ASTHMA, DM EXAM: CHEST - 2 VIEW COMPARISON:  01/04/2018 FINDINGS: Cardiac silhouette is normal in size. Normal mediastinal and hilar contours. Clear lungs.  No pleural effusion or pneumothorax. Skeletal structures are intact. IMPRESSION: No active cardiopulmonary disease. Electronically Signed   By: Lajean Manes M.D.   On: 05/02/2018 20:37    Procedures Procedures (including critical care time)  Medications Ordered in ED Medications  acetaminophen (TYLENOL) tablet 1,000 mg (1,000 mg Oral Given 05/02/18 2012)  imipenem-cilastatin (PRIMAXIN) 500 mg in sodium chloride 0.9 % 100 mL IVPB (500 mg Intravenous New Bag/Given 05/02/18 2111)  iopamidol (ISOVUE-300) 61 % injection 100 mL (100 mLs Intravenous Contrast Given 05/02/18 2202)     Initial Impression / Assessment and Plan / ED Course  I have reviewed the triage vital  signs and the nursing notes.  Pertinent labs & imaging results that were available during my care of the patient were reviewed by me and considered in my medical decision making (see chart for details).  Clinical Course as of May 02 2221  Nancy Fetter May 02, 2018  1949 Temperature 101.8, Tylenol ordered   [BM]  2026 The patient does have some evidence of biliary  tract disease with a bilirubin of 3.1 and liver function test in the 500 range.  This is definitely concerning and will need to be is discussed with gastroenterology.  CT scan will likely be needed to evaluate for biliary obstruction   [BM]  2058 Discussed case with Dr. Laural Golden who is agreeable to see the patient in consultation and agrees with admission and antibiotics for a possible cholangitis - Pt agreeable - given allergies meds chosen as alternatives to cephalosporins   [BM]    Clinical Course User Index [BM] Noemi Chapel, MD    At this time the patient does definitely have a subjective palpable fever however she does not have hypotension, the source of the fever is unclear and she really has no other symptoms prompting the work-up with labs x-ray and influenza swab.  I do not see the need to give antibiotics aggressively at this time or to activate a code sepsis just yet.  D/w Dr. Olevia Bowens at [facility] - agreeable to admit,  Final Clinical Impressions(s) / ED Diagnoses   Final diagnoses:  Cholangitis     Noemi Chapel, MD 05/02/18 2223

## 2018-05-02 NOTE — ED Triage Notes (Signed)
Pt reports fever last night and today. Denies other symptoms. Denies urinary symptoms/denies SOB/ denies body aches. Only reports decreased appetite. Reports fever around 5pm today was 102.4

## 2018-05-02 NOTE — H&P (Signed)
History and Physical    Betty Nguyen LGX:[id number] DOB: 1940/05/15 DOA: 05/02/2018  PCP: Sinda Du, MD   Patient coming from: Home.  I have personally briefly reviewed patient's old medical records in [place]  Chief Complaint: Fever.  HPI: Betty Nguyen is a 78 y.o. female with medical history significant of anxiety, depression, asthma, ductal invasive left breast carcinoma, history of chest pain, type 2 diabetes, diverticulosis, GERD, hyperlipidemia, nephrolithiasis, history of lung start tech, alpha gal positive allergy to meat who is coming to the emergency department with complaints of fever and decreased appetite since yesterday.  However, the patient states that she has been having chills and rigors for the last 3 days.  She was last able to have a meal on Saturday evening when she had pork chops for dinner.  She also mentions that a few days ago she had LUQ tenderness, where her hernia was repair and used some warm packs to ease the pain.  She denies nausea, emesis, diarrhea, constipation, melena or hematochezia.  No dysuria, frequency or hematuria.  She denies headache, sore throat, rhinorrhea, wheezing, cough, hemoptysis, dyspnea, chest pain, palpitations, dizziness, diaphoresis, PND, orthopnea or pitting edema of the lower extremities.  She denies polyuria, polydipsia, polyphagia or blurred vision.  She has not been using her Lantus at bedtime, except for elevated CBG numbers over 200 mg/dL.  She is using 3 to 4 units of regular insulin with meals.  ED Course: Initial vital signs temperature 99.4, then followed by 101.8 F.  Pulse was 98, respiratory rate 16, BP 132/70 mmHg and O2 sat 97% on room air.  The patient received imipenem 500 mg IVPB.  I added 1000 mL NS bolus.  Urinalysis shows moderate hemoglobinuria with 5 mg/dL of ketones, but otherwise within normal limits.  Her CBC shows a white count of 9.3 with 74% neutrophils, 16% lymphocytes and 10% monocytes.  PT was 14.6  seconds and INR 1.15.  Influenza A and B by PCR was negative.  Lactic acid was normal.  Procalcitonin was normal.  Magnesium was 1.6 and phosphorus 2.4 mg/dL.  CMP shows a sodium of 133 mmol/L.  All other electrolytes are within normal limits.  Glucose 212 and bilirubin 3.1 mg/dL.  AST is 522, ALT 515 and alkaline phosphatase 276 units/L.  Imaging: CT abdomen/pelvis with contrast shows post Whipple procedure with mild dilatation of the CBD at 10 mm of diameter associated with minimal intrahepatic biliary dilatation.  Recommend correlation with LFTs.  Please see images and full radiology report for further detail.  Review of Systems: As per HPI otherwise 10 point review of systems negative.   Past Medical History:  Diagnosis Date  . Anxiety   . Anxiety and depression   . Asthma   . Breast carcinoma ([place]) 08/2009   Invasive ductal; left; lumpectomy and sentinel node excision- oncology visits yearly.  . Chest pain   . Depression   . Diabetes mellitus type II, controlled ([place])    diet controlled,exercise, no meds  . Diverticulitis 2012   Treated medically  . Gastroesophageal reflux   . Hyperlipidemia   . Nephrolithiasis   . Tick bite    [place] Tick, alpha gal positive, makes her allergic to meat    Past Surgical History:  Procedure Laterality Date  . BREAST LUMPECTOMY     Left  . CESAREAN SECTION    . CHOLECYSTECTOMY    . COLONOSCOPY  2004  . DILATION AND CURETTAGE OF UTERUS    .  EUS N/A 09/07/2014   Procedure: UPPER ENDOSCOPIC ULTRASOUND (EUS) LINEAR;  Surgeon: Milus Banister, MD;  Location: [facility] ENDOSCOPY;  Service: Endoscopy;  Laterality: N/A;  . HERNIA REPAIR    . NEPHROLITHOTOMY    . TUBAL LIGATION    . WHIPPLE PROCEDURE       reports that she has never smoked. She has never used smokeless tobacco. She reports that she does not drink alcohol or use drugs.  Allergies  Allergen Reactions  . Biaxin [Clarithromycin] Other (See Comments)    Hallucinations  . Carafate  [Sucralfate] Other (See Comments)    Flared up her vertigo, run her blood sugar up and caused constipation  . Codeine Other (See Comments)    Hallucinations   . Doxycycline Other (See Comments)    GI upset , turned stool orange  . Meat [Alpha-Gal]     RED MEAT   . Metronidazole Other (See Comments)    Heart Palpations -flagyl  . Cephalosporins Hives and Palpitations    Family History  Problem Relation Age of Onset  . Coronary artery disease Mother   . Stroke Father   . Diabetes Mellitus II Father   . Colon cancer Neg Hx    Prior to Admission medications   Medication Sig Start Date End Date Taking? Authorizing Provider  albuterol (PROVENTIL HFA;VENTOLIN HFA) 108 (90 BASE) MCG/ACT inhaler Inhale 2 puffs into the lungs every 6 (six) hours as needed for wheezing.   Yes [provider]  dicyclomine (BENTYL) 10 MG capsule Take 1 tablet once or twice daily as needed for abdominal pain and gas. Patient taking differently: Take 10 mg by mouth 2 (two) times daily as needed (FOR ABDOMINAL PAIN/GAS).  10/07/16  Yes Esterwood, Amy S, PA-C  fluticasone furoate-vilanterol (BREO ELLIPTA) 200-25 MCG/INH AEPB Inhale 1 puff into the lungs daily.   Yes [provider]  HUMALOG KWIKPEN 100 UNIT/ML KiwkPen INJECT 4 TO 10 UNITS INTO THE SKIN 3 TIMES DAILY WITH MEALS. Patient taking differently: Inject 4-5 Units into the skin 3 (three) times daily before meals. DEPENDING ON BLOOD SUGAR LEVELS. TAKES AS NEEDED 02/08/18  Yes Nida, Marella Chimes, MD  Insulin Glargine (TOUJEO MAX SOLOSTAR) 300 UNIT/ML SOPN Inject 12 Units into the skin at bedtime. Patient taking differently: Inject 9-12 Units into the skin at bedtime as needed (FOR HIGH BLOOD SUGAR LEVELS OVER 230).  02/01/18  Yes Nida, Marella Chimes, MD  omeprazole (PRILOSEC) 20 MG capsule Take 1 capsule (20 mg total) by mouth daily. 05/04/14  Yes Davonna Belling, MD  Pancrelipase, Lip-Prot-Amyl, (CREON) 24000-76000 units CPEP TAKE 1 CAPSULE  WITH MEALS AND SNACKS UP TO 6 A DAY Patient taking differently: Take 1 capsule by mouth 3 (three) times daily with meals. TAKE 1 CAPSULE WITH MEALS AND SNACKS UP TO 6 A DAY 09/23/17  Yes Nida, Marella Chimes, MD  valACYclovir (VALTREX) 500 MG tablet Take 500 mg by mouth daily as needed (outbreak).    Yes [provider]    Physical Exam: Vitals:   05/02/18 1811 05/02/18 1813 05/02/18 1946  BP:  132/70   Pulse:  98   Resp:  16   Temp:  99.4 F (37.4 C) (!) 101.8 F (38.8 C)  TempSrc:  Oral Rectal  SpO2:  97%   Weight: 67.5 kg    Height: 4\' 11"  (1.499 m)      Constitutional: Mildly febrile, but otherwise in NAD, calm, comfortable Eyes: PERRL, lids and conjunctivae are mildly injected.  Mild scleral icterus.  ENMT: Mucous membranes are mildly dry. Posterior pharynx clear of any exudate or lesions. Neck: normal, supple, no masses, no thyromegaly Respiratory: clear to auscultation bilaterally, no wheezing, no crackles. Normal respiratory effort. No accessory muscle use.  Cardiovascular: Regular rate and rhythm, no murmurs / rubs / gallops. No extremity edema. 2+ pedal pulses. No carotid bruits.  Abdomen: Soft, mild LLQ tenderness, no masses palpated. No hepatosplenomegaly. Bowel sounds positive.  Musculoskeletal: no clubbing / cyanosis. Good ROM, no contractures. Normal muscle tone.  Skin: no sniffing rashes, lesions, ulcers. No induration on limited dermatological examination. Neurologic: CN 2-12 grossly intact. Sensation intact, DTR normal. Strength 5/5 in all 4.  Psychiatric: Normal judgment and insight. Alert and oriented x 3. Normal mood.   Labs on Admission: I have personally reviewed following labs and imaging studies  CBC: Recent Labs  Lab 05/02/18 1932  WBC 9.3  NEUTROABS 6.9  HGB 12.5  HCT 38.5  MCV 90.2  PLT 326   Basic Metabolic Panel: Recent Labs  Lab 05/02/18 1932  NA 133*  K 3.5  CL 102  CO2 23  GLUCOSE 212*  BUN 13  CREATININE 0.58  CALCIUM  8.9   GFR: Estimated Creatinine Clearance: 49.2 mL/min (by C-G formula based on SCr of 0.58 mg/dL). Liver Function Tests: Recent Labs  Lab 05/02/18 1932  AST 522*  ALT 515*  ALKPHOS 276*  BILITOT 3.1*  PROT 7.4  ALBUMIN 3.6   No results for input(s): LIPASE, AMYLASE in the last 168 hours. No results for input(s): AMMONIA in the last 168 hours. Coagulation Profile: No results for input(s): INR, PROTIME in the last 168 hours. Cardiac Enzymes: No results for input(s): CKTOTAL, CKMB, CKMBINDEX, TROPONINI in the last 168 hours. BNP (last 3 results) No results for input(s): PROBNP in the last 8760 hours. HbA1C: No results for input(s): HGBA1C in the last 72 hours. CBG: No results for input(s): GLUCAP in the last 168 hours. Lipid Profile: No results for input(s): CHOL, HDL, LDLCALC, TRIG, CHOLHDL, LDLDIRECT in the last 72 hours. Thyroid Function Tests: No results for input(s): TSH, T4TOTAL, FREET4, T3FREE, THYROIDAB in the last 72 hours. Anemia Panel: No results for input(s): VITAMINB12, FOLATE, FERRITIN, TIBC, IRON, RETICCTPCT in the last 72 hours. Urine analysis:    Component Value Date/Time   COLORURINE YELLOW 05/02/2018 1909   APPEARANCEUR CLEAR 05/02/2018 1909   LABSPEC 1.005 05/02/2018 1909   PHURINE 6.0 05/02/2018 1909   GLUCOSEU NEGATIVE 05/02/2018 1909   HGBUR MODERATE (A) 05/02/2018 1909   BILIRUBINUR NEGATIVE 05/02/2018 1909   KETONESUR 5 (A) 05/02/2018 1909   PROTEINUR NEGATIVE 05/02/2018 1909   UROBILINOGEN 0.2 02/04/2015 1255   NITRITE NEGATIVE 05/02/2018 1909   LEUKOCYTESUR NEGATIVE 05/02/2018 1909    Radiological Exams on Admission: Dg Chest 2 View  Result Date: 05/02/2018 CLINICAL DATA:  Pt reports fever last night and today. Denies other symptoms. Denies urinary symptoms/denies SOB/ denies body aches. Only reports decreased appetite. Reports fever around 5pm today was 102.4HISTORY OF CANCER, ASTHMA, DM EXAM: CHEST - 2 VIEW COMPARISON:  01/04/2018  FINDINGS: Cardiac silhouette is normal in size. Normal mediastinal and hilar contours. Clear lungs.  No pleural effusion or pneumothorax. Skeletal structures are intact. IMPRESSION: No active cardiopulmonary disease. Electronically Signed   By: Lajean Manes M.D.   On: 05/02/2018 20:37   Ct Abdomen Pelvis W Contrast  Result Date: 05/02/2018 CLINICAL DATA:  Elevated bilirubin and LFTs,, abdominal pain, prior Whipple procedure, type II diabetes mellitus, GERD, breast cancer post LEFT lumpectomy  EXAM: CT ABDOMEN AND PELVIS WITH CONTRAST TECHNIQUE: Multidetector CT imaging of the abdomen and pelvis was performed using the standard protocol following bolus administration of intravenous contrast. Sagittal and coronal MPR images reconstructed from axial data set. CONTRAST:  159mL ISOVUE-300 IOPAMIDOL (ISOVUE-300) INJECTION 61% IV. No oral contrast. COMPARISON:  01/10/2017 FINDINGS: Lower chest: Lung bases clear Hepatobiliary: Gallbladder surgically absent. Cyst lateral segment LEFT lobe liver 14 x 13 mm decreased from the 29 x 21 mm on the previous exam. No other focal hepatic abnormalities. The proximal CBD is mildly prominent at 10 mm diameter though only minimal central intrahepatic biliary dilatation is seen. Pancreas: Prior resection of the pancreatic head and portion of body post Whipple. Atrophy of the remaining pancreatic body and tail with ductal dilatation similar to previous exam. No definite pancreatic mass. Spleen: Normal appearance Adrenals/Urinary Tract: Diffuse thickening of the LEFT adrenal gland without discrete mass. Tiny cyst RIGHT kidney. Adrenal glands, kidneys, ureters and bladder otherwise normal appearance. Stomach/Bowel: Normal appendix. Prior antrectomy with residual stomach demonstrating prominent wall though this may be an artifact related to underdistention. Scattered stool throughout colon. Minimal distal colonic diverticulosis. Unopacified small bowel loops. Soft tissue at the porta  hepatis and adjacent to the LEFT lobe of the liver likely represents unopacified small bowel and choledochojejunostomy. Vascular/Lymphatic: Atherosclerotic calcifications aorta and iliac arteries without aneurysm. Few scattered normal size mesenteric lymph nodes in RIGHT mid abdomen. Few normal sized lymph nodes are also seen anterior to the IVC in the upper abdomen and LEFT para-aortic. No adenopathy. Reproductive: Unremarkable uterus and adnexa Other: No free air or free fluid. No hernia or acute inflammatory process. Musculoskeletal: No acute osseous findings. Bones demineralized. Again seen potential vertebral hemangioma at T12. IMPRESSION: Post Whipple procedure with mild dilatation of the CBD 10 mm diameter associated with minimal intrahepatic biliary dilatation; recommend correlation with LFTs. No other definite intra-abdominal or intrapelvic abnormalities. Electronically Signed   By: Lavonia Dana M.D.   On: 05/02/2018 22:30    EKG: Independently reviewed.  Vent. rate 87 BPM PR interval * ms QRS duration 83 ms QT/QTc 342/412 ms P-R-T axes 71 52 23 Sinus rhythm Borderline T wave abnormalities Baseline wander in lead(s) II III aVR aVF V4 V6  Assessment/Plan Principal Problem:   Acute cholangitis Observation/telemetry. Keep n.p.o. Continue IV fluids. Analgesics as needed. Antiemetics as needed. Meropenem per pharmacy. Follow-up blood culture and sensitivity. Consult gastroenterology in a.m.  Active Problems:   Gastroesophageal reflux Famotidine 20 mg IVPB every 12 hours.    Asthma Supplemental oxygen as needed. Bronchodilators as needed.    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[place]) Currently n.p.o. Continue IV fluids. CBG monitoring every 6 hours with sensitive sliding scale.    Depression Currently not on medical therapy.    Hyperlipidemia Not on medical therapy.    Hyponatremia Continue IV fluids. Follow-up sodium level.   DVT prophylaxis:  SCDs. Code Status: Full code. Family Communication: Disposition Plan: Admit for IV fluids, symptoms management, IV antibiotics and GI consult. Consults called: Routine gastroenterology counseling a.m. Admission status: Inpatient/telemetry.   Reubin Milan MD [facility]  If 7PM-7AM, please contact night-coverage [email or web address] Password [facility]  05/02/2018, 10:53 PM

## 2018-05-03 ENCOUNTER — Inpatient Hospital Stay (HOSPITAL_COMMUNITY): Payer: Medicare Other

## 2018-05-03 DIAGNOSIS — Z79899 Other long term (current) drug therapy: Secondary | ICD-10-CM | POA: Diagnosis not present

## 2018-05-03 DIAGNOSIS — K7689 Other specified diseases of liver: Secondary | ICD-10-CM | POA: Diagnosis not present

## 2018-05-03 DIAGNOSIS — R945 Abnormal results of liver function studies: Secondary | ICD-10-CM

## 2018-05-03 DIAGNOSIS — R509 Fever, unspecified: Secondary | ICD-10-CM | POA: Diagnosis not present

## 2018-05-03 DIAGNOSIS — Z853 Personal history of malignant neoplasm of breast: Secondary | ICD-10-CM | POA: Diagnosis not present

## 2018-05-03 DIAGNOSIS — Z8507 Personal history of malignant neoplasm of pancreas: Secondary | ICD-10-CM | POA: Diagnosis not present

## 2018-05-03 DIAGNOSIS — Z87442 Personal history of urinary calculi: Secondary | ICD-10-CM | POA: Diagnosis not present

## 2018-05-03 DIAGNOSIS — Z881 Allergy status to other antibiotic agents status: Secondary | ICD-10-CM | POA: Diagnosis not present

## 2018-05-03 DIAGNOSIS — Z794 Long term (current) use of insulin: Secondary | ICD-10-CM | POA: Diagnosis not present

## 2018-05-03 DIAGNOSIS — E1169 Type 2 diabetes mellitus with other specified complication: Secondary | ICD-10-CM | POA: Diagnosis present

## 2018-05-03 DIAGNOSIS — Z8249 Family history of ischemic heart disease and other diseases of the circulatory system: Secondary | ICD-10-CM | POA: Diagnosis not present

## 2018-05-03 DIAGNOSIS — J45909 Unspecified asthma, uncomplicated: Secondary | ICD-10-CM | POA: Diagnosis present

## 2018-05-03 DIAGNOSIS — E1165 Type 2 diabetes mellitus with hyperglycemia: Secondary | ICD-10-CM | POA: Diagnosis not present

## 2018-05-03 DIAGNOSIS — K8681 Exocrine pancreatic insufficiency: Secondary | ICD-10-CM | POA: Diagnosis present

## 2018-05-03 DIAGNOSIS — K8309 Other cholangitis: Secondary | ICD-10-CM | POA: Diagnosis present

## 2018-05-03 DIAGNOSIS — Z9049 Acquired absence of other specified parts of digestive tract: Secondary | ICD-10-CM | POA: Diagnosis not present

## 2018-05-03 DIAGNOSIS — F419 Anxiety disorder, unspecified: Secondary | ICD-10-CM | POA: Diagnosis present

## 2018-05-03 DIAGNOSIS — E785 Hyperlipidemia, unspecified: Secondary | ICD-10-CM | POA: Diagnosis present

## 2018-05-03 DIAGNOSIS — Z9851 Tubal ligation status: Secondary | ICD-10-CM | POA: Diagnosis not present

## 2018-05-03 DIAGNOSIS — E871 Hypo-osmolality and hyponatremia: Secondary | ICD-10-CM | POA: Diagnosis present

## 2018-05-03 DIAGNOSIS — K219 Gastro-esophageal reflux disease without esophagitis: Secondary | ICD-10-CM | POA: Diagnosis present

## 2018-05-03 DIAGNOSIS — K5730 Diverticulosis of large intestine without perforation or abscess without bleeding: Secondary | ICD-10-CM | POA: Diagnosis not present

## 2018-05-03 DIAGNOSIS — Z934 Other artificial openings of gastrointestinal tract status: Secondary | ICD-10-CM | POA: Diagnosis not present

## 2018-05-03 DIAGNOSIS — F329 Major depressive disorder, single episode, unspecified: Secondary | ICD-10-CM | POA: Diagnosis present

## 2018-05-03 DIAGNOSIS — Z823 Family history of stroke: Secondary | ICD-10-CM | POA: Diagnosis not present

## 2018-05-03 DIAGNOSIS — R7989 Other specified abnormal findings of blood chemistry: Secondary | ICD-10-CM | POA: Insufficient documentation

## 2018-05-03 DIAGNOSIS — Z888 Allergy status to other drugs, medicaments and biological substances status: Secondary | ICD-10-CM | POA: Diagnosis not present

## 2018-05-03 DIAGNOSIS — Z833 Family history of diabetes mellitus: Secondary | ICD-10-CM | POA: Diagnosis not present

## 2018-05-03 DIAGNOSIS — Z90411 Acquired partial absence of pancreas: Secondary | ICD-10-CM | POA: Diagnosis not present

## 2018-05-03 DIAGNOSIS — Z885 Allergy status to narcotic agent status: Secondary | ICD-10-CM | POA: Diagnosis not present

## 2018-05-03 LAB — CBC WITH DIFFERENTIAL/PLATELET
Abs Immature Granulocytes: 0.02 10*3/uL (ref 0.00–0.07)
BASOS ABS: 0 10*3/uL (ref 0.0–0.1)
BASOS PCT: 0 %
EOS PCT: 1 %
Eosinophils Absolute: 0.1 10*3/uL (ref 0.0–0.5)
HCT: 35.2 % — ABNORMAL LOW (ref 36.0–46.0)
Hemoglobin: 11.3 g/dL — ABNORMAL LOW (ref 12.0–15.0)
Immature Granulocytes: 0 %
Lymphocytes Relative: 20 %
Lymphs Abs: 1.4 10*3/uL (ref 0.7–4.0)
MCH: 29 pg (ref 26.0–34.0)
MCHC: 32.1 g/dL (ref 30.0–36.0)
MCV: 90.3 fL (ref 80.0–100.0)
Monocytes Absolute: 0.8 10*3/uL (ref 0.1–1.0)
Monocytes Relative: 11 %
Neutro Abs: 5 10*3/uL (ref 1.7–7.7)
Neutrophils Relative %: 68 %
PLATELETS: 190 10*3/uL (ref 150–400)
RBC: 3.9 MIL/uL (ref 3.87–5.11)
RDW: 13.3 % (ref 11.5–15.5)
WBC: 7.4 10*3/uL (ref 4.0–10.5)
nRBC: 0 % (ref 0.0–0.2)

## 2018-05-03 LAB — HEPATIC FUNCTION PANEL
ALT: 362 U/L — ABNORMAL HIGH (ref 0–44)
AST: 281 U/L — ABNORMAL HIGH (ref 15–41)
Albumin: 3.1 g/dL — ABNORMAL LOW (ref 3.5–5.0)
Alkaline Phosphatase: 248 U/L — ABNORMAL HIGH (ref 38–126)
Bilirubin, Direct: 1 mg/dL — ABNORMAL HIGH (ref 0.0–0.2)
Indirect Bilirubin: 2.8 mg/dL — ABNORMAL HIGH (ref 0.3–0.9)
Total Bilirubin: 3.8 mg/dL — ABNORMAL HIGH (ref 0.3–1.2)
Total Protein: 6.5 g/dL (ref 6.5–8.1)

## 2018-05-03 LAB — CBG MONITORING, ED
GLUCOSE-CAPILLARY: 189 mg/dL — AB (ref 70–99)
Glucose-Capillary: 191 mg/dL — ABNORMAL HIGH (ref 70–99)

## 2018-05-03 LAB — BASIC METABOLIC PANEL
Anion gap: 7 (ref 5–15)
BUN: 8 mg/dL (ref 8–23)
CALCIUM: 8.4 mg/dL — AB (ref 8.9–10.3)
CO2: 22 mmol/L (ref 22–32)
Chloride: 107 mmol/L (ref 98–111)
Creatinine, Ser: 0.51 mg/dL (ref 0.44–1.00)
GFR calc Af Amer: >60 mL/min (ref >60)
Glucose, Bld: 207 mg/dL — ABNORMAL HIGH (ref 70–99)
Potassium: 3.7 mmol/L (ref 3.5–5.1)
Sodium: 136 mmol/L (ref 135–145)

## 2018-05-03 LAB — GLUCOSE, CAPILLARY
Glucose-Capillary: 134 mg/dL — ABNORMAL HIGH (ref 70–99)
Glucose-Capillary: 181 mg/dL — ABNORMAL HIGH (ref 70–99)

## 2018-05-03 LAB — PROTIME-INR
INR: 1.23
Prothrombin Time: 15.4 seconds — ABNORMAL HIGH (ref 11.4–15.2)

## 2018-05-03 MED ORDER — INSULIN ASPART 100 UNIT/ML ~~LOC~~ SOLN
0.0000 [IU] | Freq: Three times a day (TID) | SUBCUTANEOUS | Status: DC
  Administered 2018-05-03 – 2018-05-04 (×4): 2 [IU] via SUBCUTANEOUS
  Filled 2018-05-03 (×2): qty 1

## 2018-05-03 MED ORDER — GADOBUTROL 1 MMOL/ML IV SOLN
5.0000 mL | Freq: Once | INTRAVENOUS | Status: AC | PRN
  Administered 2018-05-03: 5 mL via INTRAVENOUS

## 2018-05-03 MED ORDER — SODIUM CHLORIDE 0.9 % IV SOLN
1.0000 g | Freq: Two times a day (BID) | INTRAVENOUS | Status: DC
  Administered 2018-05-03 – 2018-05-04 (×2): 1 g via INTRAVENOUS
  Filled 2018-05-03 (×4): qty 1

## 2018-05-03 MED ORDER — FLUTICASONE FUROATE-VILANTEROL 200-25 MCG/INH IN AEPB
1.0000 | INHALATION_SPRAY | Freq: Every day | RESPIRATORY_TRACT | Status: DC
  Administered 2018-05-03: 1 via RESPIRATORY_TRACT
  Filled 2018-05-03: qty 28

## 2018-05-03 MED ORDER — PANCRELIPASE (LIP-PROT-AMYL) 12000-38000 UNITS PO CPEP
36000.0000 [IU] | ORAL_CAPSULE | Freq: Three times a day (TID) | ORAL | Status: DC
  Administered 2018-05-03 – 2018-05-04 (×3): 36000 [IU] via ORAL
  Filled 2018-05-03 (×2): qty 1
  Filled 2018-05-03 (×2): qty 3
  Filled 2018-05-03 (×6): qty 1

## 2018-05-03 NOTE — ED Notes (Signed)
Pt ambulatory in dept  In NAD.

## 2018-05-03 NOTE — Progress Notes (Signed)
ANTIBIOTIC CONSULT NOTE-Preliminary  Pharmacy Consult for meropenem Indication: intra-abdominal infection  Allergies  Allergen Reactions  . Biaxin [Clarithromycin] Other (See Comments)    Hallucinations  . Carafate [Sucralfate] Other (See Comments)    Flared up her vertigo, run her blood sugar up and caused constipation  . Codeine Other (See Comments)    Hallucinations   . Doxycycline Other (See Comments)    GI upset , turned stool orange  . Meat [Alpha-Gal]     RED MEAT   . Metronidazole Other (See Comments)    Heart Palpations -flagyl  . Cephalosporins Hives and Palpitations    Patient Measurements: Height: 4\' 11"  (149.9 cm) Weight: 148 lb 13 oz (67.5 kg) IBW/kg (Calculated) : 43.2 Adjusted Body Weight:   Vital Signs: Temp: 98.3 F (36.8 C) (01/05 2318) Temp Source: Oral (01/05 2318) BP: 120/61 (01/05 2318) Pulse Rate: 81 (01/05 2318)  Labs: Recent Labs    05/02/18 1932  WBC 9.3  HGB 12.5  PLT 229  CREATININE 0.58    Estimated Creatinine Clearance: 49.2 mL/min (by C-G formula based on SCr of 0.58 mg/dL).  No results for input(s): VANCOTROUGH, VANCOPEAK, VANCORANDOM, GENTTROUGH, GENTPEAK, GENTRANDOM, TOBRATROUGH, TOBRAPEAK, TOBRARND, AMIKACINPEAK, AMIKACINTROU, AMIKACIN in the last 72 hours.   Microbiology: No results found for this or any previous visit (from the past 720 hour(s)).  Medical History: Past Medical History:  Diagnosis Date  . Anxiety   . Anxiety and depression   . Asthma   . Breast carcinoma ([place]) 08/2009   Invasive ductal; left; lumpectomy and sentinel node excision- oncology visits yearly.  . Chest pain   . Depression   . Diabetes mellitus type II, controlled ([place])    diet controlled,exercise, no meds  . Diverticulitis 2012   Treated medically  . Gastroesophageal reflux   . Hyperlipidemia   . Nephrolithiasis   . Tick bite    [place] Tick, alpha gal positive, makes her allergic to meat    Medications:  Scheduled:    Infusions:  . 0.9 % NaCl with KCl 20 mEq / L 100 mL/hr at 05/02/18 2314  . famotidine (PEPCID) IV 20 mg (05/02/18 2337)  . meropenem (MERREM) IV     Anti-infectives (From admission, onward)   Start     Dose/Rate Route Frequency Ordered Stop   05/03/18 0500  meropenem (MERREM) 2 g in sodium chloride 0.9 % 100 mL IVPB     2 g 200 mL/hr over 30 Minutes Intravenous  Once 05/02/18 2255     05/02/18 2115  imipenem-cilastatin (PRIMAXIN) 500 mg in sodium chloride 0.9 % 100 mL IVPB     500 mg 200 mL/hr over 30 Minutes Intravenous  Once 05/02/18 2100 05/02/18 2312   05/02/18 2045  levofloxacin (LEVAQUIN) IVPB 750 mg  Status:  Discontinued     750 mg 100 mL/hr over 90 Minutes Intravenous  Once 05/02/18 2030 05/02/18 2059      Assessment: 78 yo female presented with fever.  Evidence of biliary tract disease with bili 3.1 and LFT in 500s.  Starting abx for possible cholangitis.    Plan:  Preliminary review of pertinent patient information completed.  Protocol will be initiated with dose(s) of meropenem 2 grams x 1 at 0500, 8 hours after imipenem dose in ED.  Forestine Na clinical pharmacist will complete review during morning rounds to assess patient and finalize treatment regimen if needed.  Jefrey Raburn Scarlett, RPH 05/03/2018,2:19 AM

## 2018-05-03 NOTE — ED Notes (Signed)
Pt to MRI

## 2018-05-03 NOTE — Progress Notes (Signed)
Subjective: She came to the emergency room with an approximately 36-hour history of fever and chills.  She did not want to eat.  She did not have any abdominal pain however except some chronic mild left lower quadrant pain which she has had for several months.  Imaging consistent with cholangitis.  She says she feels okay.  She is very anxious.  No nausea or vomiting.  No pain except as mentioned the mild left lower quadrant pain  Objective: Vital signs in last 24 hours: Temp:  [98.3 F (36.8 C)-101.8 F (38.8 C)] 98.3 F (36.8 C) (01/05 2318) Pulse Rate:  [81-98] 81 (01/06 0635) Resp:  [16-17] 17 (01/06 0635) BP: (109-132)/(59-76) 130/67 (01/06 0635) SpO2:  [96 %-98 %] 98 % (01/06 0635) Weight:  [67.5 kg] 67.5 kg (01/05 1811) Weight change:     Intake/Output from previous day: 01/05 0701 - 01/06 0700 In: 1150 [IV Piggyback:1150] Out: -   PHYSICAL EXAM General appearance: alert, cooperative and no distress Resp: clear to auscultation bilaterally Cardio: regular rate and rhythm, S1, S2 normal, no murmur, click, rub or gallop GI: soft, non-tender; bowel sounds normal; no masses,  no organomegaly Extremities: extremities normal, atraumatic, no cyanosis or edema  Lab Results:  Results for orders placed or performed during the hospital encounter of 05/02/18 (from the past 48 hour(s))  Urinalysis, Routine w reflex microscopic     Status: Abnormal   Collection Time: 05/02/18  7:09 PM  Result Value Ref Range   Color, Urine YELLOW YELLOW   APPearance CLEAR CLEAR   Specific Gravity, Urine 1.005 1.005 - 1.030   pH 6.0 5.0 - 8.0   Glucose, UA NEGATIVE NEGATIVE mg/dL   Hgb urine dipstick MODERATE (A) NEGATIVE   Bilirubin Urine NEGATIVE NEGATIVE   Ketones, ur 5 (A) NEGATIVE mg/dL   Protein, ur NEGATIVE NEGATIVE mg/dL   Nitrite NEGATIVE NEGATIVE   Leukocytes, UA NEGATIVE NEGATIVE   RBC / HPF 0-5 0 - 5 RBC/hpf   WBC, UA 0-5 0 - 5 WBC/hpf   Bacteria, UA NONE SEEN NONE SEEN   Squamous  Epithelial / LPF 0-5 0 - 5    Comment: Performed at [facility], [address], [place], [place] [address]  Influenza panel by PCR (type A & B)     Status: None   Collection Time: 05/02/18  7:11 PM  Result Value Ref Range   Influenza A By PCR NEGATIVE NEGATIVE   Influenza B By PCR NEGATIVE NEGATIVE    Comment: (NOTE) The Xpert Xpress Flu assay is intended as an aid in the diagnosis of  influenza and should not be used as a sole basis for treatment.  This  assay is FDA approved for nasopharyngeal swab specimens only. Nasal  washings and aspirates are unacceptable for Xpert Xpress Flu testing. Performed at [facility], [address], [place], [place] [address]   Comprehensive metabolic panel     Status: Abnormal   Collection Time: 05/02/18  7:32 PM  Result Value Ref Range   Sodium 133 (L) 135 - 145 mmol/L   Potassium 3.5 3.5 - 5.1 mmol/L   Chloride 102 98 - 111 mmol/L   CO2 23 22 - 32 mmol/L   Glucose, Bld 212 (H) 70 - 99 mg/dL   BUN 13 8 - 23 mg/dL   Creatinine, Ser 0.58 0.44 - 1.00 mg/dL   Calcium 8.9 8.9 - 10.3 mg/dL   Total Protein 7.4 6.5 - 8.1 g/dL   Albumin 3.6 3.5 - 5.0  g/dL   AST 522 (H) 15 - 41 U/L   ALT 515 (H) 0 - 44 U/L   Alkaline Phosphatase 276 (H) 38 - 126 U/L   Total Bilirubin 3.1 (H) 0.3 - 1.2 mg/dL   GFR calc non Af Amer >60 >60 mL/min   GFR calc Af Amer >60 >60 mL/min   Anion gap 8 5 - 15    Comment: Performed at [facility], [address], [place], [place] [address]  CBC WITH DIFFERENTIAL     Status: None   Collection Time: 05/02/18  7:32 PM  Result Value Ref Range   WBC 9.3 4.0 - 10.5 K/uL   RBC 4.27 3.87 - 5.11 MIL/uL   Hemoglobin 12.5 12.0 - 15.0 g/dL   HCT 38.5 36.0 - 46.0 %   MCV 90.2 80.0 - 100.0 fL   MCH 29.3 26.0 - 34.0 pg   MCHC 32.5 30.0 - 36.0 g/dL   RDW 13.2 11.5 - 15.5 %   Platelets 229 150 - 400 K/uL   nRBC 0.0 0.0 - 0.2 %   Neutrophils Relative % 74 %   Neutro Abs 6.9 1.7 - 7.7 K/uL   Lymphocytes Relative 16 %   Lymphs Abs 1.4  0.7 - 4.0 K/uL   Monocytes Relative 10 %   Monocytes Absolute 0.9 0.1 - 1.0 K/uL   Eosinophils Relative 0 %   Eosinophils Absolute 0.0 0.0 - 0.5 K/uL   Basophils Relative 0 %   Basophils Absolute 0.0 0.0 - 0.1 K/uL   Immature Granulocytes 0 %   Abs Immature Granulocytes 0.03 0.00 - 0.07 K/uL    Comment: Performed at [facility], [address], [place], [place] [address]  Procalcitonin     Status: None   Collection Time: 05/02/18  7:32 PM  Result Value Ref Range   Procalcitonin 0.33 ng/mL    Comment:        Interpretation: PCT (Procalcitonin) <= 0.5 ng/mL: Systemic infection (sepsis) is not likely. Local bacterial infection is possible. (NOTE)       Sepsis PCT Algorithm           Lower Respiratory Tract                                      Infection PCT Algorithm    ----------------------------     ----------------------------         PCT < 0.25 ng/mL                PCT < 0.10 ng/mL         Strongly encourage             Strongly discourage   discontinuation of antibiotics    initiation of antibiotics    ----------------------------     -----------------------------       PCT 0.25 - 0.50 ng/mL            PCT 0.10 - 0.25 ng/mL               OR       >80% decrease in PCT            Discourage initiation of  antibiotics      Encourage discontinuation           of antibiotics    ----------------------------     -----------------------------         PCT >= 0.50 ng/mL              PCT 0.26 - 0.50 ng/mL               AND        <80% decrease in PCT             Encourage initiation of                                             antibiotics       Encourage continuation           of antibiotics    ----------------------------     -----------------------------        PCT >= 0.50 ng/mL                  PCT > 0.50 ng/mL               AND         increase in PCT                  Strongly encourage                                      initiation  of antibiotics    Strongly encourage escalation           of antibiotics                                     -----------------------------                                           PCT <= 0.25 ng/mL                                                 OR                                        > 80% decrease in PCT                                     Discontinue / Do not initiate                                             antibiotics Performed at [facility], [address] [place],  [address]   Magnesium     Status: None   Collection Time: 05/02/18  7:32 PM  Result Value Ref Range   Magnesium 1.7 1.7 - 2.4 mg/dL    Comment: Performed at [facility], [address], [place], [place] [address]  Phosphorus     Status: Abnormal   Collection Time: 05/02/18  7:32 PM  Result Value Ref Range   Phosphorus 2.4 (L) 2.5 - 4.6 mg/dL    Comment: Performed at [facility], [address] [place], [place] [address]  Protime-INR     Status: None   Collection Time: 05/02/18  7:32 PM  Result Value Ref Range   Prothrombin Time 14.6 11.4 - 15.2 seconds   INR 1.15     Comment: Performed at [facility], [address] [place], [place] [address]  I-Stat CG4 Lactic Acid, ED     Status: None   Collection Time: 05/02/18  7:44 PM  Result Value Ref Range   Lactic Acid, Venous 0.96 0.5 - 1.9 mmol/L  CBC WITH DIFFERENTIAL     Status: Abnormal   Collection Time: 05/03/18  4:56 AM  Result Value Ref Range   WBC 7.4 4.0 - 10.5 K/uL   RBC 3.90 3.87 - 5.11 MIL/uL   Hemoglobin 11.3 (L) 12.0 - 15.0 g/dL   HCT 35.2 (L) 36.0 - 46.0 %   MCV 90.3 80.0 - 100.0 fL   MCH 29.0 26.0 - 34.0 pg   MCHC 32.1 30.0 - 36.0 g/dL   RDW 13.3 11.5 - 15.5 %   Platelets 190 150 - 400 K/uL   nRBC 0.0 0.0 - 0.2 %   Neutrophils Relative % 68 %   Neutro Abs 5.0 1.7 - 7.7 K/uL   Lymphocytes Relative 20 %   Lymphs Abs 1.4 0.7 - 4.0 K/uL   Monocytes Relative 11 %   Monocytes Absolute 0.8 0.1 - 1.0 K/uL   Eosinophils Relative 1 %    Eosinophils Absolute 0.1 0.0 - 0.5 K/uL   Basophils Relative 0 %   Basophils Absolute 0.0 0.0 - 0.1 K/uL   Immature Granulocytes 0 %   Abs Immature Granulocytes 0.02 0.00 - 0.07 K/uL    Comment: Performed at [facility], [address] [place], [place] [address]  Protime-INR     Status: Abnormal   Collection Time: 05/03/18  4:56 AM  Result Value Ref Range   Prothrombin Time 15.4 (H) 11.4 - 15.2 seconds   INR 1.23     Comment: Performed at [facility], [address] [place], [place] [address]  Hepatic function panel     Status: Abnormal   Collection Time: 05/03/18  4:56 AM  Result Value Ref Range   Total Protein 6.5 6.5 - 8.1 g/dL   Albumin 3.1 (L) 3.5 - 5.0 g/dL   AST 281 (H) 15 - 41 U/L   ALT 362 (H) 0 - 44 U/L   Alkaline Phosphatase 248 (H) 38 - 126 U/L   Total Bilirubin 3.8 (H) 0.3 - 1.2 mg/dL   Bilirubin, Direct 1.0 (H) 0.0 - 0.2 mg/dL   Indirect Bilirubin 2.8 (H) 0.3 - 0.9 mg/dL    Comment: Performed at [facility], [address] [place],  [address]  Basic metabolic panel     Status: Abnormal   Collection Time: 05/03/18  4:56 AM  Result Value Ref Range   Sodium 136 135 - 145 mmol/L   Potassium 3.7 3.5 - 5.1 mmol/L   Chloride 107 98 - 111 mmol/L   CO2 22 22 - 32 mmol/L   Glucose, Bld 207 (H) 70 - 99 mg/dL  BUN 8 8 - 23 mg/dL   Creatinine, Ser 0.51 0.44 - 1.00 mg/dL   Calcium 8.4 (L) 8.9 - 10.3 mg/dL   GFR calc non Af Amer >60 >60 mL/min   GFR calc Af Amer >60 >60 mL/min   Anion gap 7 5 - 15    Comment: Performed at [facility], [address], [place], [place] [address]    ABGS No results for input(s): PHART, PO2ART, TCO2, HCO3 in the last 72 hours.  Invalid input(s): PCO2 CULTURES No results found for this or any previous visit (from the past 240 hour(s)). Studies/Results: Dg Chest 2 View  Result Date: 05/02/2018 CLINICAL DATA:  Pt reports fever last night and today. Denies other symptoms. Denies urinary symptoms/denies SOB/ denies body aches.  Only reports decreased appetite. Reports fever around 5pm today was 102.4HISTORY OF CANCER, ASTHMA, DM EXAM: CHEST - 2 VIEW COMPARISON:  01/04/2018 FINDINGS: Cardiac silhouette is normal in size. Normal mediastinal and hilar contours. Clear lungs.  No pleural effusion or pneumothorax. Skeletal structures are intact. IMPRESSION: No active cardiopulmonary disease. Electronically Signed   By: Lajean Manes M.D.   On: 05/02/2018 20:37   Ct Abdomen Pelvis W Contrast  Result Date: 05/02/2018 CLINICAL DATA:  Elevated bilirubin and LFTs,, abdominal pain, prior Whipple procedure, type II diabetes mellitus, GERD, breast cancer post LEFT lumpectomy EXAM: CT ABDOMEN AND PELVIS WITH CONTRAST TECHNIQUE: Multidetector CT imaging of the abdomen and pelvis was performed using the standard protocol following bolus administration of intravenous contrast. Sagittal and coronal MPR images reconstructed from axial data set. CONTRAST:  187mL ISOVUE-300 IOPAMIDOL (ISOVUE-300) INJECTION 61% IV. No oral contrast. COMPARISON:  01/10/2017 FINDINGS: Lower chest: Lung bases clear Hepatobiliary: Gallbladder surgically absent. Cyst lateral segment LEFT lobe liver 14 x 13 mm decreased from the 29 x 21 mm on the previous exam. No other focal hepatic abnormalities. The proximal CBD is mildly prominent at 10 mm diameter though only minimal central intrahepatic biliary dilatation is seen. Pancreas: Prior resection of the pancreatic head and portion of body post Whipple. Atrophy of the remaining pancreatic body and tail with ductal dilatation similar to previous exam. No definite pancreatic mass. Spleen: Normal appearance Adrenals/Urinary Tract: Diffuse thickening of the LEFT adrenal gland without discrete mass. Tiny cyst RIGHT kidney. Adrenal glands, kidneys, ureters and bladder otherwise normal appearance. Stomach/Bowel: Normal appendix. Prior antrectomy with residual stomach demonstrating prominent wall though this may be an artifact related to  underdistention. Scattered stool throughout colon. Minimal distal colonic diverticulosis. Unopacified small bowel loops. Soft tissue at the porta hepatis and adjacent to the LEFT lobe of the liver likely represents unopacified small bowel and choledochojejunostomy. Vascular/Lymphatic: Atherosclerotic calcifications aorta and iliac arteries without aneurysm. Few scattered normal size mesenteric lymph nodes in RIGHT mid abdomen. Few normal sized lymph nodes are also seen anterior to the IVC in the upper abdomen and LEFT para-aortic. No adenopathy. Reproductive: Unremarkable uterus and adnexa Other: No free air or free fluid. No hernia or acute inflammatory process. Musculoskeletal: No acute osseous findings. Bones demineralized. Again seen potential vertebral hemangioma at T12. IMPRESSION: Post Whipple procedure with mild dilatation of the CBD 10 mm diameter associated with minimal intrahepatic biliary dilatation; recommend correlation with LFTs. No other definite intra-abdominal or intrapelvic abnormalities. Electronically Signed   By: Lavonia Dana M.D.   On: 05/02/2018 22:30    Medications:  Prior to Admission: (Not in a hospital admission)  Scheduled: . fluticasone furoate-vilanterol  1 puff Inhalation Daily  . insulin aspart  0-9  Units Subcutaneous TID WC  . lipase/protease/amylase  36,000 Units Oral TID WC   Continuous: . 0.9 % NaCl with KCl 20 mEq / L 100 mL/hr at 05/02/18 2314  . famotidine (PEPCID) IV Stopped (05/03/18 0446)  . meropenem (MERREM) IV     PRX:[id number] **OR** ondansetron (ZOFRAN) IV  Assesment: She has acute cholangitis and she is on meropenem.  GI consult has been requested  She has diabetes which has been much worse since she had partial pancreatectomy.  Her blood sugar has been doing fairly well however  She has alpha gal syndrome with meat allergy  She has significant anxiety  She has asthma which is stable  She has exocrine pancreatic insufficiency on  replacement  She has personal history of breast cancer which is stable   Principal Problem:   Acute cholangitis Active Problems:   Gastroesophageal reflux   Asthma   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[place])   Depression   Hyperlipidemia   Hyponatremia   Cholangitis    Plan: Full admission.  Continue meropenem.  GI consult.  No other changes.    LOS: 0 days   Betty Nguyen 05/03/2018, 7:24 AM

## 2018-05-03 NOTE — Progress Notes (Signed)
Pharmacy Antibiotic Note  Betty Nguyen is a 78 y.o. female admitted on 05/02/2018 with acute cholangitis. Pharmacy has been consulted for meropenem dosing.  Plan: Give meropenem 2g IV x1 dose Start meropenem 1g IV q12h Pharmacy will continue to monitor patient progress, labs and culture data.  Height: 4\' 11"  (149.9 cm) Weight: 148 lb 13 oz (67.5 kg) IBW/kg (Calculated) : 43.2  Temp (24hrs), Avg:99.8 F (37.7 C), Min:98.3 F (36.8 C), Max:101.8 F (38.8 C)  Recent Labs  Lab 05/02/18 1932 05/02/18 1944 05/03/18 0456  WBC 9.3  --  7.4  CREATININE 0.58  --  0.51  LATICACIDVEN  --  0.96  --     Estimated Creatinine Clearance: 49.2 mL/min (by C-G formula based on SCr of 0.51 mg/dL).    Allergies  Allergen Reactions  . Biaxin [Clarithromycin] Other (See Comments)    Hallucinations  . Carafate [Sucralfate] Other (See Comments)    Flared up her vertigo, run her blood sugar up and caused constipation  . Codeine Other (See Comments)    Hallucinations   . Doxycycline Other (See Comments)    GI upset , turned stool orange  . Meat [Alpha-Gal]     RED MEAT   . Metronidazole Other (See Comments)    Heart Palpations -flagyl  . Cephalosporins Hives and Palpitations    Antimicrobials this admission: 1/5 imipenem>> x1 dose in ED   1/6 meropenem  >>    Microbiology results:  1/5  UCx:      Thank you for allowing pharmacy to be a part of this patient's care.  Despina Pole 05/03/2018 12:11 PM

## 2018-05-03 NOTE — Consult Note (Signed)
Referring Provider: Dr. Olevia Bowens Primary Care Physician:  Sinda Du, MD Primary Gastroenterologist:  Dr. Ardis Hughs ([facility])   Date of Admission: 05/02/2018 Date of Consultation: 05/03/2018  Reason for Consultation:  Concern for cholangitis  HPI:  Betty Nguyen is a 78 y.o. year old female with a complicated past medical history multiple pancreatic cysts and a dominant head of pancreas cyst that noted interval changes in the past, s/p pancreaticoduodenectomy Sept 2016 by Dr. Eugenia Pancoast at [facility]. 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 She underwent cholecystectomy at time of surgery. Primary GI Dr. Ardis Hughs, who had been following her for known pancreatic cysts. Presented to ED yesterday evening with fever reported around 102.4, associated chills, and a mild "hacking cough". Acutely elevated transaminases in the 500 range, alk phos 276, bilirubin 3.1. CT abd/pelvis with contrast prior Whipple procedure, mild dilatation of CBD at 10 mm associated with minimal intrahepatic biliary dilatation. No leukocytosis noted, and Tmax while in the ED yesterday evening was 101.8. Afebrile currently. Empirically started on antibiotics.  Patient denies any RUQ pain. She does note a 3-week-history of LLQ stinging/pulling sensation around site of prior hernia repair earlier this year. States this started after climbing up into her SUV 3 weeks ago and is exacerbated my movement. No N/V. She has poorly controlled diabetes, and she has a CGM censor to avoid frequent finger pricks. Unable to complete MRI without removing, but this can be easily applied again after MRI. Patient removes this on own at home.   Review of epic reveals mildly fluctuating transaminases intermittently in 2019 and 2018, with return to baseline in between. Jan 2017 presented to the ED with AST 211, ALT 89, Alk Phos 136. CBD measuring up to 7 mm Sept 2018. CBD 10 mm on CT abd/pelvis with contrast yesterday.    Past  Medical History:  Diagnosis Date  . Anxiety   . Anxiety and depression   . Asthma   . Breast carcinoma ([place]) 08/2009   Invasive ductal; left; lumpectomy and sentinel node excision- oncology visits yearly.  . Chest pain   . Depression   . Diabetes mellitus type II, controlled ([place])    diet controlled,exercise, no meds  . Diverticulitis 2012   Treated medically  . Gastroesophageal reflux   . Hyperlipidemia   . Nephrolithiasis   . Tick bite    [place] Tick, alpha gal positive, makes her allergic to meat    Past Surgical History:  Procedure Laterality Date  . BREAST LUMPECTOMY     Left  . CESAREAN SECTION    . CHOLECYSTECTOMY    . COLONOSCOPY  2004  . DILATION AND CURETTAGE OF UTERUS    . EUS N/A 09/07/2014   Procedure: UPPER ENDOSCOPIC ULTRASOUND (EUS) LINEAR;  Surgeon: Milus Banister, MD;  Location: [facility];  Service: Endoscopy;  Laterality: N/A;  . HERNIA REPAIR    . NEPHROLITHOTOMY    . TUBAL LIGATION    . WHIPPLE PROCEDURE      Prior to Admission medications   Medication Sig Start Date End Date Taking? Authorizing Provider  albuterol (PROVENTIL HFA;VENTOLIN HFA) 108 (90 BASE) MCG/ACT inhaler Inhale 2 puffs into the lungs every 6 (six) hours as needed for wheezing.   Yes [provider]  dicyclomine (BENTYL) 10 MG capsule Take 1 tablet once or twice daily as needed for abdominal pain and gas. Patient taking differently: Take 10 mg by mouth 2 (two) times daily as needed (FOR ABDOMINAL PAIN/GAS).  10/07/16  Yes Esterwood, Amy S, PA-C  fluticasone furoate-vilanterol (BREO ELLIPTA) 200-25 MCG/INH AEPB Inhale 1 puff into the lungs daily.   Yes [provider]  HUMALOG KWIKPEN 100 UNIT/ML KiwkPen INJECT 4 TO 10 UNITS INTO THE SKIN 3 TIMES DAILY WITH MEALS. Patient taking differently: Inject 4-5 Units into the skin 3 (three) times daily before meals. DEPENDING ON BLOOD SUGAR LEVELS. TAKES AS NEEDED 02/08/18  Yes Nida, Marella Chimes, MD  Insulin Glargine  (TOUJEO MAX SOLOSTAR) 300 UNIT/ML SOPN Inject 12 Units into the skin at bedtime. Patient taking differently: Inject 9-12 Units into the skin at bedtime as needed (FOR HIGH BLOOD SUGAR LEVELS OVER 230).  02/01/18  Yes Nida, Marella Chimes, MD  omeprazole (PRILOSEC) 20 MG capsule Take 1 capsule (20 mg total) by mouth daily. 05/04/14  Yes Davonna Belling, MD  Pancrelipase, Lip-Prot-Amyl, (CREON) 24000-76000 units CPEP TAKE 1 CAPSULE WITH MEALS AND SNACKS UP TO 6 A DAY Patient taking differently: Take 1 capsule by mouth 3 (three) times daily with meals. TAKE 1 CAPSULE WITH MEALS AND SNACKS UP TO 6 A DAY 09/23/17  Yes Nida, Marella Chimes, MD  valACYclovir (VALTREX) 500 MG tablet Take 500 mg by mouth daily as needed (outbreak).    Yes [provider]    Current Facility-Administered Medications  Medication Dose Route Frequency Provider Last Rate Last Dose  . 0.9 % NaCl with KCl 20 mEq/ L  infusion   Intravenous Continuous Reubin Milan, MD 100 mL/hr at 05/02/18 2314    . famotidine (PEPCID) IVPB 20 mg premix  20 mg Intravenous Q12H Reubin Milan, MD   Stopped at 05/03/18 [phone]  . fluticasone furoate-vilanterol (BREO ELLIPTA) 200-25 MCG/INH 1 puff  1 puff Inhalation Daily Reubin Milan, MD   1 puff at 05/03/18 1221  . insulin aspart (novoLOG) injection 0-9 Units  0-9 Units Subcutaneous TID WC Reubin Milan, MD   2 Units at 05/03/18 1010  . lipase/protease/amylase (CREON) capsule 36,000 Units  36,000 Units Oral TID WC Reubin Milan, MD   36,000 Units at 05/03/18 1010  . meropenem (MERREM) 1 g in sodium chloride 0.9 % 100 mL IVPB  1 g Intravenous Q12H Sinda Du, MD      . ondansetron [facility] tablet 4 mg  4 mg Oral Q6H PRN Reubin Milan, MD       Or  . ondansetron [facility]) injection 4 mg  4 mg Intravenous Q6H PRN Reubin Milan, MD       Current Outpatient Medications  Medication Sig Dispense Refill  . albuterol (PROVENTIL HFA;VENTOLIN HFA) 108 (90  BASE) MCG/ACT inhaler Inhale 2 puffs into the lungs every 6 (six) hours as needed for wheezing.    . dicyclomine (BENTYL) 10 MG capsule Take 1 tablet once or twice daily as needed for abdominal pain and gas. (Patient taking differently: Take 10 mg by mouth 2 (two) times daily as needed (FOR ABDOMINAL PAIN/GAS). ) 30 capsule 3  . fluticasone furoate-vilanterol (BREO ELLIPTA) 200-25 MCG/INH AEPB Inhale 1 puff into the lungs daily.    Marland Kitchen HUMALOG KWIKPEN 100 UNIT/ML KiwkPen INJECT 4 TO 10 UNITS INTO THE SKIN 3 TIMES DAILY WITH MEALS. (Patient taking differently: Inject 4-5 Units into the skin 3 (three) times daily before meals. DEPENDING ON BLOOD SUGAR LEVELS. TAKES AS NEEDED) 9 mL 2  . Insulin Glargine (TOUJEO MAX SOLOSTAR) 300 UNIT/ML SOPN Inject 12 Units into the skin at bedtime. (Patient taking differently: Inject 9-12 Units into the  skin at bedtime as needed (FOR HIGH BLOOD SUGAR LEVELS OVER 230). ) 3 pen 2  . omeprazole (PRILOSEC) 20 MG capsule Take 1 capsule (20 mg total) by mouth daily. 14 capsule 0  . Pancrelipase, Lip-Prot-Amyl, (CREON) 24000-76000 units CPEP TAKE 1 CAPSULE WITH MEALS AND SNACKS UP TO 6 A DAY (Patient taking differently: Take 1 capsule by mouth 3 (three) times daily with meals. TAKE 1 CAPSULE WITH MEALS AND SNACKS UP TO 6 A DAY) 180 capsule 2  . valACYclovir (VALTREX) 500 MG tablet Take 500 mg by mouth daily as needed (outbreak).       Allergies as of 05/02/2018 - Review Complete 05/02/2018  Allergen Reaction Noted  . Biaxin [clarithromycin] Other (See Comments) 11/25/2012  . Carafate [sucralfate] Other (See Comments) 10/07/2016  . Codeine Other (See Comments) 08/21/2010  . Doxycycline Other (See Comments) 10/07/2016  . Meat [alpha-gal]  05/02/2018  . Metronidazole Other (See Comments) 11/25/2012  . Cephalosporins Hives and Palpitations 11/25/2012    Family History  Problem Relation Age of Onset  . Coronary artery disease Mother   . Stroke Father   . Diabetes Mellitus  II Father   . Colon cancer Neg Hx     Social History   Socioeconomic History  . Marital status: Divorced    Spouse name: Not on file  . Number of children: 2  . Years of education: Not on file  . Highest education level: Not on file  Occupational History  . Occupation: Tourist information centre manager for [employer]  Social Needs  . Financial resource strain: Not on file  . Food insecurity:    Worry: Not on file    Inability: Not on file  . Transportation needs:    Medical: Not on file    Non-medical: Not on file  Tobacco Use  . Smoking status: Never Smoker  . Smokeless tobacco: Never Used  Substance and Sexual Activity  . Alcohol use: No  . Drug use: No  . Sexual activity: Not Currently  Lifestyle  . Physical activity:    Days per week: Not on file    Minutes per session: Not on file  . Stress: Not on file  Relationships  . Social connections:    Talks on phone: Not on file    Gets together: Not on file    Attends religious service: Not on file    Active member of club or organization: Not on file    Attends meetings of clubs or organizations: Not on file    Relationship status: Not on file  . Intimate partner violence:    Fear of current or ex partner: Not on file    Emotionally abused: Not on file    Physically abused: Not on file    Forced sexual activity: Not on file  Other Topics Concern  . Not on file  Social History Narrative  . Not on file    Review of Systems: Gen: see HPI  CV: Denies chest pain, heart palpitations, syncope, edema  Resp: Denies shortness of breath with rest, cough, wheezing GI: see HPI  GU : Denies urinary burning, urinary frequency, urinary incontinence.  MS: Denies joint pain,swelling, cramping Derm: Denies rash, itching, dry skin Psych: Denies depression, anxiety,confusion, or memory loss Heme: Denies bruising, bleeding, and enlarged lymph nodes.  Physical Exam: Vital signs in last 24 hours: Temp:  [98.3 F (36.8 C)-101.8 F (38.8 C)] 98.3 F (36.8 C)  (01/05 2318) Pulse Rate:  [73-98] 76 (01/06 0830) Resp:  [15-22] 17 (  01/06 0830) BP: (109-132)/(59-76) 126/64 (01/06 0830) SpO2:  [95 %-99 %] 95 % (01/06 1224) Weight:  [67.5 kg] 67.5 kg (01/05 1811)   General:   Alert,  Well-developed, well-nourished, pleasant and cooperative in NAD Head:  Normocephalic and atraumatic. Eyes:  Sclera clear, no icterus.   Conjunctiva pink. Ears:  Normal auditory acuity. Nose:  No deformity, discharge,  or lesions. Mouth:  No deformity or lesions, dentition normal. Lungs:  Clear throughout to auscultation.   Heart:  S1 S2 present without murmurs Abdomen:  Soft, nontender and nondistended. No masses, hepatosplenomegaly or hernias noted. Normal bowel sounds, without guarding, and without rebound.   Rectal:  Deferred  Msk:  Symmetrical without gross deformities. Normal posture. Extremities:  Without  edema. Neurologic:  Alert and  oriented x4 Skin:  Intact without significant lesions or rashes. Psych:  Alert and cooperative. Normal mood and affect.  Intake/Output from previous day: 01/05 0701 - 01/06 0700 In: 1150 [IV Piggyback:1150] Out: -  Intake/Output this shift: No intake/output data recorded.  Lab Results: Recent Labs    05/02/18 1932 05/03/18 0456  WBC 9.3 7.4  HGB 12.5 11.3*  HCT 38.5 35.2*  PLT 229 190   BMET Recent Labs    05/02/18 1932 05/03/18 0456  NA 133* 136  K 3.5 3.7  CL 102 107  CO2 23 22  GLUCOSE 212* 207*  BUN 13 8  CREATININE 0.58 0.51  CALCIUM 8.9 8.4*   LFT Recent Labs    05/02/18 1932 05/03/18 0456  PROT 7.4 6.5  ALBUMIN 3.6 3.1*  AST 522* 281*  ALT 515* 362*  ALKPHOS 276* 248*  BILITOT 3.1* 3.8*  BILIDIR  --  1.0*  IBILI  --  2.8*   PT/INR Recent Labs    05/02/18 1932 05/03/18 0456  LABPROT 14.6 15.4*  INR 1.15 1.23    Studies/Results: Dg Chest 2 View  Result Date: 05/02/2018 CLINICAL DATA:  Pt reports fever last night and today. Denies other symptoms. Denies urinary symptoms/denies  SOB/ denies body aches. Only reports decreased appetite. Reports fever around 5pm today was 102.4HISTORY OF CANCER, ASTHMA, DM EXAM: CHEST - 2 VIEW COMPARISON:  01/04/2018 FINDINGS: Cardiac silhouette is normal in size. Normal mediastinal and hilar contours. Clear lungs.  No pleural effusion or pneumothorax. Skeletal structures are intact. IMPRESSION: No active cardiopulmonary disease. Electronically Signed   By: Lajean Manes M.D.   On: 05/02/2018 20:37   Ct Abdomen Pelvis W Contrast  Result Date: 05/02/2018 CLINICAL DATA:  Elevated bilirubin and LFTs,, abdominal pain, prior Whipple procedure, type II diabetes mellitus, GERD, breast cancer post LEFT lumpectomy EXAM: CT ABDOMEN AND PELVIS WITH CONTRAST TECHNIQUE: Multidetector CT imaging of the abdomen and pelvis was performed using the standard protocol following bolus administration of intravenous contrast. Sagittal and coronal MPR images reconstructed from axial data set. CONTRAST:  192m ISOVUE-300 IOPAMIDOL (ISOVUE-300) INJECTION 61% IV. No oral contrast. COMPARISON:  01/10/2017 FINDINGS: Lower chest: Lung bases clear Hepatobiliary: Gallbladder surgically absent. Cyst lateral segment LEFT lobe liver 14 x 13 mm decreased from the 29 x 21 mm on the previous exam. No other focal hepatic abnormalities. The proximal CBD is mildly prominent at 10 mm diameter though only minimal central intrahepatic biliary dilatation is seen. Pancreas: Prior resection of the pancreatic head and portion of body post Whipple. Atrophy of the remaining pancreatic body and tail with ductal dilatation similar to previous exam. No definite pancreatic mass. Spleen: Normal appearance Adrenals/Urinary Tract: Diffuse thickening of the LEFT adrenal gland  without discrete mass. Tiny cyst RIGHT kidney. Adrenal glands, kidneys, ureters and bladder otherwise normal appearance. Stomach/Bowel: Normal appendix. Prior antrectomy with residual stomach demonstrating prominent wall though this may be an  artifact related to underdistention. Scattered stool throughout colon. Minimal distal colonic diverticulosis. Unopacified small bowel loops. Soft tissue at the porta hepatis and adjacent to the LEFT lobe of the liver likely represents unopacified small bowel and choledochojejunostomy. Vascular/Lymphatic: Atherosclerotic calcifications aorta and iliac arteries without aneurysm. Few scattered normal size mesenteric lymph nodes in RIGHT mid abdomen. Few normal sized lymph nodes are also seen anterior to the IVC in the upper abdomen and LEFT para-aortic. No adenopathy. Reproductive: Unremarkable uterus and adnexa Other: No free air or free fluid. No hernia or acute inflammatory process. Musculoskeletal: No acute osseous findings. Bones demineralized. Again seen potential vertebral hemangioma at T12. IMPRESSION: Post Whipple procedure with mild dilatation of the CBD 10 mm diameter associated with minimal intrahepatic biliary dilatation; recommend correlation with LFTs. No other definite intra-abdominal or intrapelvic abnormalities. Electronically Signed   By: Lavonia Dana M.D.   On: 05/02/2018 22:30    Impression: Pleasant 78 year old female with history of multiple pancreatic cysts and dominant head of pancreas cyst in the past, s/p pancreaticoduodenectomy Sept 2016 by Dr. 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 Presenting now with 2 day history of chills and noted fever of 102.4 two days ago. Interestingly, she has had no typical RUQ pain, nausea, vomiting, and her only discomfort is in LLQ that seems consistent with musculoskeletal etiology. CT imaging completed with CBD 10 mm and mixed pattern elevated LFTs. Transaminases have improved since yesterday, and bilirubin slightly increased from yesterday. Currently afebrile, and has been started on antibiotics in case of evolving cholangitis.   She has been seen by Dr. Ardis Hughs historically, and most recently has  been followed closely at [facility] by Dr. Eugenia Pancoast post-surgery. Will pursue MRI/MRCP now to further evaluate, continue empiric antibiotics, and may ultimately need transfer to [facility] if need for therapeutic or diagnostic intervention.   Plan: MRI/MRCP today May need transfer to [place] depending on MRI findings Continue empiric antibiotics for now Will need replacement of insulin sensor by staff as this was removed prior to MRI: patient has at home Further recommendations to follow. Discussed with Dr. Luan Pulling.  Annitta Needs, PhD, ANP-BC [facility]      LOS: 0 days    05/03/2018, 12:32 PM

## 2018-05-03 NOTE — ED Notes (Signed)
Coverage for lunch relief. First contact with pt. Antibiotic treatment infused without adverse or allergic reaction noted. Pt denies pain or discomfort at present time. No new complaints or concerns verbalized. Questions addressed concerning plan of care.

## 2018-05-04 ENCOUNTER — Telehealth: Payer: Self-pay | Admitting: Gastroenterology

## 2018-05-04 ENCOUNTER — Other Ambulatory Visit: Payer: Self-pay

## 2018-05-04 DIAGNOSIS — R7989 Other specified abnormal findings of blood chemistry: Secondary | ICD-10-CM

## 2018-05-04 DIAGNOSIS — R945 Abnormal results of liver function studies: Principal | ICD-10-CM

## 2018-05-04 LAB — CBC WITH DIFFERENTIAL/PLATELET
Abs Immature Granulocytes: 0.01 10*3/uL (ref 0.00–0.07)
Basophils Absolute: 0 10*3/uL (ref 0.0–0.1)
Basophils Relative: 1 %
EOS ABS: 0.2 10*3/uL (ref 0.0–0.5)
Eosinophils Relative: 2 %
HCT: 38.8 % (ref 36.0–46.0)
Hemoglobin: 12.1 g/dL (ref 12.0–15.0)
Immature Granulocytes: 0 %
Lymphocytes Relative: 35 %
Lymphs Abs: 2.4 10*3/uL (ref 0.7–4.0)
MCH: 28.4 pg (ref 26.0–34.0)
MCHC: 31.2 g/dL (ref 30.0–36.0)
MCV: 91.1 fL (ref 80.0–100.0)
Monocytes Absolute: 0.6 10*3/uL (ref 0.1–1.0)
Monocytes Relative: 8 %
Neutro Abs: 3.7 10*3/uL (ref 1.7–7.7)
Neutrophils Relative %: 54 %
Platelets: 221 10*3/uL (ref 150–400)
RBC: 4.26 MIL/uL (ref 3.87–5.11)
RDW: 13.5 % (ref 11.5–15.5)
WBC: 6.8 10*3/uL (ref 4.0–10.5)
nRBC: 0 % (ref 0.0–0.2)

## 2018-05-04 LAB — GLUCOSE, CAPILLARY
GLUCOSE-CAPILLARY: 152 mg/dL — AB (ref 70–99)
Glucose-Capillary: 136 mg/dL — ABNORMAL HIGH (ref 70–99)
Glucose-Capillary: 167 mg/dL — ABNORMAL HIGH (ref 70–99)
Glucose-Capillary: 192 mg/dL — ABNORMAL HIGH (ref 70–99)

## 2018-05-04 LAB — BASIC METABOLIC PANEL
Anion gap: 11 (ref 5–15)
BUN: 9 mg/dL (ref 8–23)
CO2: 21 mmol/L — ABNORMAL LOW (ref 22–32)
Calcium: 8.7 mg/dL — ABNORMAL LOW (ref 8.9–10.3)
Chloride: 105 mmol/L (ref 98–111)
Creatinine, Ser: 0.59 mg/dL (ref 0.44–1.00)
GFR calc non Af Amer: >60 mL/min (ref >60)
Glucose, Bld: 168 mg/dL — ABNORMAL HIGH (ref 70–99)
Potassium: 4 mmol/L (ref 3.5–5.1)
Sodium: 137 mmol/L (ref 135–145)

## 2018-05-04 LAB — HEPATIC FUNCTION PANEL
ALT: 236 U/L — ABNORMAL HIGH (ref 0–44)
AST: 68 U/L — ABNORMAL HIGH (ref 15–41)
Albumin: 3.1 g/dL — ABNORMAL LOW (ref 3.5–5.0)
Alkaline Phosphatase: 224 U/L — ABNORMAL HIGH (ref 38–126)
Bilirubin, Direct: 0.3 mg/dL — ABNORMAL HIGH (ref 0.0–0.2)
Indirect Bilirubin: 1.9 mg/dL — ABNORMAL HIGH (ref 0.3–0.9)
TOTAL PROTEIN: 6.9 g/dL (ref 6.5–8.1)
Total Bilirubin: 2.2 mg/dL — ABNORMAL HIGH (ref 0.3–1.2)

## 2018-05-04 LAB — URINE CULTURE: Culture: NO GROWTH

## 2018-05-04 MED ORDER — LEVOFLOXACIN 500 MG PO TABS: 500.0000 mg | ORAL_TABLET | Freq: Every day | ORAL | 0 refills | Status: AC

## 2018-05-04 NOTE — Telephone Encounter (Signed)
Lab orders placed. Lmom, waiting on a return call.

## 2018-05-04 NOTE — Progress Notes (Signed)
Subjective: She says she feels well.  No abdominal pain.  No nausea.  Studies thus far are consistent with perhaps having passed a stone from the common bile duct.  She wants to eat.  Objective: Vital signs in last 24 hours: Temp:  [98.1 F (36.7 C)-98.6 F (37 C)] 98.1 F (36.7 C) (01/07 0644) Pulse Rate:  [69-92] 69 (01/07 0644) Resp:  [11-18] 18 (01/07 0644) BP: (101-133)/(60-79) 108/70 (01/07 0644) SpO2:  [95 %-99 %] 99 % (01/07 0644) Weight change:  Last BM Date: 05/02/18  Intake/Output from previous day: 01/06 0701 - 01/07 0700 In: 2148.6 [P.O.:240; I.V.:1617.8; IV Piggyback:290.8] Out: -   PHYSICAL EXAM General appearance: alert, cooperative and no distress Resp: clear to auscultation bilaterally Cardio: regular rate and rhythm, S1, S2 normal, no murmur, click, rub or gallop GI: soft, non-tender; bowel sounds normal; no masses,  no organomegaly Extremities: extremities normal, atraumatic, no cyanosis or edema  Lab Results:  Results for orders placed or performed during the hospital encounter of 05/02/18 (from the past 48 hour(s))  Urinalysis, Routine w reflex microscopic     Status: Abnormal   Collection Time: 05/02/18  7:09 PM  Result Value Ref Range   Color, Urine YELLOW YELLOW   APPearance CLEAR CLEAR   Specific Gravity, Urine 1.005 1.005 - 1.030   pH 6.0 5.0 - 8.0   Glucose, UA NEGATIVE NEGATIVE mg/dL   Hgb urine dipstick MODERATE (A) NEGATIVE   Bilirubin Urine NEGATIVE NEGATIVE   Ketones, ur 5 (A) NEGATIVE mg/dL   Protein, ur NEGATIVE NEGATIVE mg/dL   Nitrite NEGATIVE NEGATIVE   Leukocytes, UA NEGATIVE NEGATIVE   RBC / HPF 0-5 0 - 5 RBC/hpf   WBC, UA 0-5 0 - 5 WBC/hpf   Bacteria, UA NONE SEEN NONE SEEN   Squamous Epithelial / LPF 0-5 0 - 5    Comment: Performed at [facility], [address] [place], [place] [address]  Urine culture     Status: None   Collection Time: 05/02/18  7:09 PM  Result Value Ref Range   Specimen Description      URINE, CLEAN  CATCH Performed at [facility], [address], [place], [place] [address]    Special Requests      NONE Performed at [facility], [address], [place], [place] [address]    Culture      NO GROWTH Performed at [facility], [place] [address], [place], [place] [address]    Report Status 05/04/2018 FINAL   Influenza panel by PCR (type A & B)     Status: None   Collection Time: 05/02/18  7:11 PM  Result Value Ref Range   Influenza A By PCR NEGATIVE NEGATIVE   Influenza B By PCR NEGATIVE NEGATIVE    Comment: (NOTE) The Xpert Xpress Flu assay is intended as an aid in the diagnosis of  influenza and should not be used as a sole basis for treatment.  This  assay is FDA approved for nasopharyngeal swab specimens only. Nasal  washings and aspirates are unacceptable for Xpert Xpress Flu testing. Performed at [facility], [address], [place], [place] [address]   Comprehensive metabolic panel     Status: Abnormal   Collection Time: 05/02/18  7:32 PM  Result Value Ref Range   Sodium 133 (L) 135 - 145 mmol/L   Potassium 3.5 3.5 - 5.1 mmol/L   Chloride 102 98 - 111 mmol/L   CO2 23 22 - 32 mmol/L  Glucose, Bld 212 (H) 70 - 99 mg/dL   BUN 13 8 - 23 mg/dL   Creatinine, Ser 0.58 0.44 - 1.00 mg/dL   Calcium 8.9 8.9 - 10.3 mg/dL   Total Protein 7.4 6.5 - 8.1 g/dL   Albumin 3.6 3.5 - 5.0 g/dL   AST 522 (H) 15 - 41 U/L   ALT 515 (H) 0 - 44 U/L   Alkaline Phosphatase 276 (H) 38 - 126 U/L   Total Bilirubin 3.1 (H) 0.3 - 1.2 mg/dL   GFR calc non Af Amer >60 >60 mL/min   GFR calc Af Amer >60 >60 mL/min   Anion gap 8 5 - 15    Comment: Performed at [facility], [address], [place], [place] [address]  CBC WITH DIFFERENTIAL     Status: None   Collection Time: 05/02/18  7:32 PM  Result Value Ref Range   WBC 9.3 4.0 - 10.5 K/uL   RBC 4.27 3.87 - 5.11 MIL/uL   Hemoglobin 12.5 12.0 - 15.0 g/dL   HCT 38.5 36.0 - 46.0 %   MCV 90.2 80.0 - 100.0 fL   MCH 29.3 26.0 - 34.0 pg   MCHC 32.5  30.0 - 36.0 g/dL   RDW 13.2 11.5 - 15.5 %   Platelets 229 150 - 400 K/uL   nRBC 0.0 0.0 - 0.2 %   Neutrophils Relative % 74 %   Neutro Abs 6.9 1.7 - 7.7 K/uL   Lymphocytes Relative 16 %   Lymphs Abs 1.4 0.7 - 4.0 K/uL   Monocytes Relative 10 %   Monocytes Absolute 0.9 0.1 - 1.0 K/uL   Eosinophils Relative 0 %   Eosinophils Absolute 0.0 0.0 - 0.5 K/uL   Basophils Relative 0 %   Basophils Absolute 0.0 0.0 - 0.1 K/uL   Immature Granulocytes 0 %   Abs Immature Granulocytes 0.03 0.00 - 0.07 K/uL    Comment: Performed at [facility], [address] [place],  [address]  Procalcitonin     Status: None   Collection Time: 05/02/18  7:32 PM  Result Value Ref Range   Procalcitonin 0.33 ng/mL    Comment:        Interpretation: PCT (Procalcitonin) <= 0.5 ng/mL: Systemic infection (sepsis) is not likely. Local bacterial infection is possible. (NOTE)       Sepsis PCT Algorithm           Lower Respiratory Tract                                      Infection PCT Algorithm    ----------------------------     ----------------------------         PCT < 0.25 ng/mL                PCT < 0.10 ng/mL         Strongly encourage             Strongly discourage   discontinuation of antibiotics    initiation of antibiotics    ----------------------------     -----------------------------       PCT 0.25 - 0.50 ng/mL            PCT 0.10 - 0.25 ng/mL               OR       >80% decrease in PCT  Discourage initiation of                                            antibiotics      Encourage discontinuation           of antibiotics    ----------------------------     -----------------------------         PCT >= 0.50 ng/mL              PCT 0.26 - 0.50 ng/mL               AND        <80% decrease in PCT             Encourage initiation of                                             antibiotics       Encourage continuation           of antibiotics    ----------------------------      -----------------------------        PCT >= 0.50 ng/mL                  PCT > 0.50 ng/mL               AND         increase in PCT                  Strongly encourage                                      initiation of antibiotics    Strongly encourage escalation           of antibiotics                                     -----------------------------                                           PCT <= 0.25 ng/mL                                                 OR                                        > 80% decrease in PCT                                     Discontinue / Do not initiate  antibiotics Performed at [facility], [address] [place]  [address]   Magnesium     Status: None   Collection Time: 05/02/18  7:32 PM  Result Value Ref Range   Magnesium 1.7 1.7 - 2.4 mg/dL    Comment: Performed at [facility], [address] [place], [place] [address]  Phosphorus     Status: Abnormal   Collection Time: 05/02/18  7:32 PM  Result Value Ref Range   Phosphorus 2.4 (L) 2.5 - 4.6 mg/dL    Comment: Performed at [facility], [address], [place], [place] [address]  Protime-INR     Status: None   Collection Time: 05/02/18  7:32 PM  Result Value Ref Range   Prothrombin Time 14.6 11.4 - 15.2 seconds   INR 1.15     Comment: Performed at [facility], [address] [place], [place] [address]  I-Stat CG4 Lactic Acid, ED     Status: None   Collection Time: 05/02/18  7:44 PM  Result Value Ref Range   Lactic Acid, Venous 0.96 0.5 - 1.9 mmol/L  CBC WITH DIFFERENTIAL     Status: Abnormal   Collection Time: 05/03/18  4:56 AM  Result Value Ref Range   WBC 7.4 4.0 - 10.5 K/uL   RBC 3.90 3.87 - 5.11 MIL/uL   Hemoglobin 11.3 (L) 12.0 - 15.0 g/dL   HCT 35.2 (L) 36.0 - 46.0 %   MCV 90.3 80.0 - 100.0 fL   MCH 29.0 26.0 - 34.0 pg   MCHC 32.1 30.0 - 36.0 g/dL   RDW 13.3 11.5 - 15.5 %   Platelets 190 150 - 400 K/uL   nRBC 0.0 0.0 - 0.2 %    Neutrophils Relative % 68 %   Neutro Abs 5.0 1.7 - 7.7 K/uL   Lymphocytes Relative 20 %   Lymphs Abs 1.4 0.7 - 4.0 K/uL   Monocytes Relative 11 %   Monocytes Absolute 0.8 0.1 - 1.0 K/uL   Eosinophils Relative 1 %   Eosinophils Absolute 0.1 0.0 - 0.5 K/uL   Basophils Relative 0 %   Basophils Absolute 0.0 0.0 - 0.1 K/uL   Immature Granulocytes 0 %   Abs Immature Granulocytes 0.02 0.00 - 0.07 K/uL    Comment: Performed at [facility], [address] [place], [place] [address]  Protime-INR     Status: Abnormal   Collection Time: 05/03/18  4:56 AM  Result Value Ref Range   Prothrombin Time 15.4 (H) 11.4 - 15.2 seconds   INR 1.23     Comment: Performed at [facility], [address] [place], [place] [address]  Hepatic function panel     Status: Abnormal   Collection Time: 05/03/18  4:56 AM  Result Value Ref Range   Total Protein 6.5 6.5 - 8.1 g/dL   Albumin 3.1 (L) 3.5 - 5.0 g/dL   AST 281 (H) 15 - 41 U/L   ALT 362 (H) 0 - 44 U/L   Alkaline Phosphatase 248 (H) 38 - 126 U/L   Total Bilirubin 3.8 (H) 0.3 - 1.2 mg/dL   Bilirubin, Direct 1.0 (H) 0.0 - 0.2 mg/dL   Indirect Bilirubin 2.8 (H) 0.3 - 0.9 mg/dL    Comment: Performed at [facility], [address], [place],  [address]  Basic metabolic panel     Status: Abnormal   Collection Time: 05/03/18  4:56 AM  Result Value Ref Range   Sodium 136 135 - 145 mmol/L   Potassium 3.7  3.5 - 5.1 mmol/L   Chloride 107 98 - 111 mmol/L   CO2 22 22 - 32 mmol/L   Glucose, Bld 207 (H) 70 - 99 mg/dL   BUN 8 8 - 23 mg/dL   Creatinine, Ser 0.51 0.44 - 1.00 mg/dL   Calcium 8.4 (L) 8.9 - 10.3 mg/dL   GFR calc non Af Amer >60 >60 mL/min   GFR calc Af Amer >60 >60 mL/min   Anion gap 7 5 - 15    Comment: Performed at [facility], [address], [place], [place] [address]  CBG monitoring, ED     Status: Abnormal   Collection Time: 05/03/18  8:35 AM  Result Value Ref Range   Glucose-Capillary 191 (H) 70 - 99 mg/dL  CBG monitoring, ED      Status: Abnormal   Collection Time: 05/03/18 12:34 PM  Result Value Ref Range   Glucose-Capillary 189 (H) 70 - 99 mg/dL  Glucose, capillary     Status: Abnormal   Collection Time: 05/03/18  4:18 PM  Result Value Ref Range   Glucose-Capillary 134 (H) 70 - 99 mg/dL  Glucose, capillary     Status: Abnormal   Collection Time: 05/03/18  9:35 PM  Result Value Ref Range   Glucose-Capillary 181 (H) 70 - 99 mg/dL  Glucose, capillary     Status: Abnormal   Collection Time: 05/04/18  1:16 AM  Result Value Ref Range   Glucose-Capillary 136 (H) 70 - 99 mg/dL  CBC WITH DIFFERENTIAL     Status: None   Collection Time: 05/04/18  6:03 AM  Result Value Ref Range   WBC 6.8 4.0 - 10.5 K/uL   RBC 4.26 3.87 - 5.11 MIL/uL   Hemoglobin 12.1 12.0 - 15.0 g/dL   HCT 38.8 36.0 - 46.0 %   MCV 91.1 80.0 - 100.0 fL   MCH 28.4 26.0 - 34.0 pg   MCHC 31.2 30.0 - 36.0 g/dL   RDW 13.5 11.5 - 15.5 %   Platelets 221 150 - 400 K/uL   nRBC 0.0 0.0 - 0.2 %   Neutrophils Relative % 54 %   Neutro Abs 3.7 1.7 - 7.7 K/uL   Lymphocytes Relative 35 %   Lymphs Abs 2.4 0.7 - 4.0 K/uL   Monocytes Relative 8 %   Monocytes Absolute 0.6 0.1 - 1.0 K/uL   Eosinophils Relative 2 %   Eosinophils Absolute 0.2 0.0 - 0.5 K/uL   Basophils Relative 1 %   Basophils Absolute 0.0 0.0 - 0.1 K/uL   Immature Granulocytes 0 %   Abs Immature Granulocytes 0.01 0.00 - 0.07 K/uL    Comment: Performed at [facility], [address], [place], [place] [address]  Hepatic function panel     Status: Abnormal   Collection Time: 05/04/18  6:03 AM  Result Value Ref Range   Total Protein 6.9 6.5 - 8.1 g/dL   Albumin 3.1 (L) 3.5 - 5.0 g/dL   AST 68 (H) 15 - 41 U/L   ALT 236 (H) 0 - 44 U/L   Alkaline Phosphatase 224 (H) 38 - 126 U/L   Total Bilirubin 2.2 (H) 0.3 - 1.2 mg/dL   Bilirubin, Direct 0.3 (H) 0.0 - 0.2 mg/dL   Indirect Bilirubin 1.9 (H) 0.3 - 0.9 mg/dL    Comment: Performed at [facility], [address] [place], [place] [address]   Basic metabolic panel     Status: Abnormal   Collection Time: 05/04/18  6:03 AM  Result  Value Ref Range   Sodium 137 135 - 145 mmol/L   Potassium 4.0 3.5 - 5.1 mmol/L   Chloride 105 98 - 111 mmol/L   CO2 21 (L) 22 - 32 mmol/L   Glucose, Bld 168 (H) 70 - 99 mg/dL   BUN 9 8 - 23 mg/dL   Creatinine, Ser 0.59 0.44 - 1.00 mg/dL   Calcium 8.7 (L) 8.9 - 10.3 mg/dL   GFR calc non Af Amer >60 >60 mL/min   GFR calc Af Amer >60 >60 mL/min   Anion gap 11 5 - 15    Comment: Performed at [facility], [address] [place], [place] [address]  Glucose, capillary     Status: Abnormal   Collection Time: 05/04/18  6:49 AM  Result Value Ref Range   Glucose-Capillary 152 (H) 70 - 99 mg/dL    ABGS No results for input(s): PHART, PO2ART, TCO2, HCO3 in the last 72 hours.  Invalid input(s): PCO2 CULTURES Recent Results (from the past 240 hour(s))  Urine culture     Status: None   Collection Time: 05/02/18  7:09 PM  Result Value Ref Range Status   Specimen Description   Final    URINE, CLEAN CATCH Performed at [facility], [address], [place], [place] [address]    Special Requests   Final    NONE Performed at [facility], [address] [place], [place] [address]    Culture   Final    NO GROWTH Performed at [facility], [place] [address] [place], [place] [address]    Report Status 05/04/2018 FINAL  Final   Studies/Results: Dg Chest 2 View  Result Date: 05/02/2018 CLINICAL DATA:  Pt reports fever last night and today. Denies other symptoms. Denies urinary symptoms/denies SOB/ denies body aches. Only reports decreased appetite. Reports fever around 5pm today was 102.4HISTORY OF CANCER, ASTHMA, DM EXAM: CHEST - 2 VIEW COMPARISON:  01/04/2018 FINDINGS: Cardiac silhouette is normal in size. Normal mediastinal and hilar contours. Clear lungs.  No pleural effusion or pneumothorax. Skeletal structures are intact. IMPRESSION: No active cardiopulmonary disease. Electronically Signed   By:  Lajean Manes M.D.   On: 05/02/2018 20:37   Ct Abdomen Pelvis W Contrast  Result Date: 05/02/2018 CLINICAL DATA:  Elevated bilirubin and LFTs,, abdominal pain, prior Whipple procedure, type II diabetes mellitus, GERD, breast cancer post LEFT lumpectomy EXAM: CT ABDOMEN AND PELVIS WITH CONTRAST TECHNIQUE: Multidetector CT imaging of the abdomen and pelvis was performed using the standard protocol following bolus administration of intravenous contrast. Sagittal and coronal MPR images reconstructed from axial data set. CONTRAST:  139mL ISOVUE-300 IOPAMIDOL (ISOVUE-300) INJECTION 61% IV. No oral contrast. COMPARISON:  01/10/2017 FINDINGS: Lower chest: Lung bases clear Hepatobiliary: Gallbladder surgically absent. Cyst lateral segment LEFT lobe liver 14 x 13 mm decreased from the 29 x 21 mm on the previous exam. No other focal hepatic abnormalities. The proximal CBD is mildly prominent at 10 mm diameter though only minimal central intrahepatic biliary dilatation is seen. Pancreas: Prior resection of the pancreatic head and portion of body post Whipple. Atrophy of the remaining pancreatic body and tail with ductal dilatation similar to previous exam. No definite pancreatic mass. Spleen: Normal appearance Adrenals/Urinary Tract: Diffuse thickening of the LEFT adrenal gland without discrete mass. Tiny cyst RIGHT kidney. Adrenal glands, kidneys, ureters and bladder otherwise normal appearance. Stomach/Bowel: Normal appendix. Prior antrectomy with residual stomach demonstrating prominent wall though this may be an artifact related to underdistention. Scattered stool throughout  colon. Minimal distal colonic diverticulosis. Unopacified small bowel loops. Soft tissue at the porta hepatis and adjacent to the LEFT lobe of the liver likely represents unopacified small bowel and choledochojejunostomy. Vascular/Lymphatic: Atherosclerotic calcifications aorta and iliac arteries without aneurysm. Few scattered normal size mesenteric  lymph nodes in RIGHT mid abdomen. Few normal sized lymph nodes are also seen anterior to the IVC in the upper abdomen and LEFT para-aortic. No adenopathy. Reproductive: Unremarkable uterus and adnexa Other: No free air or free fluid. No hernia or acute inflammatory process. Musculoskeletal: No acute osseous findings. Bones demineralized. Again seen potential vertebral hemangioma at T12. IMPRESSION: Post Whipple procedure with mild dilatation of the CBD 10 mm diameter associated with minimal intrahepatic biliary dilatation; recommend correlation with LFTs. No other definite intra-abdominal or intrapelvic abnormalities. Electronically Signed   By: Lavonia Dana M.D.   On: 05/02/2018 22:30   Mr 3d Recon At Scanner  Result Date: 05/03/2018 CLINICAL DATA:  Fever, abnormal LFTs, dilated common duct status post Whipple EXAM: MRI ABDOMEN WITHOUT AND WITH CONTRAST (INCLUDING MRCP) TECHNIQUE: Multiplanar multisequence MR imaging of the abdomen was performed both before and after the administration of intravenous contrast. Heavily T2-weighted images of the biliary and pancreatic ducts were obtained, and three-dimensional MRCP images were rendered by post processing. CONTRAST:  5 mL Gadovist IV COMPARISON:  CT abdomen/pelvis dated 05/02/2018 FINDINGS: Lower chest: Lung bases are clear. Hepatobiliary: 12 mm cyst in segment 2 (series 23/image 20). No suspicious enhancing hepatic lesions. No hepatic steatosis. Very mild intrahepatic ductal dilatation in the central left hepatic lobe. Status post cholecystectomy with choledochojejunostomy. Common duct measures 7 mm. No choledocholithiasis is seen. Pancreas: Status post resection of the pancreatic head/uncinate process with pancreaticojejunostomy. Atrophy of the pancreatic body/tail with associated irregular dilatation of the pancreatic duct, most prominent in the proximal pancreatic body (series 23/image 20), chronic. Spleen:  Within normal limits. Adrenals/Urinary Tract:  Adrenal  glands are within normal limits. 1.9 cm left lower pole renal cyst. Right kidney is within normal limits. No hydronephrosis. Stomach/Bowel: Status post distal gastrectomy with gastrojejunostomy. Visualized bowel is otherwise unremarkable Vascular/Lymphatic:  No evidence of abdominal aortic aneurysm. No suspicious abdominal lymphadenopathy. Other:  No abdominal ascites. Musculoskeletal: No focal osseous lesions. IMPRESSION: Postsurgical changes related to Whipple procedure, as above. Common duct measures 7 mm, within normal limits. Minimal intrahepatic ductal dilatation in the central left hepatic lobe. No choledocholithiasis is seen. 12 mm cyst in segment 2 of the liver. No suspicious hepatic lesions. Irregular ductal dilatation of the pancreatic duct with atrophy of the pancreatic body/tail in this patient status post partial pancreatectomy with pancreaticojejunostomy, chronic. Electronically Signed   By: Julian Hy M.D.   On: 05/03/2018 12:25   Mr Abdomen Mrcp Moise Boring Contast  Result Date: 05/03/2018 CLINICAL DATA:  Fever, abnormal LFTs, dilated common duct status post Whipple EXAM: MRI ABDOMEN WITHOUT AND WITH CONTRAST (INCLUDING MRCP) TECHNIQUE: Multiplanar multisequence MR imaging of the abdomen was performed both before and after the administration of intravenous contrast. Heavily T2-weighted images of the biliary and pancreatic ducts were obtained, and three-dimensional MRCP images were rendered by post processing. CONTRAST:  5 mL Gadovist IV COMPARISON:  CT abdomen/pelvis dated 05/02/2018 FINDINGS: Lower chest: Lung bases are clear. Hepatobiliary: 12 mm cyst in segment 2 (series 23/image 20). No suspicious enhancing hepatic lesions. No hepatic steatosis. Very mild intrahepatic ductal dilatation in the central left hepatic lobe. Status post cholecystectomy with choledochojejunostomy. Common duct measures 7 mm. No choledocholithiasis is seen. Pancreas: Status post resection  of the pancreatic  head/uncinate process with pancreaticojejunostomy. Atrophy of the pancreatic body/tail with associated irregular dilatation of the pancreatic duct, most prominent in the proximal pancreatic body (series 23/image 20), chronic. Spleen:  Within normal limits. Adrenals/Urinary Tract:  Adrenal glands are within normal limits. 1.9 cm left lower pole renal cyst. Right kidney is within normal limits. No hydronephrosis. Stomach/Bowel: Status post distal gastrectomy with gastrojejunostomy. Visualized bowel is otherwise unremarkable Vascular/Lymphatic:  No evidence of abdominal aortic aneurysm. No suspicious abdominal lymphadenopathy. Other:  No abdominal ascites. Musculoskeletal: No focal osseous lesions. IMPRESSION: Postsurgical changes related to Whipple procedure, as above. Common duct measures 7 mm, within normal limits. Minimal intrahepatic ductal dilatation in the central left hepatic lobe. No choledocholithiasis is seen. 12 mm cyst in segment 2 of the liver. No suspicious hepatic lesions. Irregular ductal dilatation of the pancreatic duct with atrophy of the pancreatic body/tail in this patient status post partial pancreatectomy with pancreaticojejunostomy, chronic. Electronically Signed   By: Julian Hy M.D.   On: 05/03/2018 12:25    Medications:  Prior to Admission:  Medications Prior to Admission  Medication Sig Dispense Refill Last Dose  . albuterol (PROVENTIL HFA;VENTOLIN HFA) 108 (90 BASE) MCG/ACT inhaler Inhale 2 puffs into the lungs every 6 (six) hours as needed for wheezing.   05/02/2018 at Unknown time  . dicyclomine (BENTYL) 10 MG capsule Take 1 tablet once or twice daily as needed for abdominal pain and gas. (Patient taking differently: Take 10 mg by mouth 2 (two) times daily as needed (FOR ABDOMINAL PAIN/GAS). ) 30 capsule 3 unknown  . fluticasone furoate-vilanterol (BREO ELLIPTA) 200-25 MCG/INH AEPB Inhale 1 puff into the lungs daily.   05/01/2018 at Unknown time  . HUMALOG KWIKPEN 100  UNIT/ML KiwkPen INJECT 4 TO 10 UNITS INTO THE SKIN 3 TIMES DAILY WITH MEALS. (Patient taking differently: Inject 4-5 Units into the skin 3 (three) times daily before meals. DEPENDING ON BLOOD SUGAR LEVELS. TAKES AS NEEDED) 9 mL 2 05/02/2018 at Unknown time  . Insulin Glargine (TOUJEO MAX SOLOSTAR) 300 UNIT/ML SOPN Inject 12 Units into the skin at bedtime. (Patient taking differently: Inject 9-12 Units into the skin at bedtime as needed (FOR HIGH BLOOD SUGAR LEVELS OVER 230). ) 3 pen 2 Past Week at Unknown time  . omeprazole (PRILOSEC) 20 MG capsule Take 1 capsule (20 mg total) by mouth daily. 14 capsule 0 05/02/2018 at Unknown time  . Pancrelipase, Lip-Prot-Amyl, (CREON) 24000-76000 units CPEP TAKE 1 CAPSULE WITH MEALS AND SNACKS UP TO 6 A DAY (Patient taking differently: Take 1 capsule by mouth 3 (three) times daily with meals. TAKE 1 CAPSULE WITH MEALS AND SNACKS UP TO 6 A DAY) 180 capsule 2 05/01/2018 at Unknown time  . valACYclovir (VALTREX) 500 MG tablet Take 500 mg by mouth daily as needed (outbreak).    2 MONTHS   Scheduled: . fluticasone furoate-vilanterol  1 puff Inhalation Daily  . insulin aspart  0-9 Units Subcutaneous TID WC  . lipase/protease/amylase  36,000 Units Oral TID WC   Continuous: . 0.9 % NaCl with KCl 20 mEq / L 100 mL/hr at 05/04/18 0643  . famotidine (PEPCID) IV 20 mg (05/04/18 0550)  . meropenem (MERREM) IV Stopped (05/03/18 2100)   QMV:HQIONGEXBMW **OR** ondansetron (ZOFRAN) IV  Assesment: She was admitted with possibly acute cholangitis.  She has received IV antibiotics.  She did not have abdominal pain but did have chills.  She feels well now.  She has had a Whipple procedure in the past.  She has diabetes which has been worse since the removal of part of her pancreas.  She has asthma which is stable  She has anxiety which is stable Principal Problem:   Acute cholangitis Active Problems:   Gastroesophageal reflux   Asthma   Diabetes mellitus associated with  pancreatic disease (HCC)   Depression   Hyperlipidemia   Hyponatremia   Cholangitis    Plan: Advance her diet.  This had been planned for yesterday but apparently did not happen.  She has clear liquids this morning but I will see if I can get her a diabetic diet this morning.  If she can eat breakfast and lunch without trouble then she could potentially be discharged home.  I will need to discuss with GI about whether she needs antibiotics at home and what would be an appropriate choice.    LOS: 1 day   Alonza Bogus 05/04/2018, 8:19 AM

## 2018-05-04 NOTE — Progress Notes (Addendum)
Subjective: No abdominal pain, fever, chills, N/V. Good appetite.   Objective: Vital signs in last 24 hours: Temp:  [98.1 F (36.7 C)-98.6 F (37 C)] 98.1 F (36.7 C) (01/07 0644) Pulse Rate:  [69-92] 69 (01/07 0644) Resp:  [11-18] 18 (01/07 0644) BP: (101-133)/(60-79) 108/70 (01/07 0644) SpO2:  [95 %-99 %] 99 % (01/07 0644) Last BM Date: 05/02/18 General:   Alert and oriented, pleasant Abdomen:  Bowel sounds present, soft, non-tender, non-distended. Extremities:  Without edema. Neurologic:  Alert and  oriented x4   Intake/Output from previous day: 01/06 0701 - 01/07 0700 In: 2148.6 [P.O.:240; I.V.:1617.8; IV Piggyback:290.8] Out: -  Intake/Output this shift: No intake/output data recorded.  Lab Results: Recent Labs    05/02/18 1932 05/03/18 0456 05/04/18 0603  WBC 9.3 7.4 6.8  HGB 12.5 11.3* 12.1  HCT 38.5 35.2* 38.8  PLT 229 190 221   BMET Recent Labs    05/02/18 1932 05/03/18 0456 05/04/18 0603  NA 133* 136 137  K 3.5 3.7 4.0  CL 102 107 105  CO2 23 22 21*  GLUCOSE 212* 207* 168*  BUN 13 8 9   CREATININE 0.58 0.51 0.59  CALCIUM 8.9 8.4* 8.7*   LFT Recent Labs    05/02/18 1932 05/03/18 0456 05/04/18 0603  PROT 7.4 6.5 6.9  ALBUMIN 3.6 3.1* 3.1*  AST 522* 281* 68*  ALT 515* 362* 236*  ALKPHOS 276* 248* 224*  BILITOT 3.1* 3.8* 2.2*  BILIDIR  --  1.0* 0.3*  IBILI  --  2.8* 1.9*   PT/INR Recent Labs    05/02/18 1932 05/03/18 0456  LABPROT 14.6 15.4*  INR 1.15 1.23     Studies/Results: Dg Chest 2 View  Result Date: 05/02/2018 CLINICAL DATA:  Pt reports fever last night and today. Denies other symptoms. Denies urinary symptoms/denies SOB/ denies body aches. Only reports decreased appetite. Reports fever around 5pm today was 102.4HISTORY OF CANCER, ASTHMA, DM EXAM: CHEST - 2 VIEW COMPARISON:  01/04/2018 FINDINGS: Cardiac silhouette is normal in size. Normal mediastinal and hilar contours. Clear lungs.  No pleural effusion or  pneumothorax. Skeletal structures are intact. IMPRESSION: No active cardiopulmonary disease. Electronically Signed   By: Lajean Manes M.D.   On: 05/02/2018 20:37   Ct Abdomen Pelvis W Contrast  Result Date: 05/02/2018 CLINICAL DATA:  Elevated bilirubin and LFTs,, abdominal pain, prior Whipple procedure, type II diabetes mellitus, GERD, breast cancer post LEFT lumpectomy EXAM: CT ABDOMEN AND PELVIS WITH CONTRAST TECHNIQUE: Multidetector CT imaging of the abdomen and pelvis was performed using the standard protocol following bolus administration of intravenous contrast. Sagittal and coronal MPR images reconstructed from axial data set. CONTRAST:  115mL ISOVUE-300 IOPAMIDOL (ISOVUE-300) INJECTION 61% IV. No oral contrast. COMPARISON:  01/10/2017 FINDINGS: Lower chest: Lung bases clear Hepatobiliary: Gallbladder surgically absent. Cyst lateral segment LEFT lobe liver 14 x 13 mm decreased from the 29 x 21 mm on the previous exam. No other focal hepatic abnormalities. The proximal CBD is mildly prominent at 10 mm diameter though only minimal central intrahepatic biliary dilatation is seen. Pancreas: Prior resection of the pancreatic head and portion of body post Whipple. Atrophy of the remaining pancreatic body and tail with ductal dilatation similar to previous exam. No definite pancreatic mass. Spleen: Normal appearance Adrenals/Urinary Tract: Diffuse thickening of the LEFT adrenal gland without discrete mass. Tiny cyst RIGHT kidney. Adrenal glands, kidneys, ureters and bladder otherwise normal appearance. Stomach/Bowel: Normal appendix. Prior antrectomy with residual stomach demonstrating prominent wall though this may  be an artifact related to underdistention. Scattered stool throughout colon. Minimal distal colonic diverticulosis. Unopacified small bowel loops. Soft tissue at the porta hepatis and adjacent to the LEFT lobe of the liver likely represents unopacified small bowel and choledochojejunostomy.  Vascular/Lymphatic: Atherosclerotic calcifications aorta and iliac arteries without aneurysm. Few scattered normal size mesenteric lymph nodes in RIGHT mid abdomen. Few normal sized lymph nodes are also seen anterior to the IVC in the upper abdomen and LEFT para-aortic. No adenopathy. Reproductive: Unremarkable uterus and adnexa Other: No free air or free fluid. No hernia or acute inflammatory process. Musculoskeletal: No acute osseous findings. Bones demineralized. Again seen potential vertebral hemangioma at T12. IMPRESSION: Post Whipple procedure with mild dilatation of the CBD 10 mm diameter associated with minimal intrahepatic biliary dilatation; recommend correlation with LFTs. No other definite intra-abdominal or intrapelvic abnormalities. Electronically Signed   By: Lavonia Dana M.D.   On: 05/02/2018 22:30   Mr 3d Recon At Scanner  Result Date: 05/03/2018 CLINICAL DATA:  Fever, abnormal LFTs, dilated common duct status post Whipple EXAM: MRI ABDOMEN WITHOUT AND WITH CONTRAST (INCLUDING MRCP) TECHNIQUE: Multiplanar multisequence MR imaging of the abdomen was performed both before and after the administration of intravenous contrast. Heavily T2-weighted images of the biliary and pancreatic ducts were obtained, and three-dimensional MRCP images were rendered by post processing. CONTRAST:  5 mL Gadovist IV COMPARISON:  CT abdomen/pelvis dated 05/02/2018 FINDINGS: Lower chest: Lung bases are clear. Hepatobiliary: 12 mm cyst in segment 2 (series 23/image 20). No suspicious enhancing hepatic lesions. No hepatic steatosis. Very mild intrahepatic ductal dilatation in the central left hepatic lobe. Status post cholecystectomy with choledochojejunostomy. Common duct measures 7 mm. No choledocholithiasis is seen. Pancreas: Status post resection of the pancreatic head/uncinate process with pancreaticojejunostomy. Atrophy of the pancreatic body/tail with associated irregular dilatation of the pancreatic duct, most  prominent in the proximal pancreatic body (series 23/image 20), chronic. Spleen:  Within normal limits. Adrenals/Urinary Tract:  Adrenal glands are within normal limits. 1.9 cm left lower pole renal cyst. Right kidney is within normal limits. No hydronephrosis. Stomach/Bowel: Status post distal gastrectomy with gastrojejunostomy. Visualized bowel is otherwise unremarkable Vascular/Lymphatic:  No evidence of abdominal aortic aneurysm. No suspicious abdominal lymphadenopathy. Other:  No abdominal ascites. Musculoskeletal: No focal osseous lesions. IMPRESSION: Postsurgical changes related to Whipple procedure, as above. Common duct measures 7 mm, within normal limits. Minimal intrahepatic ductal dilatation in the central left hepatic lobe. No choledocholithiasis is seen. 12 mm cyst in segment 2 of the liver. No suspicious hepatic lesions. Irregular ductal dilatation of the pancreatic duct with atrophy of the pancreatic body/tail in this patient status post partial pancreatectomy with pancreaticojejunostomy, chronic. Electronically Signed   By: Julian Hy M.D.   On: 05/03/2018 12:25   Mr Abdomen Mrcp Moise Boring Contast  Result Date: 05/03/2018 CLINICAL DATA:  Fever, abnormal LFTs, dilated common duct status post Whipple EXAM: MRI ABDOMEN WITHOUT AND WITH CONTRAST (INCLUDING MRCP) TECHNIQUE: Multiplanar multisequence MR imaging of the abdomen was performed both before and after the administration of intravenous contrast. Heavily T2-weighted images of the biliary and pancreatic ducts were obtained, and three-dimensional MRCP images were rendered by post processing. CONTRAST:  5 mL Gadovist IV COMPARISON:  CT abdomen/pelvis dated 05/02/2018 FINDINGS: Lower chest: Lung bases are clear. Hepatobiliary: 12 mm cyst in segment 2 (series 23/image 20). No suspicious enhancing hepatic lesions. No hepatic steatosis. Very mild intrahepatic ductal dilatation in the central left hepatic lobe. Status post cholecystectomy with  choledochojejunostomy. Common duct measures  7 mm. No choledocholithiasis is seen. Pancreas: Status post resection of the pancreatic head/uncinate process with pancreaticojejunostomy. Atrophy of the pancreatic body/tail with associated irregular dilatation of the pancreatic duct, most prominent in the proximal pancreatic body (series 23/image 20), chronic. Spleen:  Within normal limits. Adrenals/Urinary Tract:  Adrenal glands are within normal limits. 1.9 cm left lower pole renal cyst. Right kidney is within normal limits. No hydronephrosis. Stomach/Bowel: Status post distal gastrectomy with gastrojejunostomy. Visualized bowel is otherwise unremarkable Vascular/Lymphatic:  No evidence of abdominal aortic aneurysm. No suspicious abdominal lymphadenopathy. Other:  No abdominal ascites. Musculoskeletal: No focal osseous lesions. IMPRESSION: Postsurgical changes related to Whipple procedure, as above. Common duct measures 7 mm, within normal limits. Minimal intrahepatic ductal dilatation in the central left hepatic lobe. No choledocholithiasis is seen. 12 mm cyst in segment 2 of the liver. No suspicious hepatic lesions. Irregular ductal dilatation of the pancreatic duct with atrophy of the pancreatic body/tail in this patient status post partial pancreatectomy with pancreaticojejunostomy, chronic. Electronically Signed   By: Julian Hy M.D.   On: 05/03/2018 12:25    Assessment: Pleasant 78 year old female with history of multiple pancreatic cysts and dominant head of pancreas cyst in the past, s/p pancreaticoduodenectomy Sept 2016 by Dr. 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 Presenting with fever/chills, elevated LFTs, most likely due to partial biliary obstruction that is improving. LFTs continue to improve, and she remains asymptomatic currently. Will likely go home today or next 24 hours. Agree with diet advancement. Discussed with patient that she  will need follow-up with Dr. Ardis Hughs as outpatient, which we will help facilitate.  .   Plan: Diet advanced Outpatient follow-up with Dr. Ardis Hughs Anticipate discharge today or next 24 hours   Annitta Needs, PhD, ANP-BC [facility]    LOS: 1 day    05/04/2018, 9:47 AM  Attending note: Patient clinically improved.  Discussed with Dr. Luan Pulling.  Agree with oral antibiotics x5 more days then discontinue.  Close interval follow-up appointment with Dr. Ardis Hughs to be arranged. Would also recommend a repeat hepatic function in 1 week.

## 2018-05-04 NOTE — Telephone Encounter (Signed)
Appt made with Dr Ardis Hughs for [birth year] at 167 am.  Betty Nguyen if you speak with the pt will you please give her the appt information.  Thank you

## 2018-05-04 NOTE — Telephone Encounter (Signed)
[facility] clinical pool:  Please help facilitate hospital follow-up with Dr. Ardis Hughs, known to him previously. She has a history of multiple pancreatic cysts and dominant head of pancreas cyst in the past, s/p pancreaticoduodenectomy Sept 2016 by Dr. 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 This admission with elevated LFTs and felt to have had partial biliary obstruction now resolving. LFTs improving. Will empirically on antibiotics for an additional 5 days.   Alicia: please have her repeat an HFP in 1 week so we can this is improving.

## 2018-05-04 NOTE — Telephone Encounter (Signed)
Thanks,  Betty Nguyen, she needs rov with me or extender in the next 3-4 weeks. Will need LFTs a couple days prior to that appt.  thanks

## 2018-05-04 NOTE — Telephone Encounter (Signed)
Noted. Will give pt appointment info.

## 2018-05-05 ENCOUNTER — Telehealth: Payer: Self-pay | Admitting: Internal Medicine

## 2018-05-05 ENCOUNTER — Other Ambulatory Visit: Payer: Self-pay

## 2018-05-05 DIAGNOSIS — R7989 Other specified abnormal findings of blood chemistry: Secondary | ICD-10-CM

## 2018-05-05 DIAGNOSIS — R945 Abnormal results of liver function studies: Principal | ICD-10-CM

## 2018-05-05 NOTE — Telephone Encounter (Signed)
[phone]  PATIENT RETURNED CALL. PLEASE CALL BACK

## 2018-05-05 NOTE — Telephone Encounter (Signed)
Spoke with pt. Orders placed. Pt is aware of her next apt and will have lab work done next week as directed.

## 2018-05-05 NOTE — Progress Notes (Signed)
Patient's IV catheter removed and intact. Discharge instructions including medications, follow up appointments were reviewed and discussed with patient. All questions were answered and no further questions at this time. Pt in stable condition and in no acute distress at time of discharge. Pt escorted by nurse tech.

## 2018-05-07 ENCOUNTER — Ambulatory Visit: Payer: Medicare Other | Admitting: "Endocrinology

## 2018-05-07 NOTE — Discharge Summary (Signed)
Physician Discharge Summary  Patient ID: Betty Nguyen MRN: [id number] DOB/AGE: 09/23/1940 78 y.o. Primary Care Physician:Aziz Slape, Percell Miller, MD Admit date: 05/02/2018 Discharge date: 05/07/2018    Discharge Diagnoses:   Principal Problem:   Acute cholangitis Active Problems:   Gastroesophageal reflux   Asthma   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[place])   Depression   Hyperlipidemia   Hyponatremia   Cholangitis Exocrine pancreatic insufficiency  Allergies as of 05/04/2018      Reactions   Biaxin [clarithromycin] Other (See Comments)   Hallucinations   Carafate [sucralfate] Other (See Comments)   Flared up her vertigo, run her blood sugar up and caused constipation   Codeine Other (See Comments)   Hallucinations    Doxycycline Other (See Comments)   GI upset , turned stool orange   Meat [alpha-gal]    RED MEAT    Metronidazole Other (See Comments)   Heart Palpations -flagyl   Cephalosporins Hives, Palpitations      Medication List    TAKE these medications   albuterol 108 (90 Base) MCG/ACT inhaler Commonly known as:  PROVENTIL HFA;VENTOLIN HFA Inhale 2 puffs into the lungs every 6 (six) hours as needed for wheezing.   BREO ELLIPTA 200-25 MCG/INH Aepb Generic drug:  fluticasone furoate-vilanterol Inhale 1 puff into the lungs daily.   dicyclomine 10 MG capsule Commonly known as:  BENTYL Take 1 tablet once or twice daily as needed for abdominal pain and gas. What changed:    how much to take  how to take this  when to take this  reasons to take this  additional instructions   HUMALOG KWIKPEN 100 UNIT/ML KwikPen Generic drug:  insulin lispro INJECT 4 TO 10 UNITS INTO THE SKIN 3 TIMES DAILY WITH MEALS. What changed:  See the new instructions.   Insulin Glargine 300 UNIT/ML Sopn Commonly known as:  TOUJEO MAX SOLOSTAR Inject 12 Units into the skin at bedtime. What changed:    how much to take  when to take this  reasons to take this    levofloxacin 500 MG tablet Commonly known as:  LEVAQUIN Take 1 tablet (500 mg total) by mouth daily for 5 days.   omeprazole 20 MG capsule Commonly known as:  PRILOSEC Take 1 capsule (20 mg total) by mouth daily.   Pancrelipase (Lip-Prot-Amyl) 24000-76000 units Cpep Commonly known as:  CREON TAKE 1 CAPSULE WITH MEALS AND SNACKS UP TO 6 A DAY What changed:    how much to take  how to take this  when to take this   valACYclovir 500 MG tablet Commonly known as:  VALTREX Take 500 mg by mouth daily as needed (outbreak).       Discharged Condition: Improved    Consults: Gastroenterology  Significant Diagnostic Studies: Dg Chest 2 View  Result Date: 05/02/2018 CLINICAL DATA:  Pt reports fever last night and today. Denies other symptoms. Denies urinary symptoms/denies SOB/ denies body aches. Only reports decreased appetite. Reports fever around 5pm today was 102.4HISTORY OF CANCER, ASTHMA, DM EXAM: CHEST - 2 VIEW COMPARISON:  01/04/2018 FINDINGS: Cardiac silhouette is normal in size. Normal mediastinal and hilar contours. Clear lungs.  No pleural effusion or pneumothorax. Skeletal structures are intact. IMPRESSION: No active cardiopulmonary disease. Electronically Signed   By: Lajean Manes M.D.   On: 05/02/2018 20:37   Ct Abdomen Pelvis W Contrast  Result Date: 05/02/2018 CLINICAL DATA:  Elevated bilirubin and LFTs,, abdominal pain, prior Whipple procedure, type II diabetes mellitus, GERD, breast cancer post  LEFT lumpectomy EXAM: CT ABDOMEN AND PELVIS WITH CONTRAST TECHNIQUE: Multidetector CT imaging of the abdomen and pelvis was performed using the standard protocol following bolus administration of intravenous contrast. Sagittal and coronal MPR images reconstructed from axial data set. CONTRAST:  119mL ISOVUE-300 IOPAMIDOL (ISOVUE-300) INJECTION 61% IV. No oral contrast. COMPARISON:  01/10/2017 FINDINGS: Lower chest: Lung bases clear Hepatobiliary: Gallbladder surgically absent.  Cyst lateral segment LEFT lobe liver 14 x 13 mm decreased from the 29 x 21 mm on the previous exam. No other focal hepatic abnormalities. The proximal CBD is mildly prominent at 10 mm diameter though only minimal central intrahepatic biliary dilatation is seen. Pancreas: Prior resection of the pancreatic head and portion of body post Whipple. Atrophy of the remaining pancreatic body and tail with ductal dilatation similar to previous exam. No definite pancreatic mass. Spleen: Normal appearance Adrenals/Urinary Tract: Diffuse thickening of the LEFT adrenal gland without discrete mass. Tiny cyst RIGHT kidney. Adrenal glands, kidneys, ureters and bladder otherwise normal appearance. Stomach/Bowel: Normal appendix. Prior antrectomy with residual stomach demonstrating prominent wall though this may be an artifact related to underdistention. Scattered stool throughout colon. Minimal distal colonic diverticulosis. Unopacified small bowel loops. Soft tissue at the porta hepatis and adjacent to the LEFT lobe of the liver likely represents unopacified small bowel and choledochojejunostomy. Vascular/Lymphatic: Atherosclerotic calcifications aorta and iliac arteries without aneurysm. Few scattered normal size mesenteric lymph nodes in RIGHT mid abdomen. Few normal sized lymph nodes are also seen anterior to the IVC in the upper abdomen and LEFT para-aortic. No adenopathy. Reproductive: Unremarkable uterus and adnexa Other: No free air or free fluid. No hernia or acute inflammatory process. Musculoskeletal: No acute osseous findings. Bones demineralized. Again seen potential vertebral hemangioma at T12. IMPRESSION: Post Whipple procedure with mild dilatation of the CBD 10 mm diameter associated with minimal intrahepatic biliary dilatation; recommend correlation with LFTs. No other definite intra-abdominal or intrapelvic abnormalities. Electronically Signed   By: Lavonia Dana M.D.   On: 05/02/2018 22:30   Mr 3d Recon At  Scanner  Result Date: 05/03/2018 CLINICAL DATA:  Fever, abnormal LFTs, dilated common duct status post Whipple EXAM: MRI ABDOMEN WITHOUT AND WITH CONTRAST (INCLUDING MRCP) TECHNIQUE: Multiplanar multisequence MR imaging of the abdomen was performed both before and after the administration of intravenous contrast. Heavily T2-weighted images of the biliary and pancreatic ducts were obtained, and three-dimensional MRCP images were rendered by post processing. CONTRAST:  5 mL Gadovist IV COMPARISON:  CT abdomen/pelvis dated 05/02/2018 FINDINGS: Lower chest: Lung bases are clear. Hepatobiliary: 12 mm cyst in segment 2 (series 23/image 20). No suspicious enhancing hepatic lesions. No hepatic steatosis. Very mild intrahepatic ductal dilatation in the central left hepatic lobe. Status post cholecystectomy with choledochojejunostomy. Common duct measures 7 mm. No choledocholithiasis is seen. Pancreas: Status post resection of the pancreatic head/uncinate process with pancreaticojejunostomy. Atrophy of the pancreatic body/tail with associated irregular dilatation of the pancreatic duct, most prominent in the proximal pancreatic body (series 23/image 20), chronic. Spleen:  Within normal limits. Adrenals/Urinary Tract:  Adrenal glands are within normal limits. 1.9 cm left lower pole renal cyst. Right kidney is within normal limits. No hydronephrosis. Stomach/Bowel: Status post distal gastrectomy with gastrojejunostomy. Visualized bowel is otherwise unremarkable Vascular/Lymphatic:  No evidence of abdominal aortic aneurysm. No suspicious abdominal lymphadenopathy. Other:  No abdominal ascites. Musculoskeletal: No focal osseous lesions. IMPRESSION: Postsurgical changes related to Whipple procedure, as above. Common duct measures 7 mm, within normal limits. Minimal intrahepatic ductal dilatation in the central left hepatic  lobe. No choledocholithiasis is seen. 12 mm cyst in segment 2 of the liver. No suspicious hepatic lesions.  Irregular ductal dilatation of the pancreatic duct with atrophy of the pancreatic body/tail in this patient status post partial pancreatectomy with pancreaticojejunostomy, chronic. Electronically Signed   By: Julian Hy M.D.   On: 05/03/2018 12:25   Mr Abdomen Mrcp Moise Boring Contast  Result Date: 05/03/2018 CLINICAL DATA:  Fever, abnormal LFTs, dilated common duct status post Whipple EXAM: MRI ABDOMEN WITHOUT AND WITH CONTRAST (INCLUDING MRCP) TECHNIQUE: Multiplanar multisequence MR imaging of the abdomen was performed both before and after the administration of intravenous contrast. Heavily T2-weighted images of the biliary and pancreatic ducts were obtained, and three-dimensional MRCP images were rendered by post processing. CONTRAST:  5 mL Gadovist IV COMPARISON:  CT abdomen/pelvis dated 05/02/2018 FINDINGS: Lower chest: Lung bases are clear. Hepatobiliary: 12 mm cyst in segment 2 (series 23/image 20). No suspicious enhancing hepatic lesions. No hepatic steatosis. Very mild intrahepatic ductal dilatation in the central left hepatic lobe. Status post cholecystectomy with choledochojejunostomy. Common duct measures 7 mm. No choledocholithiasis is seen. Pancreas: Status post resection of the pancreatic head/uncinate process with pancreaticojejunostomy. Atrophy of the pancreatic body/tail with associated irregular dilatation of the pancreatic duct, most prominent in the proximal pancreatic body (series 23/image 20), chronic. Spleen:  Within normal limits. Adrenals/Urinary Tract:  Adrenal glands are within normal limits. 1.9 cm left lower pole renal cyst. Right kidney is within normal limits. No hydronephrosis. Stomach/Bowel: Status post distal gastrectomy with gastrojejunostomy. Visualized bowel is otherwise unremarkable Vascular/Lymphatic:  No evidence of abdominal aortic aneurysm. No suspicious abdominal lymphadenopathy. Other:  No abdominal ascites. Musculoskeletal: No focal osseous lesions. IMPRESSION:  Postsurgical changes related to Whipple procedure, as above. Common duct measures 7 mm, within normal limits. Minimal intrahepatic ductal dilatation in the central left hepatic lobe. No choledocholithiasis is seen. 12 mm cyst in segment 2 of the liver. No suspicious hepatic lesions. Irregular ductal dilatation of the pancreatic duct with atrophy of the pancreatic body/tail in this patient status post partial pancreatectomy with pancreaticojejunostomy, chronic. Electronically Signed   By: Julian Hy M.D.   On: 05/03/2018 12:25    Lab Results: Basic Metabolic Panel: No results for input(s): NA, K, CL, CO2, GLUCOSE, BUN, CREATININE, CALCIUM, MG, PHOS in the last 72 hours. Liver Function Tests: No results for input(s): AST, ALT, ALKPHOS, BILITOT, PROT, ALBUMIN in the last 72 hours.   CBC: No results for input(s): WBC, NEUTROABS, HGB, HCT, MCV, PLT in the last 72 hours.  Recent Results (from the past 240 hour(s))  Urine culture     Status: None   Collection Time: 05/02/18  7:09 PM  Result Value Ref Range Status   Specimen Description   Final    URINE, CLEAN CATCH Performed at [facility], [address], [place], [place] [address]    Special Requests   Final    NONE Performed at [facility], [address] [place], [place] [address]    Culture   Final    NO GROWTH Performed at [facility], [place] [address] [place], [place] [address]    Report Status 05/04/2018 FINAL  Final     Hospital Course: This is a 78 year old who had a Whipple procedure because of a pancreatic tumor about 2 years ago.  She has had trouble with increasing difficulty with diabetes and exocrine pancreatic insufficiency for which she is being treated.  She came to the emergency department with nausea and fever  but did not have abdominal pain.  There was concern that she might have cholangitis.  She was treated with antibiotics given IV fluids and had GI consult.  Clinically she did not appear to have  cholangitis.  It was felt that she would need antibiotics for a few more days.  She was better almost immediately with less fever no abdominal pain no nausea no vomiting.  She was ready for discharge about 36 hours after admission  Discharge Exam: Blood pressure 108/70, pulse 69, temperature 98.1 F (36.7 C), temperature source Oral, resp. rate 18, height 4\' 11"  (1.499 m), weight 67.5 kg, SpO2 99 %. She is awake and alert.  No abdominal tenderness.  She is anxious.  Chest is clear.  Heart is regular  Disposition: Home      Signed: Alonza Bogus   05/07/2018, 8:30 AM

## 2018-05-10 ENCOUNTER — Ambulatory Visit: Payer: Medicare Other | Admitting: Nutrition

## 2018-05-11 DIAGNOSIS — R945 Abnormal results of liver function studies: Secondary | ICD-10-CM | POA: Diagnosis not present

## 2018-05-11 DIAGNOSIS — E119 Type 2 diabetes mellitus without complications: Secondary | ICD-10-CM | POA: Diagnosis not present

## 2018-05-11 LAB — HEPATIC FUNCTION PANEL
AG Ratio: 1.1 (calc) (ref 1.0–2.5)
ALT: 35 U/L — ABNORMAL HIGH (ref 6–29)
AST: 15 U/L (ref 10–35)
Albumin: 3.6 g/dL (ref 3.6–5.1)
Alkaline phosphatase (APISO): 152 U/L — ABNORMAL HIGH (ref 33–130)
BILIRUBIN DIRECT: 0.2 mg/dL (ref 0.0–0.2)
Globulin: 3.4 g/dL (calc) (ref 1.9–3.7)
Indirect Bilirubin: 0.5 mg/dL (calc) (ref 0.2–1.2)
Total Bilirubin: 0.7 mg/dL (ref 0.2–1.2)
Total Protein: 7 g/dL (ref 6.1–8.1)

## 2018-05-12 ENCOUNTER — Telehealth: Payer: Self-pay | Admitting: Internal Medicine

## 2018-05-12 NOTE — Telephone Encounter (Signed)
Pt was returning a call from AM. Please call [phone]

## 2018-05-12 NOTE — Progress Notes (Signed)
Transaminases nearly normalized following hospitalization. Mild elevation alk phos. Overall, much improved. Keep appt with Dr. Ardis Hughs in Feb 2020.

## 2018-05-13 NOTE — Telephone Encounter (Signed)
Pt notified of AB recommendations.

## 2018-05-13 NOTE — Telephone Encounter (Signed)
Lmom, waiting on a return call.  

## 2018-05-13 NOTE — Telephone Encounter (Signed)
I would continue the Creon.

## 2018-05-13 NOTE — Telephone Encounter (Signed)
Pt walked in office to ask about her results again. Pts results were given yesterday and read  Transaminases nearly normalized following hospitalization. Mild elevation alk phos. Overall, much improved. Keep appt with Dr. Ardis Hughs in Feb 2020.      Pt is concerned about taking the Creon. She has been on it for about 10 months. PT mentioned that her surgeon in [facility] didn't want her to take Creon. Pt had a removal of gallbladder, some intestines. Pt wanted to make sure that the medication wasn't affected her levels (blood work). Pt will followup with Dr. Ardis Hughs and said he isn't aware of her surgeries since she hasn't seen him.

## 2018-06-04 ENCOUNTER — Other Ambulatory Visit: Payer: Self-pay | Admitting: "Endocrinology

## 2018-06-08 ENCOUNTER — Ambulatory Visit (INDEPENDENT_AMBULATORY_CARE_PROVIDER_SITE_OTHER): Payer: Medicare Other | Admitting: Gastroenterology

## 2018-06-08 ENCOUNTER — Encounter: Payer: Self-pay | Admitting: Gastroenterology

## 2018-06-08 ENCOUNTER — Other Ambulatory Visit (INDEPENDENT_AMBULATORY_CARE_PROVIDER_SITE_OTHER): Payer: Medicare Other

## 2018-06-08 VITALS — BP 100/62 | HR 85 | Ht 64.0 in | Wt 148.6 lb

## 2018-06-08 DIAGNOSIS — R945 Abnormal results of liver function studies: Secondary | ICD-10-CM

## 2018-06-08 DIAGNOSIS — R7989 Other specified abnormal findings of blood chemistry: Secondary | ICD-10-CM

## 2018-06-08 LAB — HEPATIC FUNCTION PANEL
ALT: 35 U/L (ref 0–35)
AST: 22 U/L (ref 0–37)
Albumin: 3.7 g/dL (ref 3.5–5.2)
Alkaline Phosphatase: 143 U/L — ABNORMAL HIGH (ref 39–117)
Bilirubin, Direct: 0.2 mg/dL (ref 0.0–0.3)
Total Bilirubin: 0.9 mg/dL (ref 0.2–1.2)
Total Protein: 7 g/dL (ref 6.0–8.3)

## 2018-06-08 NOTE — Progress Notes (Signed)
Review of pertinent gastrointestinal problems: 1.  Mucinous cystic neoplasm of the pancreas, underwent Whipple surgery with Dr. Birdie Sons in 2016 I believe.  Endoscopic ultrasound 2016 Dr. Ardis Hughs found multiple cysts throughout her pancreas.  HPI: This is a very pleasant 78 year old woman who was last here in our office about 2 years ago.  She is here today for a new problem  She was actually here in the office about 2 years ago and she saw Amy Esterwood.  She had a history of colonoscopy with Dr. Verl Blalock in 2014 and he removed a single 3 mm adenomatous polyp.  A CT scan in 2018 suggested some rectal thickening and she was scheduled to have a colonoscopy with me to evaluate that further.  She canceled the appointment and we have actually not heard from her since.  About a month ago she was hospitalized at [facility] for a 3 night stay.  She had fevers, chills, her liver tests elevated.  She never had abdominal pains.  she was put on IV antibiotics and transitioned to oral antibiotics.  Her liver tests normalized.  She had imaging studies including a CT scan and an MRI.  See those below.  Since leaving the hospital she has felt fine.    MRI January 2020 Postsurgical changes related to Whipple procedure, as above. Common duct measures 7 mm, within normal limits. Minimal intrahepatic ductal dilatation in the central left hepatic lobe. No choledocholithiasis is seen. 12 mm cyst in segment 2 of the liver. No suspicious hepatic lesions. Irregular ductal dilatation of the pancreatic duct with atrophy of the pancreatic body/tail in this patient status post partial pancreatectomy with pancreaticojejunostomy, chronic.   CT scan January 2020 Post Whipple procedure with mild dilatation of the CBD 10 mm diameter associated with minimal intrahepatic biliary dilatation; recommend correlation with LFTs.    Review of systems: Pertinent positive and negative review of systems  were noted in the above HPI section. All other review negative.   Past Medical History:  Diagnosis Date  . Anxiety   . Anxiety and depression   . Asthma   . Breast carcinoma ([place]) 08/2009   Invasive ductal; left; lumpectomy and sentinel node excision- oncology visits yearly.  . Chest pain   . Depression   . Diabetes mellitus type II, controlled ([place])    diet controlled,exercise, no meds  . Diverticulitis 2012   Treated medically  . Gastroesophageal reflux   . Hyperlipidemia   . Nephrolithiasis   . Tick bite    [place] Tick, alpha gal positive, makes her allergic to meat    Past Surgical History:  Procedure Laterality Date  . BREAST LUMPECTOMY     Left  . CESAREAN SECTION    . CHOLECYSTECTOMY    . COLONOSCOPY  2004  . DILATION AND CURETTAGE OF UTERUS    . EUS N/A 09/07/2014   Procedure: UPPER ENDOSCOPIC ULTRASOUND (EUS) LINEAR;  Surgeon: Milus Banister, MD;  Location: [facility];  Service: Endoscopy;  Laterality: N/A;  . HERNIA REPAIR    . NEPHROLITHOTOMY    . TUBAL LIGATION    . WHIPPLE PROCEDURE      Current Outpatient Medications  Medication Sig Dispense Refill  . albuterol (PROVENTIL HFA;VENTOLIN HFA) 108 (90 BASE) MCG/ACT inhaler Inhale 2 puffs into the lungs every 6 (six) hours as needed for wheezing.    . dicyclomine (BENTYL) 10 MG capsule Take 1 tablet once or twice daily as needed for abdominal pain and gas. (  Patient taking differently: Take 10 mg by mouth 2 (two) times daily as needed (FOR ABDOMINAL PAIN/GAS). ) 30 capsule 3  . fluticasone furoate-vilanterol (BREO ELLIPTA) 200-25 MCG/INH AEPB Inhale 1 puff into the lungs daily.    . Insulin Glargine (TOUJEO MAX SOLOSTAR) 300 UNIT/ML SOPN Inject 12 Units into the skin at bedtime. (Patient taking differently: Inject 9-12 Units into the skin at bedtime as needed (FOR HIGH BLOOD SUGAR LEVELS OVER 230). ) 3 pen 2  . insulin lispro (HUMALOG) 100 UNIT/ML injection Inject into the skin 3 (three) times daily before  meals.    Marland Kitchen omeprazole (PRILOSEC) 20 MG capsule Take 1 capsule (20 mg total) by mouth daily. 14 capsule 0  . Pancrelipase, Lip-Prot-Amyl, (CREON) 24000-76000 units CPEP TAKE 1 CAPSULE WITH MEALS AND SNACKS UP TO 6 A DAY (Patient taking differently: Take 1 capsule by mouth 3 (three) times daily with meals. TAKE 1 CAPSULE WITH MEALS AND SNACKS UP TO 6 A DAY) 180 capsule 2  . valACYclovir (VALTREX) 500 MG tablet Take 500 mg by mouth daily as needed (outbreak).      No current facility-administered medications for this visit.     Allergies as of 06/08/2018 - Review Complete 06/08/2018  Allergen Reaction Noted  . Biaxin [clarithromycin] Other (See Comments) 11/25/2012  . Carafate [sucralfate] Other (See Comments) 10/07/2016  . Codeine Other (See Comments) 08/21/2010  . Doxycycline Other (See Comments) 10/07/2016  . Meat [alpha-gal]  05/02/2018  . Metronidazole Other (See Comments) 11/25/2012  . Cephalosporins Hives and Palpitations 11/25/2012    Family History  Problem Relation Age of Onset  . Coronary artery disease Mother   . Stroke Father   . Diabetes Mellitus II Father   . Colon cancer Neg Hx     Social History   Socioeconomic History  . Marital status: Divorced    Spouse name: Not on file  . Number of children: 2  . Years of education: Not on file  . Highest education level: Not on file  Occupational History  . Occupation: Tourist information centre manager for [employer]  Social Needs  . Financial resource strain: Not on file  . Food insecurity:    Worry: Not on file    Inability: Not on file  . Transportation needs:    Medical: Not on file    Non-medical: Not on file  Tobacco Use  . Smoking status: Never Smoker  . Smokeless tobacco: Never Used  Substance and Sexual Activity  . Alcohol use: No  . Drug use: No  . Sexual activity: Not Currently  Lifestyle  . Physical activity:    Days per week: Not on file    Minutes per session: Not on file  . Stress: Not on file  Relationships  . Social  connections:    Talks on phone: Not on file    Gets together: Not on file    Attends religious service: Not on file    Active member of club or organization: Not on file    Attends meetings of clubs or organizations: Not on file    Relationship status: Not on file  . Intimate partner violence:    Fear of current or ex partner: Not on file    Emotionally abused: Not on file    Physically abused: Not on file    Forced sexual activity: Not on file  Other Topics Concern  . Not on file  Social History Narrative  . Not on file     Physical Exam: BP 100/62  Pulse 85   Ht 5\' 4"  (1.626 m)   Wt 148 lb 9.6 oz (67.4 kg)   BMI 25.51 kg/m  Constitutional: generally well-appearing Psychiatric: alert and oriented x3 Eyes: extraocular movements intact Mouth: oral pharynx moist, no lesions Neck: supple no lymphadenopathy Cardiovascular: heart regular rate and rhythm Lungs: clear to auscultation bilaterally Abdomen: soft, nontender, nondistended, no obvious ascites, no peritoneal signs, normal bowel sounds Extremities: no lower extremity edema bilaterally Skin: no lesions on visible extremities   Assessment and plan: 78 y.o. female with history of 2016 Whipple surgery for mucinous cystic neoplasm of the pancreas, gallbladder was removed at the same time, now with brief stay at outside hospital with fever, elevated liver tests  Her [facility] hospitalization does seem consistent with biliary infection.  She is status post a Whipple surgery and so her upper GI anatomy, biliary anatomy is significantly altered now.  There is no sign on MRI or CT scan that she has a retained stone in her bile duct, if she did ERCP given her altered anatomy would be technically very challenging and something that I would not be able to help her with.  1 of my newer partners may be able to help her with an ERCP such as that however.  Since we do not know if she has a retained stone I recommended repeat liver tests  now and also we talked about repeating imaging with an MRCP.  She was mortified at the prospect of doing another MRCP even with oral sedation medicine.  She just does not want to go through that again.  Possibly if her liver tests have risen again she may be more convinced.  I suppose it is also possible that she had an acute viral hepatitis that resolved.  Hepatitis panel was not checked while she was admitted but I will check viral serologies for her now.  She will return to see me in 3 or 4 months and sooner if she has recurrent biliary-like symptoms.    Please see the "Patient Instructions" section for addition details about the plan.   Owens Loffler, MD [facility] 06/08/2018, 9:32 AM  Cc: Sinda Du, MD

## 2018-06-08 NOTE — Patient Instructions (Addendum)
You will have labs checked today in the basement lab.  Please head down after you check out with the front desk  (lfts)  Please return to see Dr. Ardis Hughs in 3-4 months.  Thank you for entrusting me with your care and choosing [facility].  Dr Ardis Hughs

## 2018-06-09 LAB — HEPATITIS A ANTIBODY, TOTAL: Hepatitis A AB,Total: NONREACTIVE

## 2018-06-09 LAB — HEPATITIS B SURFACE ANTIBODY,QUALITATIVE: HEP B S AB: NONREACTIVE

## 2018-06-09 LAB — HEPATITIS C ANTIBODY
Hepatitis C Ab: NONREACTIVE
SIGNAL TO CUT-OFF: 0.01 (ref <1.00)

## 2018-06-09 LAB — HEPATITIS B SURFACE ANTIGEN: HEP B S AG: NONREACTIVE

## 2018-06-21 ENCOUNTER — Telehealth: Payer: Self-pay | Admitting: Gastroenterology

## 2018-06-21 NOTE — Telephone Encounter (Signed)
Dr. Ardis Hughs, pt returned your call.

## 2018-06-22 NOTE — Telephone Encounter (Signed)
See results note. 

## 2018-07-08 ENCOUNTER — Other Ambulatory Visit: Payer: Self-pay | Admitting: "Endocrinology

## 2018-07-27 ENCOUNTER — Ambulatory Visit: Payer: Medicare Other | Admitting: Gastroenterology

## 2018-07-28 ENCOUNTER — Other Ambulatory Visit: Payer: Self-pay | Admitting: "Endocrinology

## 2018-08-10 ENCOUNTER — Other Ambulatory Visit: Payer: Self-pay | Admitting: "Endocrinology

## 2018-09-02 ENCOUNTER — Other Ambulatory Visit: Payer: Self-pay | Admitting: "Endocrinology

## 2018-10-08 DIAGNOSIS — E119 Type 2 diabetes mellitus without complications: Secondary | ICD-10-CM | POA: Diagnosis not present

## 2018-10-08 LAB — HEMOGLOBIN A1C: Hemoglobin A1C: 8.3

## 2018-11-29 ENCOUNTER — Other Ambulatory Visit: Payer: Self-pay

## 2019-01-18 DIAGNOSIS — D225 Melanocytic nevi of trunk: Secondary | ICD-10-CM | POA: Diagnosis not present

## 2019-01-18 DIAGNOSIS — Z1283 Encounter for screening for malignant neoplasm of skin: Secondary | ICD-10-CM | POA: Diagnosis not present

## 2019-02-24 DIAGNOSIS — Z23 Encounter for immunization: Secondary | ICD-10-CM | POA: Diagnosis not present

## 2019-03-15 DIAGNOSIS — D649 Anemia, unspecified: Secondary | ICD-10-CM

## 2019-03-15 DIAGNOSIS — E785 Hyperlipidemia, unspecified: Secondary | ICD-10-CM

## 2019-03-15 DIAGNOSIS — J441 Chronic obstructive pulmonary disease with (acute) exacerbation: Secondary | ICD-10-CM

## 2019-03-15 DIAGNOSIS — R748 Abnormal levels of other serum enzymes: Secondary | ICD-10-CM

## 2019-03-15 DIAGNOSIS — E119 Type 2 diabetes mellitus without complications: Secondary | ICD-10-CM

## 2019-03-15 DIAGNOSIS — M5440 Lumbago with sciatica, unspecified side: Secondary | ICD-10-CM

## 2019-03-15 DIAGNOSIS — F329 Major depressive disorder, single episode, unspecified: Secondary | ICD-10-CM

## 2019-04-27 ENCOUNTER — Ambulatory Visit (INDEPENDENT_AMBULATORY_CARE_PROVIDER_SITE_OTHER): Payer: Medicare Other | Admitting: Family Medicine

## 2019-04-27 ENCOUNTER — Other Ambulatory Visit: Payer: Self-pay

## 2019-04-27 ENCOUNTER — Encounter: Payer: Self-pay | Admitting: Family Medicine

## 2019-04-27 VITALS — BP 99/70 | HR 75 | Temp 98.0°F | Ht 60.75 in | Wt 150.8 lb

## 2019-04-27 DIAGNOSIS — E1169 Type 2 diabetes mellitus with other specified complication: Secondary | ICD-10-CM

## 2019-04-27 DIAGNOSIS — E785 Hyperlipidemia, unspecified: Secondary | ICD-10-CM | POA: Diagnosis not present

## 2019-04-27 DIAGNOSIS — K869 Disease of pancreas, unspecified: Secondary | ICD-10-CM

## 2019-04-27 DIAGNOSIS — Z780 Asymptomatic menopausal state: Secondary | ICD-10-CM

## 2019-04-27 DIAGNOSIS — K219 Gastro-esophageal reflux disease without esophagitis: Secondary | ICD-10-CM

## 2019-04-27 DIAGNOSIS — C50912 Malignant neoplasm of unspecified site of left female breast: Secondary | ICD-10-CM | POA: Insufficient documentation

## 2019-04-27 DIAGNOSIS — B009 Herpesviral infection, unspecified: Secondary | ICD-10-CM

## 2019-04-27 DIAGNOSIS — J45909 Unspecified asthma, uncomplicated: Secondary | ICD-10-CM

## 2019-04-27 NOTE — Patient Instructions (Signed)
6 months-asthma/DM

## 2019-04-27 NOTE — Progress Notes (Signed)
New Patient Office Visit  Subjective:  Patient ID: Betty Nguyen, female    DOB: [birth year]  Age: 78 y.o. MRN: [id number]  CC:  Chief Complaint  Patient presents with  . Establish Care  . 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 She has in the interval since our last visit had a CT scan through the [facility] in [place] and in June of this year had no evidence of residual or recurrent neoplasm or malignancy. Her small cystic lesion of the pancreatic tail 12 x 10 mm was stable. There was atrophy of the remaining pancreas. Of note, in the interval, she has become fully diabetic. She has continued to be challenged with hemoglobin A1c's in the 9 range. She has had some symptoms from her incisional hernia for which she saw Dr. Bailey Mech at the end of last year, he had offered her repair. She was not at that time ready to undergo repair. We have discussed somewhat the nature of hernias, incisional hernias the natural history and the various treatment options. I have discussed with her that I have not been doing these in the last several years and I believe she would be an excellent hands with Dr. Bailey Mech. I have indicated that the primary contributing factor. She could manage would be to gain tighter control of her diabetes with hemoglobin A1c's in the 6 range if possible. She wishes to wait some six months before seeing Dr. Carlis Abbott. I do not believe she needs further followup for neoplastic reasons and we will arrange an appointment with him at that time.  HPI Betty Nguyen presents for DM-diagnosis after whipple procedure- Past Medical History:  Diagnosis Date  . Abnormal levels of other serum enzymes   . Anemia, unspecified   . Anxiety   . Anxiety and depression   . Anxiety disorder, unspecified   . Asthma   . Breast carcinoma ([place]) 08/2009   Invasive ductal; left;  lumpectomy and sentinel node excision- oncology visits yearly.  . Chest pain   . Chronic obstructive pulmonary disease with (acute) exacerbation ([place])   . Depression   . Diabetes mellitus type II, controlled ([place])    diet controlled,exercise, no meds  . Diverticulitis 2012   Treated medically  . Exocrine pancreatic insufficiency   . Gastroesophageal reflux   . Hyperlipidemia   . Hyperlipidemia, unspecified   . Incisional hernia without obstruction or gangrene   . Lumbago with sciatica, unspecified side   . Major depressive disorder, single episode, unspecified   . Malignant neoplasm of unspecified site of unspecified female breast ([place])   . Menopause   . Mild persistent asthma with (acute) exacerbation   . Mild persistent asthma, uncomplicated   . Nephrolithiasis   . Other acariasis   . Other allergy, subsequent encounter   . Other specified diseases of pancreas   . Tick bite    [place] Tick, alpha gal positive, makes her allergic to meat  . Type 2 diabetes mellitus without complications ([place])   . Uterine prolapse     Past Surgical History:  Procedure Laterality Date  . BREAST LUMPECTOMY     Left  . CESAREAN SECTION    . CHOLECYSTECTOMY    . COLONOSCOPY  2004  . DILATION AND CURETTAGE OF UTERUS    . EUS N/A 09/07/2014   Procedure: UPPER ENDOSCOPIC ULTRASOUND (EUS) LINEAR;  Surgeon: Milus Banister,  MD;  Location: [facility];  Service: Endoscopy;  Laterality: N/A;  . HERNIA REPAIR    . NEPHROLITHOTOMY    . TUBAL LIGATION    . WHIPPLE PROCEDURE      Family History  Problem Relation Age of Onset  . Coronary artery disease Mother   . Stroke Father   . Diabetes Mellitus II Father   . Colon cancer Neg Hx     Social History   Socioeconomic History  . Marital status: Divorced    Spouse name: Not on file  . Number of children: 2  . Years of education: Not on file  . Highest education level: Not on file  Occupational History  . Occupation: Tourist information centre manager for [employer]   Tobacco Use  . Smoking status: Never Smoker  . Smokeless tobacco: Never Used  Substance and Sexual Activity  . Alcohol use: No  . Drug use: No  . Sexual activity: Not Currently  Other Topics Concern  . Not on file  Social History Narrative  . Not on file   Social Determinants of Health   Financial Resource Strain:   . Difficulty of Paying Living Expenses: Not on file  Food Insecurity:   . Worried About Charity fundraiser in the Last Year: Not on file  . Ran Out of Food in the Last Year: Not on file  Transportation Needs:   . Lack of Transportation (Medical): Not on file  . Lack of Transportation (Non-Medical): Not on file  Physical Activity:   . Days of Exercise per Week: Not on file  . Minutes of Exercise per Session: Not on file  Stress:   . Feeling of Stress : Not on file  Social Connections:   . Frequency of Communication with Friends and Family: Not on file  . Frequency of Social Gatherings with Friends and Family: Not on file  . Attends Religious Services: Not on file  . Active Member of Clubs or Organizations: Not on file  . Attends Archivist Meetings: Not on file  . Marital Status: Not on file  Intimate Partner Violence:   . Fear of Current or Ex-Partner: Not on file  . Emotionally Abused: Not on file  . Physically Abused: Not on file  . Sexually Abused: Not on file    ROS Review of Systems  Constitutional: Positive for fatigue.  HENT: Positive for congestion, facial swelling, hearing loss and sinus pressure.   Eyes: Positive for photophobia, pain, redness, itching and visual disturbance.  Respiratory: Positive for chest tightness, shortness of breath and wheezing.   Cardiovascular: Positive for chest pain.  Gastrointestinal: Positive for constipation and nausea.  Endocrine: Positive for polyuria.  Genitourinary: Positive for frequency, pelvic pain and urgency.  Musculoskeletal: Positive for arthralgias, back pain, gait problem, joint swelling,  myalgias, neck pain and neck stiffness.  Skin: Positive for rash. Negative for color change.  Allergic/Immunologic: Positive for environmental allergies, food allergies and immunocompromised state.  Neurological: Positive for dizziness.  Hematological: Bruises/bleeds easily.  Psychiatric/Behavioral: Positive for agitation, confusion, decreased concentration, dysphoric mood and sleep disturbance. The patient is nervous/anxious and is hyperactive.     Objective:   Today's Vitals: BP 99/70 (BP Location: Right Arm, Patient Position: Sitting, Cuff Size: Normal)   Pulse 75   Temp 98 F (36.7 C) (Oral)   Ht 5' 0.75" (1.543 m)   Wt 150 lb 12.8 oz (68.4 kg)   SpO2 97%   BMI 28.73 kg/m   Physical Exam Vitals reviewed.  Constitutional:  Appearance: Normal appearance.  HENT:     Head: Normocephalic and atraumatic.  Eyes:     Conjunctiva/sclera: Conjunctivae normal.  Cardiovascular:     Rate and Rhythm: Normal rate and regular rhythm.     Pulses: Normal pulses.     Heart sounds: Normal heart sounds.  Pulmonary:     Effort: Pulmonary effort is normal.     Breath sounds: Normal breath sounds.  Musculoskeletal:        General: Normal range of motion.     Cervical back: Normal range of motion and neck supple.  Neurological:     Mental Status: She is alert and oriented to person, place, and time.  Psychiatric:        Mood and Affect: Mood normal.        Behavior: Behavior normal.     Assessment & Plan:   1. 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[place]) Pt seen by endo -regular insulin +basal-discussed at length reason for need to use both types of insulin for improved control  2. Malignant neoplasm of left female breast, unspecified estrogen receptor status, unspecified site of breast ([place])  continue mammograms-completed tamoxifen and radiation.   3. Encounter for screening mammogram for malignant neoplasm of breast   4. Menopause High risk  osteoporosis-DEXA-Calcium +Vit D - DG Bone Density; Future  Hyperlipidemia, unspecified hyperlipidemia type  5. Gastroesophageal reflux disease without esophagitis prilosec-stable, bentyl prn spasms  6. Mild asthma without complication, unspecified whether persistent Breo, albuterol prn  Herpes-valtrex intermittently  Outpatient Encounter Medications as of 04/27/2019  Medication Sig  . fluconazole (DIFLUCAN) 150 MG tablet Take 300 mg by mouth daily.  Marland Kitchen levofloxacin (LEVAQUIN) 500 MG tablet Take 500 mg by mouth daily.  Marland Kitchen albuterol (PROVENTIL HFA;VENTOLIN HFA) 108 (90 BASE) MCG/ACT inhaler Inhale 2 puffs into the lungs every 6 (six) hours as needed for wheezing.  . dicyclomine (BENTYL) 10 MG capsule Take 1 tablet once or twice daily as needed for abdominal pain and gas. (Patient taking differently: Take 10 mg by mouth 2 (two) times daily as needed (FOR ABDOMINAL PAIN/GAS). )  . fluticasone furoate-vilanterol (BREO ELLIPTA) 200-25 MCG/INH AEPB Inhale 1 puff into the lungs daily.  . Insulin Glargine (TOUJEO MAX SOLOSTAR) 300 UNIT/ML SOPN Inject 12 Units into the skin at bedtime. (Patient taking differently: Inject 9-12 Units into the skin at bedtime as needed (FOR HIGH BLOOD SUGAR LEVELS OVER 230). )  . insulin lispro (HUMALOG) 100 UNIT/ML injection Inject into the skin 3 (three) times daily before meals.  Marland Kitchen omeprazole (PRILOSEC) 20 MG capsule Take 1 capsule (20 mg total) by mouth daily.  . Pancrelipase, Lip-Prot-Amyl, (CREON) 24000-76000 units CPEP TAKE 1 CAPSULE WITH MEALS AND SNACKS UP TO 6 A DAY (Patient taking differently: Take 1 capsule by mouth 3 (three) times daily with meals. TAKE 1 CAPSULE WITH MEALS AND SNACKS UP TO 6 A DAY)  . valACYclovir (VALTREX) 500 MG tablet Take 500 mg by mouth daily as needed (outbreak).    No facility-administered encounter medications on file as of 04/27/2019.    Follow-up: 6 months  Zacchary Pompei Hannah Beat, MD

## 2019-04-28 DIAGNOSIS — B009 Herpesviral infection, unspecified: Secondary | ICD-10-CM | POA: Insufficient documentation

## 2019-05-11 ENCOUNTER — Other Ambulatory Visit (HOSPITAL_COMMUNITY): Payer: Self-pay | Admitting: Family Medicine

## 2019-05-11 DIAGNOSIS — Z1231 Encounter for screening mammogram for malignant neoplasm of breast: Secondary | ICD-10-CM

## 2019-05-16 ENCOUNTER — Telehealth (INDEPENDENT_AMBULATORY_CARE_PROVIDER_SITE_OTHER): Payer: Medicare Other | Admitting: Family Medicine

## 2019-05-16 ENCOUNTER — Other Ambulatory Visit: Payer: Self-pay

## 2019-05-16 ENCOUNTER — Ambulatory Visit (HOSPITAL_COMMUNITY)
Admission: RE | Admit: 2019-05-16 | Discharge: 2019-05-16 | Disposition: A | Discharge status: Home / Self Care | Payer: Medicare Other | Source: Ambulatory Visit | Attending: Family Medicine | Admitting: Family Medicine

## 2019-05-16 ENCOUNTER — Encounter: Payer: Self-pay | Admitting: Family Medicine

## 2019-05-16 VITALS — BP 99/70 | Ht 60.25 in | Wt 150.0 lb

## 2019-05-16 DIAGNOSIS — Z1231 Encounter for screening mammogram for malignant neoplasm of breast: Secondary | ICD-10-CM | POA: Insufficient documentation

## 2019-05-16 DIAGNOSIS — J329 Chronic sinusitis, unspecified: Secondary | ICD-10-CM | POA: Diagnosis not present

## 2019-05-16 MED ORDER — AZITHROMYCIN 250 MG PO TABS: ORAL_TABLET | ORAL | 0 refills | Status: DC

## 2019-05-16 NOTE — Progress Notes (Signed)
Virtual Visit via Telephone Note  I connected with Betty Nguyen on 05/16/19 at  1:40 PM EST by telephone and verified that I am speaking with the correct person using two identifiers.  Location: Patient: home Provider: office  I discussed the limitations, risks, security and privacy concerns of performing an evaluation and management service by telephone and the availability of in person appointments. I also discussed with the patient that there may be a patient responsible charge related to this service. The patient expressed understanding and agreed to proceed.   History of Present Illness: Pt with concern for congestion with facial pressure and tooth pain. Pt states  cough. No fever.  Pt notes mucous drainage from the back of the throat. Pt states sinus infections yearly with medications needed to resolve. Pt with COPD and asthma-concern for trigger if not treated Observations/Objective: No vitals  Assessment and Plan: 1. Sinusitis, unspecified chronicity, unspecified location zithromax-rx-pt states she has used in the past with success when treating sinusitis-pt with many allergies to medications but states she has no difficulty taking zithromax, mucinex, fluids   I discussed the assessment and treatment plan with the patient. The patient was provided an opportunity to ask questions and all were answered. The patient agreed with the plan and demonstrated an understanding of the instructions.   The patient was advised to call back or seek an in-person evaluation if the symptoms worsen or if the condition fails to improve as anticipated.  I provided 8 minutes of non-face-to-face time during this encounter.   Drevon Plog Hannah Beat, MD

## 2019-05-19 ENCOUNTER — Ambulatory Visit (HOSPITAL_COMMUNITY): Payer: Medicare Other

## 2019-05-19 DIAGNOSIS — J329 Chronic sinusitis, unspecified: Secondary | ICD-10-CM | POA: Insufficient documentation

## 2019-05-23 ENCOUNTER — Ambulatory Visit (HOSPITAL_COMMUNITY)
Admission: RE | Admit: 2019-05-23 | Discharge: 2019-05-23 | Disposition: A | Discharge status: Home / Self Care | Payer: Medicare Other | Source: Ambulatory Visit | Attending: Family Medicine | Admitting: Family Medicine

## 2019-05-23 ENCOUNTER — Other Ambulatory Visit: Payer: Self-pay

## 2019-05-23 DIAGNOSIS — Z1231 Encounter for screening mammogram for malignant neoplasm of breast: Secondary | ICD-10-CM

## 2019-07-12 ENCOUNTER — Telehealth: Payer: Self-pay | Admitting: Family Medicine

## 2019-07-12 NOTE — Telephone Encounter (Signed)
Per Caryl Asp, if patient is needing a Endocrinology doctor she can not be scheduled with Dr. Dorris Fetch.

## 2019-07-12 NOTE — Telephone Encounter (Signed)
Patient is calling and states she is needing the censors for her diabetic supply. Patient would like a call back once this is called in. She states Dr. Luan Pulling was prescribing this and now is needing a prescription sent in from Dr. Holly Bodily.   [facility] [place], [place] Phone:  [phone]  Fax:  [phone]

## 2019-07-13 ENCOUNTER — Other Ambulatory Visit: Payer: Self-pay | Admitting: Family Medicine

## 2019-07-13 DIAGNOSIS — E119 Type 2 diabetes mellitus without complications: Secondary | ICD-10-CM

## 2019-07-13 DIAGNOSIS — K8681 Exocrine pancreatic insufficiency: Secondary | ICD-10-CM

## 2019-07-13 NOTE — Telephone Encounter (Signed)
Please advise! Do we need to get this patient scheduled to see you? I do not see a referral in for her. Patient also have not been seen since Dec 2020 for her diabetes?

## 2019-07-13 NOTE — Telephone Encounter (Signed)
My understanding at the time of the appointment was patient was seen by endocrinology. I will be happy to refer her but can not send in diabetic supplies. That would be under a diabetic specialist-endo.  Let me know her decision.  We can refer to [facility]

## 2019-07-13 NOTE — Telephone Encounter (Signed)
Patient was a patient of Dr Dorris Fetch but when she went back to Dr Luan Pulling then he left now can not get back in with Dr Dorris Fetch. She is willing to be referred to Endo in [place] but will need to get refills until she can get in with Endo

## 2019-07-13 NOTE — Telephone Encounter (Signed)
Left a msg on machine to return our call with the supply she will need and we may fill them until she gets in with another Endo. We also put in the referral for the Endo specialist

## 2019-07-13 NOTE — Telephone Encounter (Signed)
Find out exactly what she needs. I sent a referral to endo. We will try to help her with what she needs but she needs to follow up with endo

## 2019-07-14 ENCOUNTER — Other Ambulatory Visit: Payer: Self-pay | Admitting: Emergency Medicine

## 2019-07-14 DIAGNOSIS — E119 Type 2 diabetes mellitus without complications: Secondary | ICD-10-CM

## 2019-07-14 MED ORDER — FREESTYLE LIBRE SENSOR SYSTEM MISC: 2 refills | Status: DC

## 2019-07-14 NOTE — Telephone Encounter (Signed)
Patient came into the office today and I informed her that we would be referring her to an endo doctor and they would be taking over this. She states she is needing Elenor Legato censor sent to the pharmacy below.    [place], [place] Phone:  [phone]  Fax:  [phone]

## 2019-07-28 ENCOUNTER — Other Ambulatory Visit: Payer: Self-pay | Admitting: Emergency Medicine

## 2019-07-28 ENCOUNTER — Telehealth: Payer: Self-pay | Admitting: Family Medicine

## 2019-07-28 DIAGNOSIS — E119 Type 2 diabetes mellitus without complications: Secondary | ICD-10-CM

## 2019-07-28 NOTE — Telephone Encounter (Signed)
Patient is calling and states she needs a RX called into the pharmacy for [place], [place] Phone:  [phone]  Fax:  [phone]

## 2019-08-04 ENCOUNTER — Other Ambulatory Visit: Payer: Self-pay | Admitting: Emergency Medicine

## 2019-08-04 ENCOUNTER — Telehealth: Payer: Self-pay | Admitting: Family Medicine

## 2019-08-04 DIAGNOSIS — E119 Type 2 diabetes mellitus without complications: Secondary | ICD-10-CM

## 2019-08-04 MED ORDER — FREESTYLE LITE TEST VI STRP: ORAL_STRIP | 12 refills | Status: DC

## 2019-08-04 NOTE — Telephone Encounter (Signed)
done

## 2019-08-04 NOTE — Telephone Encounter (Signed)
Patient is calling and requesting a refill on freestyle blood glucose test strips. Patient states she sees the diabetic doctor for first time in [place] on May 11.  [place], [place] Phone:  [phone]  Fax:  [phone]

## 2019-09-02 ENCOUNTER — Other Ambulatory Visit: Payer: Self-pay

## 2019-09-06 ENCOUNTER — Ambulatory Visit (INDEPENDENT_AMBULATORY_CARE_PROVIDER_SITE_OTHER): Payer: PPO | Admitting: Internal Medicine

## 2019-09-06 ENCOUNTER — Other Ambulatory Visit: Payer: Self-pay

## 2019-09-06 ENCOUNTER — Encounter: Payer: Self-pay | Admitting: Internal Medicine

## 2019-09-06 VITALS — BP 122/62 | HR 79 | Temp 98.8°F | Ht 60.0 in | Wt 151.8 lb

## 2019-09-06 DIAGNOSIS — Z794 Long term (current) use of insulin: Secondary | ICD-10-CM | POA: Diagnosis not present

## 2019-09-06 DIAGNOSIS — R739 Hyperglycemia, unspecified: Secondary | ICD-10-CM

## 2019-09-06 DIAGNOSIS — E1165 Type 2 diabetes mellitus with hyperglycemia: Secondary | ICD-10-CM | POA: Insufficient documentation

## 2019-09-06 LAB — POCT GLYCOSYLATED HEMOGLOBIN (HGB A1C): Hemoglobin A1C: 8.2 % — AB (ref 4.0–5.6)

## 2019-09-06 MED ORDER — TOUJEO MAX SOLOSTAR 300 UNIT/ML ~~LOC~~ SOPN: 8.0000 [IU] | PEN_INJECTOR | Freq: Every day | SUBCUTANEOUS | 6 refills | Status: AC

## 2019-09-06 NOTE — Progress Notes (Signed)
Name: Betty Nguyen  MRN/ DOB: [id number], May 11, 1940   Age/ Sex: 79 y.o., female    PCP: Maryruth Hancock, MD   Reason for Endocrinology Evaluation: Type 2 Diabetes Mellitus     Date of Initial Endocrinology Visit: 09/06/2019     PATIENT IDENTIFIER: Betty Nguyen is a 79 y.o. female with a past medical history of T2DM, Asthma, Hx of Breast Ca, Hx of Whipple procedure. The patient presented for initial endocrinology clinic visit on 09/06/2019 for consultative assistance with her diabetes management.    HPI: Betty Nguyen was    Diagnosed with DM after whipple's procedure years ago ( due to pancreatic cysts)  Prior Medications tried/Intolerance: Synjardy - weight loss  Currently checking blood sugars multiple times a day through CGM  Hypoglycemia episodes : Yes     Symptoms :   Yes       Frequency:    rare  Hemoglobin A1c has ranged from 7.3% in 2019, peaking at 9.6% in 2018. Patient required assistance for hypoglycemia: no Patient has required hospitalization within the last 1 year from hyper or hypoglycemia: no  In terms of diet, the patient eats 3 meals a day , avoids snacking . Avoids sugar-sweetened beverages  She exercises  Has fear of hypoglycemia    HOME DIABETES REGIMEN: Toujeo 9- 12 units daily - she would not take it until BG > 200 mg/dL  Humalog 3 -4 units TID QAC    Statin: No  ACE-I/ARB: No     CONTINUOUS GLUCOSE MONITORING RECORD INTERPRETATION    Dates of Recording: 4/28- 09/06/2019  Sensor description:freestyle libre   Results statistics:   CGM use % of time 98  Average and SD 202/24.6  Time in range      37  %  % Time Above 180 45  % Time above 250 18  % Time Below target 0     Glycemic patterns summary: Hyperglycemia through the day   Hyperglycemic episodes  Prandial   Hypoglycemic episodes occurred N/A  Overnight periods: trends down     DIABETIC COMPLICATIONS: Microvascular complications:    Denies: CKD, retinopathy,  neuropathy  Last eye exam: Completed 08/2018  Macrovascular complications:    Denies: CAD, PVD, CVA   PAST HISTORY: Past Medical History:  Past Medical History:  Diagnosis Date  . Abnormal levels of other serum enzymes   . Anemia, unspecified   . Anxiety   . Anxiety and depression   . Anxiety disorder, unspecified   . Asthma   . Breast carcinoma ([place]) 08/2009   Invasive ductal; left; lumpectomy and sentinel node excision- oncology visits yearly.  . Chest pain   . Chronic obstructive pulmonary disease with (acute) exacerbation ([place])   . Depression   . Diabetes mellitus type II, controlled ([place])    diet controlled,exercise, no meds  . Diverticulitis 2012   Treated medically  . Exocrine pancreatic insufficiency   . Gastroesophageal reflux   . Hyperlipidemia   . Hyperlipidemia, unspecified   . Incisional hernia without obstruction or gangrene   . Lumbago with sciatica, unspecified side   . Major depressive disorder, single episode, unspecified   . Malignant neoplasm of unspecified site of unspecified female breast ([place])   . Menopause   . Mild persistent asthma with (acute) exacerbation   . Mild persistent asthma, uncomplicated   . Nephrolithiasis   . Other acariasis   . Other allergy, subsequent encounter   . Other specified diseases of pancreas   .  Tick bite    [place] Tick, alpha gal positive, makes her allergic to meat  . Type 2 diabetes mellitus without complications ([place])   . Uterine prolapse    Past Surgical History:  Past Surgical History:  Procedure Laterality Date  . BREAST LUMPECTOMY     Left  . CESAREAN SECTION    . CHOLECYSTECTOMY    . COLONOSCOPY  2004  . DILATION AND CURETTAGE OF UTERUS    . EUS N/A 09/07/2014   Procedure: UPPER ENDOSCOPIC ULTRASOUND (EUS) LINEAR;  Surgeon: Milus Banister, MD;  Location: [facility];  Service: Endoscopy;  Laterality: N/A;  . HERNIA REPAIR    . NEPHROLITHOTOMY    . TUBAL LIGATION    . WHIPPLE PROCEDURE         Social History:  reports that she has never smoked. She has never used smokeless tobacco. She reports that she does not drink alcohol or use drugs. Family History:  Family History  Problem Relation Age of Onset  . Coronary artery disease Mother   . Stroke Father   . Diabetes Mellitus II Father   . Colon cancer Neg Hx      HOME MEDICATIONS: Allergies as of 09/06/2019      Reactions   Amoxicillin    Biaxin [clarithromycin] Other (See Comments)   Hallucinations   Carafate [sucralfate] Other (See Comments)   Flared up her vertigo, run her blood sugar up and caused constipation   Codeine Other (See Comments)   Hallucinations    Doxycycline Other (See Comments)   GI upset , turned stool orange   Meat [alpha-gal]    RED MEAT    Metronidazole Other (See Comments)   Heart Palpations -flagyl   Cephalosporins Hives, Palpitations      Medication List       Accurate as of Sep 06, 2019  2:07 PM. If you have any questions, ask your nurse or doctor.        albuterol 108 (90 Base) MCG/ACT inhaler Commonly known as: VENTOLIN HFA Inhale 2 puffs into the lungs every 6 (six) hours as needed for wheezing.   azithromycin 250 MG tablet Commonly known as: ZITHROMAX Take 2 po today then 1 po 2-5   Breo Ellipta 200-25 MCG/INH Aepb Generic drug: fluticasone furoate-vilanterol Inhale 1 puff into the lungs daily.   dicyclomine 10 MG capsule Commonly known as: BENTYL Take 1 tablet once or twice daily as needed for abdominal pain and gas. What changed:   how much to take  how to take this  when to take this  reasons to take this  additional instructions   FreeStyle Libre Reader [place] by Does not apply route.   FreeStyle [employer] Use one sensor every 10 days.   FREESTYLE LITE test strip Generic drug: glucose blood Use as instructed   Insulin Glargine 300 UNIT/ML Sopn Commonly known as: Toujeo Max SoloStar Inject 12 Units into the skin at bedtime. What  changed:   how much to take  when to take this  reasons to take this   insulin lispro 100 UNIT/ML injection Commonly known as: HUMALOG Inject into the skin 3 (three) times daily before meals. 3-4 units with meals   omeprazole 20 MG capsule Commonly known as: PRILOSEC Take 1 capsule (20 mg total) by mouth daily.   ONE-DAILY MULTI-VITAMIN PO Take by mouth.   Pancrelipase (Lip-Prot-Amyl) 24000-76000 units Cpep Commonly known as: Creon TAKE 1 CAPSULE WITH MEALS AND SNACKS UP TO 6  A DAY What changed:   how much to take  how to take this  when to take this   valACYclovir 500 MG tablet Commonly known as: VALTREX Take 500 mg by mouth daily as needed (outbreak).        ALLERGIES: Allergies  Allergen Reactions  . Amoxicillin   . Biaxin [Clarithromycin] Other (See Comments)    Hallucinations  . Carafate [Sucralfate] Other (See Comments)    Flared up her vertigo, run her blood sugar up and caused constipation  . Codeine Other (See Comments)    Hallucinations   . Doxycycline Other (See Comments)    GI upset , turned stool orange  . Meat [Alpha-Gal]     RED MEAT   . Metronidazole Other (See Comments)    Heart Palpations -flagyl  . Cephalosporins Hives and Palpitations     REVIEW OF SYSTEMS: A comprehensive ROS was conducted with the patient and is negative except as per HPI    OBJECTIVE:   VITAL SIGNS: BP 122/62 (BP Location: Right Arm, Patient Position: Sitting, Cuff Size: Normal)   Pulse 79   Temp 98.8 F (37.1 C)   Ht 5' (1.524 m)   Wt 151 lb 12.8 oz (68.9 kg)   SpO2 98%   BMI 29.65 kg/m    PHYSICAL EXAM:  General: Pt appears well and is in NAD  HEENT:  Eyes: External eye exam normal without stare, lid lag or exophthalmos.  EOM intact.  PERRL.  Neck: General: Supple without adenopathy or carotid bruits. Thyroid: Thyroid size normal.  No goiter or nodules appreciated. No thyroid bruit.  Lungs: Clear with good BS bilat with no rales, rhonchi, or  wheezes  Heart: RRR with normal S1 and S2 and no gallops; no murmurs; no rub  Abdomen: Normoactive bowel sounds, soft, nontender, without masses or organomegaly palpable  Extremities:  Lower extremities - No pretibial edema. No lesions.  Skin: Normal texture and temperature to palpation. No rash noted. No Acanthosis nigricans/skin tags. No lipohypertrophy.  Neuro: MS is good with appropriate affect, pt is alert and Ox3    DM foot exam: 09/06/2019  The skin of the feet is without sores or ulcerations. The pedal pulses are 1+ on right and 1+ on left. The sensation is intact to a screening 5.07, 10 gram monofilament bilaterally   DATA REVIEWED:  Lab Results  Component Value Date   HGBA1C 8.2 (A) 09/06/2019   HGBA1C 8.3 10/08/2018   HGBA1C 8.8 (H) 01/27/2018   Lab Results  Component Value Date   MICROALBUR 0.7 01/27/2018   LDLCALC 58 04/17/2017   CREATININE 0.59 05/04/2018   Lab Results  Component Value Date   MICRALBCREAT 19 01/27/2018    Lab Results  Component Value Date   CHOL 140 04/17/2017   HDL 69 04/17/2017   LDLCALC 58 04/17/2017   TRIG 58 04/17/2017   CHOLHDL 2.0 04/17/2017        ASSESSMENT / PLAN / RECOMMENDATIONS:   1) Type 2 Diabetes Mellitus, Poorly controlled, Without complications - Most recent A1c of  8.2 %. Goal A1c < 7.0 %.    Plan: GENERAL:   Diabetes following a partial pancreatectomy per pt  I have discussed with the patient the pathophysiology of diabetes. We went over the natural progression of the disease. We talked about both insulin resistance and insulin deficiency. We stressed the importance of lifestyle changes including diet and exercise. I explained the complications associated with diabetes including retinopathy, nephropathy, neuropathy as well as  increased risk of cardiovascular disease. We went over the benefit seen with glycemic control.    I explained to the patient that diabetic patients are at higher than normal risk for  amputations.   Pt with severe fear of hypoglycemia which is a barrier at this time in reaching optimal glucose levels  Will make the following adjustments as below   MEDICATIONS:  Decrease Toujeo to 8 units daily   Humalog 3 units with Breakfast, 4 units with Lunch and Dinner     EDUCATION / INSTRUCTIONS:  BG monitoring instructions: Patient is instructed to check her blood sugars 4 times a day, before meals and bedtime .  Call [facility] if: BG persistently < 70 or > 300. . I reviewed the Rule of 15 for the treatment of hypoglycemia in detail with the patient. Literature supplied.   2) Diabetic complications:   Eye: Does not have known diabetic retinopathy.   Neuro/ Feet: Does not have known diabetic peripheral neuropathy.  Renal: Patient does not have known baseline CKD. She is not on an ACEI/ARB at present.     F/U in 8 weeks    I spent 45 minutes preparing to see the patient by review of recent labs, imaging and procedures, obtaining and reviewing separately obtained history, communicating with the patient, ordering medications, tests or procedures, and documenting clinical information in the EHR including the differential Dx, treatment, and any further evaluation and other management       Signed electronically by: Mack Guise, MD  [facility]  [facility] [address] [place], [place] [address] Phone: [phone] FAX: [phone]   CC: Maryruth Hancock, [place] [address] Phone: [phone]  Fax: [phone]    Return to Endocrinology clinic as below: No future appointments.

## 2019-09-06 NOTE — Patient Instructions (Signed)
-   Decrease Toujeo to 8 units daily  - Humalog 3 units with Breakfast, 4 units with Lunch and Supper      HOW TO TREAT LOW BLOOD SUGARS (Blood sugar LESS THAN 70 MG/DL)  Please follow the RULE OF 15 for the treatment of hypoglycemia treatment (when your (blood sugars are less than 70 mg/dL)    STEP 1: Take 15 grams of carbohydrates when your blood sugar is low, which includes:   3-4 GLUCOSE TABS  OR  3-4 OZ OF JUICE OR REGULAR SODA OR  ONE TUBE OF GLUCOSE GEL     STEP 2: RECHECK blood sugar in 15 MINUTES STEP 3: If your blood sugar is still low at the 15 minute recheck --> then, go back to STEP 1 and treat AGAIN with another 15 grams of carbohydrates.

## 2019-10-05 DIAGNOSIS — B009 Herpesviral infection, unspecified: Secondary | ICD-10-CM | POA: Diagnosis not present

## 2019-10-05 DIAGNOSIS — C50919 Malignant neoplasm of unspecified site of unspecified female breast: Secondary | ICD-10-CM | POA: Diagnosis not present

## 2019-10-05 DIAGNOSIS — E1065 Type 1 diabetes mellitus with hyperglycemia: Secondary | ICD-10-CM | POA: Diagnosis not present

## 2019-10-05 DIAGNOSIS — J449 Chronic obstructive pulmonary disease, unspecified: Secondary | ICD-10-CM | POA: Diagnosis not present

## 2019-10-05 DIAGNOSIS — K219 Gastro-esophageal reflux disease without esophagitis: Secondary | ICD-10-CM | POA: Diagnosis not present

## 2019-10-05 DIAGNOSIS — K869 Disease of pancreas, unspecified: Secondary | ICD-10-CM | POA: Diagnosis not present

## 2019-10-19 ENCOUNTER — Ambulatory Visit: Payer: Medicare Other | Admitting: Family Medicine

## 2019-10-26 ENCOUNTER — Ambulatory Visit: Payer: Medicare Other | Admitting: Family Medicine

## 2019-11-04 ENCOUNTER — Encounter: Payer: Self-pay | Admitting: Internal Medicine

## 2019-11-04 ENCOUNTER — Ambulatory Visit: Payer: PPO | Admitting: Internal Medicine

## 2019-11-04 ENCOUNTER — Other Ambulatory Visit: Payer: Self-pay

## 2019-11-04 VITALS — BP 118/64 | HR 81 | Ht 60.0 in | Wt 148.2 lb

## 2019-11-04 DIAGNOSIS — E119 Type 2 diabetes mellitus without complications: Secondary | ICD-10-CM

## 2019-11-04 DIAGNOSIS — E1165 Type 2 diabetes mellitus with hyperglycemia: Secondary | ICD-10-CM

## 2019-11-04 DIAGNOSIS — Z794 Long term (current) use of insulin: Secondary | ICD-10-CM

## 2019-11-04 MED ORDER — FREESTYLE LIBRE 14 DAY SENSOR MISC: 1.0000 | 3 refills | Status: DC

## 2019-11-04 MED ORDER — INSULIN LISPRO (1 UNIT DIAL) 100 UNIT/ML (KWIKPEN): PEN_INJECTOR | SUBCUTANEOUS | 11 refills | Status: DC

## 2019-11-04 MED ORDER — FREESTYLE LIBRE SENSOR SYSTEM MISC: 2 refills | Status: DC

## 2019-11-04 NOTE — Patient Instructions (Addendum)
-   Continue Toujeo 8 units daily  - Humalog 3 units with Breakfast, 4 units with Lunch and Supper      HOW TO TREAT LOW BLOOD SUGARS (Blood sugar LESS THAN 70 MG/DL)  Please follow the RULE OF 15 for the treatment of hypoglycemia treatment (when your (blood sugars are less than 70 mg/dL)    STEP 1: Take 15 grams of carbohydrates when your blood sugar is low, which includes:   3-4 GLUCOSE TABS  OR  3-4 OZ OF JUICE OR REGULAR SODA OR  ONE TUBE OF GLUCOSE GEL     STEP 2: RECHECK blood sugar in 15 MINUTES STEP 3: If your blood sugar is still low at the 15 minute recheck --> then, go back to STEP 1 and treat AGAIN with another 15 grams of carbohydrates.

## 2019-11-04 NOTE — Progress Notes (Signed)
Name: Betty Nguyen  Age/ Sex: 79 y.o., female   MRN/ DOB: [id number], 16-Jun-1940     PCP: Maryruth Hancock, MD   Reason for Endocrinology Evaluation: Type 2 Diabetes Mellitus  Initial Endocrine Consultative Visit: 09/06/2019    Betty Nguyen IDENTIFIER: Betty Nguyen is a 79 y.o. female with a past medical history of T2DM, Asthma, Hx of breast Ca , Hx of whipples procedure. The Betty Nguyen has followed with Endocrinology clinic since 09/06/2019  for consultative assistance with management of her diabetes.  DIABETIC HISTORY:  Betty Nguyen was diagnosed with DMafter whipple's procedure years ago ( due to pancreatic cysts). Synjardy - weight loss .  hemoglobin A1c has ranged from  7.3% in 2019, peaking at 9.6% in 2018.   SUBJECTIVE:   During the last visit (09/06/2019): A1c 8.2% , adjusted MDI regimen   Today (11/04/2019): Betty Nguyen  Betty Nguyen checks her blood sugars multiple times a day through CGM   The Betty Nguyen has not had hypoglycemic episodes since the last clinic visit.  HOME DIABETES REGIMEN:  Toujeo 8 units daily  Humalog 3/4/4 units        CONTINUOUS GLUCOSE MONITORING RECORD INTERPRETATION    Dates of Recording: 6/26-11/04/2019  Sensor description:freestyke libre  Results statistics:   CGM use % of time 99  Average and SD 195/26.9  Time in range     43   %  % Time Above 180 39  % Time above 250 18  % Time Below target 0    Glycemic patterns summary: postprandial hypglycemia  Hyperglycemic episodes  During the day  Hypoglycemic episodes occurred N/A  Overnight periods: trends down     DIABETIC COMPLICATIONS: Microvascular complications:    Denies: CKD, retinopathy, neuropathy  Last eye exam: Completed 08/2018  Macrovascular complications:    Denies: CAD, PVD, CVA   HISTORY:  Past Medical History:  Past Medical History:  Diagnosis Date  . Abnormal levels of other serum enzymes   . Anemia, unspecified   . Anxiety   . Anxiety and depression   . Anxiety  disorder, unspecified   . Asthma   . Breast carcinoma ([place]) 08/2009   Invasive ductal; left; lumpectomy and sentinel node excision- oncology visits yearly.  . Chest pain   . Chronic obstructive pulmonary disease with (acute) exacerbation ([place])   . Depression   . Diabetes mellitus type II, controlled ([place])    diet controlled,exercise, no meds  . Diverticulitis 2012   Treated medically  . Exocrine pancreatic insufficiency   . Gastroesophageal reflux   . Hyperlipidemia   . Hyperlipidemia, unspecified   . Incisional hernia without obstruction or gangrene   . Lumbago with sciatica, unspecified side   . Major depressive disorder, single episode, unspecified   . Malignant neoplasm of unspecified site of unspecified female breast ([place])   . Menopause   . Mild persistent asthma with (acute) exacerbation   . Mild persistent asthma, uncomplicated   . Nephrolithiasis   . Other acariasis   . Other allergy, subsequent encounter   . Other specified diseases of pancreas   . Tick bite    [place] Tick, alpha gal positive, makes her allergic to meat  . Type 2 diabetes mellitus without complications ([place])   . Uterine prolapse    Past Surgical History:  Past Surgical History:  Procedure Laterality Date  . BREAST LUMPECTOMY     Left  . CESAREAN SECTION    . CHOLECYSTECTOMY    . COLONOSCOPY  2004  .  DILATION AND CURETTAGE OF UTERUS    . EUS N/A 09/07/2014   Procedure: UPPER ENDOSCOPIC ULTRASOUND (EUS) LINEAR;  Surgeon: Milus Banister, MD;  Location: [facility];  Service: Endoscopy;  Laterality: N/A;  . HERNIA REPAIR    . NEPHROLITHOTOMY    . TUBAL LIGATION    . WHIPPLE PROCEDURE      Social History:  reports that Betty Nguyen has never smoked. Betty Nguyen has never used smokeless tobacco. Betty Nguyen reports that Betty Nguyen does not drink alcohol and does not use drugs. Family History:  Family History  Problem Relation Age of Onset  . Coronary artery disease Mother   . Stroke Father   . Diabetes Mellitus II Father    . Colon cancer Neg Hx      HOME MEDICATIONS: Allergies as of 11/04/2019      Reactions   Amoxicillin    Biaxin [clarithromycin] Other (See Comments)   Hallucinations   Carafate [sucralfate] Other (See Comments)   Flared up her vertigo, run her blood sugar up and caused constipation   Codeine Other (See Comments)   Hallucinations    Doxycycline Other (See Comments)   GI upset , turned stool orange   Meat [alpha-gal]    RED MEAT    Metronidazole Other (See Comments)   Heart Palpations -flagyl   Cephalosporins Hives, Palpitations      Medication List       Accurate as of November 04, 2019  2:39 PM. If you have any questions, ask your nurse or doctor.        albuterol 108 (90 Base) MCG/ACT inhaler Commonly known as: VENTOLIN HFA Inhale 2 puffs into the lungs every 6 (six) hours as needed for wheezing.   azithromycin 250 MG tablet Commonly known as: ZITHROMAX Take 2 po today then 1 po 2-5   Breo Ellipta 200-25 MCG/INH Aepb Generic drug: fluticasone furoate-vilanterol Inhale 1 puff into the lungs daily.   dicyclomine 10 MG capsule Commonly known as: BENTYL Take 1 tablet once or twice daily as needed for abdominal pain and gas. What changed:   how much to take  how to take this  when to take this  reasons to take this  additional instructions   FreeStyle Libre Reader [place] by Does not apply route.   FreeStyle [employer] Use one sensor every 10 days.   FREESTYLE LITE test strip Generic drug: glucose blood Use as instructed   HEMP OIL-VANILLYL BUTYL ETHER EX Apply topically.   insulin lispro 100 UNIT/ML injection Commonly known as: HUMALOG Inject into the skin 3 (three) times daily before meals. 3-4 units with meals   omeprazole 20 MG capsule Commonly known as: PRILOSEC Take 1 capsule (20 mg total) by mouth daily.   ONE-DAILY MULTI-VITAMIN PO Take by mouth.   Pancrelipase (Lip-Prot-Amyl) 24000-76000 units Cpep Commonly known as:  Creon TAKE 1 CAPSULE WITH MEALS AND SNACKS UP TO 6 A DAY What changed:   how much to take  how to take this  when to take this   Toujeo Max SoloStar 300 UNIT/ML Solostar Pen Generic drug: insulin glargine (2 Unit Dial) Inject 8 Units into the skin daily.   valACYclovir 500 MG tablet Commonly known as: VALTREX Take 500 mg by mouth daily as needed (outbreak).        OBJECTIVE:   Vital Signs: BP 118/64 (BP Location: Right Arm, Betty Nguyen Position: Sitting, Cuff Size: Normal)   Pulse 81   Ht 5' (1.524 m)   Wt 148 lb  3.2 oz (67.2 kg)   SpO2 98%   BMI 28.94 kg/m   Wt Readings from Last 3 Encounters:  11/04/19 148 lb 3.2 oz (67.2 kg)  09/06/19 151 lb 12.8 oz (68.9 kg)  05/16/19 150 lb (68 kg)     Exam: General: Betty Nguyen appears well and is in NAD  Neck: General: Supple without adenopathy. Thyroid: Thyroid size normal.  No goiter or nodules appreciated. No thyroid bruit.  Lungs: Clear with good BS bilat with no rales, rhonchi, or wheezes  Heart: RRR with normal S1 and S2 and no gallops; no murmurs; no rub  Abdomen: Normoactive bowel sounds, soft, nontender, without masses or organomegaly palpable  Extremities: No pretibial edema.   Neuro: MS is good with appropriate affect, Betty Nguyen is alert and Ox3    DM foot exam: 09/06/2019  The skin of the feet is without sores or ulcerations. The pedal pulses are 1+ on right and 1+ on left. The sensation is intact to a screening 5.07, 10 gram monofilament bilaterally   DATA REVIEWED:  Lab Results  Component Value Date   HGBA1C 8.2 (A) 09/06/2019   HGBA1C 8.3 10/08/2018   HGBA1C 8.8 (H) 01/27/2018   Lab Results  Component Value Date   MICROALBUR 0.7 01/27/2018   LDLCALC 58 04/17/2017   CREATININE 0.59 05/04/2018   Lab Results  Component Value Date   MICRALBCREAT 19 01/27/2018     Lab Results  Component Value Date   CHOL 140 04/17/2017   HDL 69 04/17/2017   LDLCALC 58 04/17/2017   TRIG 58 04/17/2017   CHOLHDL 2.0  04/17/2017         ASSESSMENT / PLAN / RECOMMENDATIONS:   1) Insulin-Dependant Diabetes Mellitus, Sub-optimally controlled, Without complications - Most recent A1c of 8.2 %. Goal A1c < 7.0 %.    - Slight improvement in glycemic control , this is a difficult situation but will do my best. Betty Nguyen has severe fear of hypoglycemia, Betty Nguyen perceives BG's less than 200 at bedtime as "low"  . Betty Nguyen will drink regular soda at bedtime with BG's 150 and 170 mg/dL which results in hyperglycemia and Betty Nguyen ends up giving herself more basal insulin which increases her actual risk of hypoglycemia over night.  - We did discussed  pharmacokinetics of short/basal inslulin with the Betty Nguyen again during this visit.  - Betty Nguyen advised NOT to increase basal insulin.  - Will continue education    MEDICATIONS: - Continue Toujeo 8 units daily  - Humalog 3 units with Breakfast, 4 units with Lunch and Supper  EDUCATION / INSTRUCTIONS:  BG monitoring instructions: Betty Nguyen is instructed to check her blood sugars 4 times a day, before meals and bedtime .  Call [place] [facility] if: BG persistently < 70 . I reviewed the Rule of 15 for the treatment of hypoglycemia in detail with the Betty Nguyen. Literature supplied.    2) Diabetic complications:   Eye: Does not have known diabetic retinopathy.   Neuro/ Feet: Does not have known diabetic peripheral neuropathy.  Renal: Betty Nguyen does not have known baseline CKD. Betty Nguyen is not on an ACEI/ARB at present.   F/U in 3 months    Signed electronically by: Mack Guise, MD  [facility] Endocrinology  [facility] [address] [place],  [address] Phone: [phone] FAX: [phone]   CC: Maryruth Hancock, [place] [address] Phone: [phone]  Fax: [phone]  Return to Endocrinology clinic as below: Future Appointments  Date Time Provider [place]  11/04/2019  2:40 PM Holley Wirt, Melanie Crazier, MD  [facility] None

## 2019-11-14 DIAGNOSIS — K219 Gastro-esophageal reflux disease without esophagitis: Secondary | ICD-10-CM | POA: Diagnosis not present

## 2019-11-14 DIAGNOSIS — E119 Type 2 diabetes mellitus without complications: Secondary | ICD-10-CM | POA: Diagnosis not present

## 2019-11-14 DIAGNOSIS — Z79899 Other long term (current) drug therapy: Secondary | ICD-10-CM | POA: Diagnosis not present

## 2019-11-14 DIAGNOSIS — Z6827 Body mass index (BMI) 27.0-27.9, adult: Secondary | ICD-10-CM | POA: Diagnosis not present

## 2019-11-14 DIAGNOSIS — H6121 Impacted cerumen, right ear: Secondary | ICD-10-CM | POA: Diagnosis not present

## 2019-11-14 DIAGNOSIS — M130 Polyarthritis, unspecified: Secondary | ICD-10-CM | POA: Diagnosis not present

## 2019-11-14 DIAGNOSIS — Z794 Long term (current) use of insulin: Secondary | ICD-10-CM | POA: Diagnosis not present

## 2019-11-14 DIAGNOSIS — R1084 Generalized abdominal pain: Secondary | ICD-10-CM | POA: Diagnosis not present

## 2019-11-14 DIAGNOSIS — Z0001 Encounter for general adult medical examination with abnormal findings: Secondary | ICD-10-CM | POA: Diagnosis not present

## 2019-11-14 DIAGNOSIS — J449 Chronic obstructive pulmonary disease, unspecified: Secondary | ICD-10-CM | POA: Diagnosis not present

## 2019-11-14 DIAGNOSIS — E668 Other obesity: Secondary | ICD-10-CM | POA: Diagnosis not present

## 2019-12-08 DIAGNOSIS — Z79899 Other long term (current) drug therapy: Secondary | ICD-10-CM | POA: Diagnosis not present

## 2019-12-08 DIAGNOSIS — E119 Type 2 diabetes mellitus without complications: Secondary | ICD-10-CM | POA: Diagnosis not present

## 2019-12-08 DIAGNOSIS — M130 Polyarthritis, unspecified: Secondary | ICD-10-CM | POA: Diagnosis not present

## 2019-12-13 DIAGNOSIS — N390 Urinary tract infection, site not specified: Secondary | ICD-10-CM | POA: Diagnosis not present

## 2019-12-13 DIAGNOSIS — E119 Type 2 diabetes mellitus without complications: Secondary | ICD-10-CM | POA: Diagnosis not present

## 2019-12-13 DIAGNOSIS — E559 Vitamin D deficiency, unspecified: Secondary | ICD-10-CM | POA: Diagnosis not present

## 2019-12-13 DIAGNOSIS — Z794 Long term (current) use of insulin: Secondary | ICD-10-CM | POA: Diagnosis not present

## 2019-12-22 ENCOUNTER — Telehealth: Payer: Self-pay | Admitting: Internal Medicine

## 2019-12-22 NOTE — Telephone Encounter (Signed)
Please ask her to increase toujeo to 10 units. Continue humalog same dose , let see if her sugars improve in 4 days. Please let her know that insulin works but she is just NOT on enough because she is always scared of low sugars

## 2019-12-22 NOTE — Telephone Encounter (Signed)
Called pt and gave her MD message. Pt verbalized understanding and she did not voice any additional questions or concerns.

## 2019-12-22 NOTE — Telephone Encounter (Signed)
Please advise 

## 2019-12-22 NOTE — Telephone Encounter (Signed)
Patient called asking to speak to nurse because her sugars have been pretty high - yesterday it was at 470 and today its at 299. She stated that she started taking an antibiotic for her UTI and does not know if this could be the cause? She also says she doesn't think her new insulin is working for her. Please advise. Ph# [phone]

## 2019-12-29 DIAGNOSIS — E119 Type 2 diabetes mellitus without complications: Secondary | ICD-10-CM | POA: Diagnosis not present

## 2019-12-29 DIAGNOSIS — N390 Urinary tract infection, site not specified: Secondary | ICD-10-CM | POA: Diagnosis not present

## 2020-01-03 DIAGNOSIS — E1165 Type 2 diabetes mellitus with hyperglycemia: Secondary | ICD-10-CM | POA: Diagnosis not present

## 2020-01-03 DIAGNOSIS — N3091 Cystitis, unspecified with hematuria: Secondary | ICD-10-CM | POA: Diagnosis not present

## 2020-01-03 DIAGNOSIS — Z794 Long term (current) use of insulin: Secondary | ICD-10-CM | POA: Diagnosis not present

## 2020-01-16 DIAGNOSIS — N3091 Cystitis, unspecified with hematuria: Secondary | ICD-10-CM | POA: Diagnosis not present

## 2020-02-10 ENCOUNTER — Other Ambulatory Visit: Payer: Self-pay

## 2020-02-10 ENCOUNTER — Ambulatory Visit: Payer: PPO | Admitting: Internal Medicine

## 2020-02-10 ENCOUNTER — Encounter: Payer: Self-pay | Admitting: Internal Medicine

## 2020-02-10 VITALS — BP 116/76 | HR 84 | Temp 97.2°F | Ht 60.0 in | Wt 140.1 lb

## 2020-02-10 DIAGNOSIS — E119 Type 2 diabetes mellitus without complications: Secondary | ICD-10-CM | POA: Diagnosis not present

## 2020-02-10 DIAGNOSIS — Z794 Long term (current) use of insulin: Secondary | ICD-10-CM | POA: Diagnosis not present

## 2020-02-10 DIAGNOSIS — E1165 Type 2 diabetes mellitus with hyperglycemia: Secondary | ICD-10-CM

## 2020-02-10 LAB — POCT GLYCOSYLATED HEMOGLOBIN (HGB A1C): Hemoglobin A1C: 7.9 % — AB (ref 4.0–5.6)

## 2020-02-10 NOTE — Patient Instructions (Signed)
-   keep up the Good Work !!! - Continue Toujeo 8 units daily  - Humalog 3 units with Breakfast, 4 units with Lunch and Supper      HOW TO TREAT LOW BLOOD SUGARS (Blood sugar LESS THAN 70 MG/DL)  Please follow the RULE OF 15 for the treatment of hypoglycemia treatment (when your (blood sugars are less than 70 mg/dL)    STEP 1: Take 15 grams of carbohydrates when your blood sugar is low, which includes:   3-4 GLUCOSE TABS  OR  3-4 OZ OF JUICE OR REGULAR SODA OR  ONE TUBE OF GLUCOSE GEL     STEP 2: RECHECK blood sugar in 15 MINUTES STEP 3: If your blood sugar is still low at the 15 minute recheck --> then, go back to STEP 1 and treat AGAIN with another 15 grams of carbohydrates.

## 2020-02-10 NOTE — Progress Notes (Signed)
Name: Betty Nguyen  Age/ Sex: 79 y.o., female   MRN/ DOB: [id number], 10-04-1940     PCP: Maryruth Hancock, MD   Reason for Endocrinology Evaluation: Type 2 Diabetes Mellitus  Initial Endocrine Consultative Visit: 09/06/2019    PATIENT IDENTIFIER: Betty Nguyen is a 79 y.o. female with a past medical history of T2DM, Asthma, Hx of breast Ca , Hx of whipples procedure. The patient has followed with Endocrinology clinic since 09/06/2019  for consultative assistance with management of her diabetes.  DIABETIC HISTORY:  Betty Nguyen was diagnosed with DM after whipple's procedure years ago ( due to pancreatic cysts). Synjardy - weight loss .  hemoglobin A1c has ranged from  7.3% in 2019, peaking at 9.6% in 2018.   SUBJECTIVE:   During the last visit (09/06/2019): A1c 8.2% , adjusted MDI regimen   Today (02/10/2020): Betty Nguyen  She checks her blood sugars multiple times a day through CGM   The patient has not had hypoglycemic episodes since the last clinic visit.      HOME DIABETES REGIMEN:  Toujeo 10 units daily - toujeo 8 units  Humalog 3/4/4 units        CONTINUOUS GLUCOSE MONITORING RECORD INTERPRETATION    Dates of Recording: 10/2-10/15/2021  Sensor description:freestyke libre  Results statistics:   CGM use % of time 96  Average and SD 189/28.6  Time in range     48  %  % Time Above 180 39  % Time above 250 13  % Time Below target 0    Glycemic patterns summary: postprandial hyperglycemia  Hyperglycemic episodes  During the day  Hypoglycemic episodes occurred N/A  Overnight periods: trends down     DIABETIC COMPLICATIONS: Microvascular complications:    Denies: CKD, retinopathy, neuropathy  Last eye exam: Completed 08/2018  Macrovascular complications:    Denies: CAD, PVD, CVA   HISTORY:  Past Medical History:  Past Medical History:  Diagnosis Date   Abnormal levels of other serum enzymes    Anemia, unspecified    Anxiety    Anxiety  and depression    Anxiety disorder, unspecified    Asthma    Breast carcinoma ([place]) 08/2009   Invasive ductal; left; lumpectomy and sentinel node excision- oncology visits yearly.   Chest pain    Chronic obstructive pulmonary disease with (acute) exacerbation (HCC)    Depression    Diabetes mellitus type II, controlled ([place])    diet controlled,exercise, no meds   Diverticulitis 2012   Treated medically   Exocrine pancreatic insufficiency    Gastroesophageal reflux    Hyperlipidemia    Hyperlipidemia, unspecified    Incisional hernia without obstruction or gangrene    Lumbago with sciatica, unspecified side    Major depressive disorder, single episode, unspecified    Malignant neoplasm of unspecified site of unspecified female breast ([place])    Menopause    Mild persistent asthma with (acute) exacerbation    Mild persistent asthma, uncomplicated    Nephrolithiasis    Other acariasis    Other allergy, subsequent encounter    Other specified diseases of pancreas    Tick bite    [place] Tick, alpha gal positive, makes her allergic to meat   Type 2 diabetes mellitus without complications ([place])    Uterine prolapse    Past Surgical History:  Past Surgical History:  Procedure Laterality Date   BREAST LUMPECTOMY     Left   CESAREAN SECTION  CHOLECYSTECTOMY     COLONOSCOPY  2004   DILATION AND CURETTAGE OF UTERUS     EUS N/A 09/07/2014   Procedure: UPPER ENDOSCOPIC ULTRASOUND (EUS) LINEAR;  Surgeon: Milus Banister, MD;  Location: [facility];  Service: Endoscopy;  Laterality: N/A;   HERNIA REPAIR     NEPHROLITHOTOMY     TUBAL LIGATION     WHIPPLE PROCEDURE      Social History:  reports that she has never smoked. She has never used smokeless tobacco. She reports that she does not drink alcohol and does not use drugs. Family History:  Family History  Problem Relation Age of Onset   Coronary artery disease Mother    Stroke Father     Diabetes Mellitus II Father    Colon cancer Neg Hx      HOME MEDICATIONS: Allergies as of 02/10/2020      Reactions   Amoxicillin    Biaxin [clarithromycin] Other (See Comments)   Hallucinations   Carafate [sucralfate] Other (See Comments)   Flared up her vertigo, run her blood sugar up and caused constipation   Codeine Other (See Comments)   Hallucinations    Doxycycline Other (See Comments)   GI upset , turned stool orange   Meat [alpha-gal]    RED MEAT    Metronidazole Other (See Comments)   Heart Palpations -flagyl   Cephalosporins Hives, Palpitations      Medication List       Accurate as of February 10, 2020 10:02 AM. If you have any questions, ask your nurse or doctor.        albuterol 108 (90 Base) MCG/ACT inhaler Commonly known as: VENTOLIN HFA Inhale 2 puffs into the lungs every 6 (six) hours as needed for wheezing.   Breo Ellipta 200-25 MCG/INH Aepb Generic drug: fluticasone furoate-vilanterol Inhale 1 puff into the lungs daily.   FreeStyle Libre 14 Day Sensor Misc 1 Device by Does not apply route as directed.   FreeStyle [employer] by Does not apply route.   FREESTYLE LITE test strip Generic drug: glucose blood Use as instructed   HEMP OIL-VANILLYL BUTYL ETHER EX Apply topically.   insulin lispro 100 UNIT/ML KwikPen Commonly known as: HUMALOG Inject 0.03 mLs (3 Units total) into the skin daily with breakfast AND 0.04 mLs (4 Units total) daily with lunch AND 0.04 mLs (4 Units total) daily with supper.   omeprazole 20 MG capsule Commonly known as: PRILOSEC Take 1 capsule (20 mg total) by mouth daily.   ONE-DAILY MULTI-VITAMIN PO Take by mouth.   Pancrelipase (Lip-Prot-Amyl) 24000-76000 units Cpep Commonly known as: Creon TAKE 1 CAPSULE WITH MEALS AND SNACKS UP TO 6 A DAY What changed:   how much to take  how to take this  when to take this   Toujeo Max SoloStar 300 UNIT/ML Solostar Pen Generic drug: insulin glargine (2 Unit  Dial) Inject 8 Units into the skin daily.   valACYclovir 500 MG tablet Commonly known as: VALTREX Take 500 mg by mouth daily as needed (outbreak).        OBJECTIVE:   Vital Signs: BP 116/76    Pulse 84    Temp (!) 97.2 F (36.2 C) (Temporal)    Ht 5' (1.524 m)    Wt 140 lb 2 oz (63.6 kg)    SpO2 97%    BMI 27.37 kg/m   Wt Readings from Last 3 Encounters:  02/10/20 140 lb 2 oz (63.6 kg)  11/04/19 148 lb 3.2  oz (67.2 kg)  09/06/19 151 lb 12.8 oz (68.9 kg)     Exam: General: Pt appears well and is in NAD  Neck: General: Supple without adenopathy. Thyroid: Thyroid size normal.  No goiter or nodules appreciated. No thyroid bruit.  Lungs: Clear with good BS bilat with no rales, rhonchi, or wheezes  Heart: RRR with normal S1 and S2 and no gallops; no murmurs; no rub  Abdomen: Normoactive bowel sounds, soft, nontender, without masses or organomegaly palpable  Extremities: No pretibial edema.   Neuro: MS is good with appropriate affect, pt is alert and Ox3    DM foot exam: 09/06/2019  The skin of the feet is without sores or ulcerations. The pedal pulses are 1+ on right and 1+ on left. The sensation is intact to a screening 5.07, 10 gram monofilament bilaterally   DATA REVIEWED:  Lab Results  Component Value Date   HGBA1C 7.9 (A) 02/10/2020   HGBA1C 8.2 (A) 09/06/2019   HGBA1C 8.3 10/08/2018   Lab Results  Component Value Date   MICROALBUR 0.7 01/27/2018   LDLCALC 58 04/17/2017   CREATININE 0.59 05/04/2018   Lab Results  Component Value Date   MICRALBCREAT 19 01/27/2018     Lab Results  Component Value Date   CHOL 140 04/17/2017   HDL 69 04/17/2017   LDLCALC 58 04/17/2017   TRIG 58 04/17/2017   CHOLHDL 2.0 04/17/2017         ASSESSMENT / PLAN / RECOMMENDATIONS:   1) Insulin-Dependant Diabetes Mellitus, Sub-optimally controlled, Without complications - Most recent A1c of 7.9 %. Goal A1c < 7.5 %.    - Slight improvement in glycemic control . Pt has  severe fear of hypoglycemia, she perceives BG's less than 200 at bedtime as "low"  . She will drink regular soda at bedtime with BG's 150 and 170 mg/dL which results in hyperglycemia and she ends up giving herself more basal insulin which increases her actual risk of hypoglycemia over night.    MEDICATIONS: - Continue Toujeo 8 units daily  - Humalog 3 units with Breakfast, 4 units with Lunch and Supper  EDUCATION / INSTRUCTIONS:  BG monitoring instructions: Patient is instructed to check her blood sugars 4 times a day, before meals and bedtime .  Call [facility] if: BG persistently < 70  I reviewed the Rule of 15 for the treatment of hypoglycemia in detail with the patient. Literature supplied.    2) Diabetic complications:   Eye: Does not have known diabetic retinopathy.   Neuro/ Feet: Does not have known diabetic peripheral neuropathy.  Renal: Patient does not have known baseline CKD. She is not on an ACEI/ARB at present.   F/U in 3 months    Signed electronically by: Mack Guise, MD  [facility] Endocrin[facility]  [facility] [address] [place], [place] [address] Phone: [phone] FAX: [phone]   CC: Maryruth Hancock, [place] [address] Phone: [phone]  Fax: [phone]  Return to Endocrinology clinic as below: No future appointments.

## 2020-02-14 DIAGNOSIS — Z23 Encounter for immunization: Secondary | ICD-10-CM | POA: Diagnosis not present

## 2020-02-14 DIAGNOSIS — N898 Other specified noninflammatory disorders of vagina: Secondary | ICD-10-CM | POA: Diagnosis not present

## 2020-03-07 DIAGNOSIS — H905 Unspecified sensorineural hearing loss: Secondary | ICD-10-CM | POA: Diagnosis not present

## 2020-03-13 DIAGNOSIS — M170 Bilateral primary osteoarthritis of knee: Secondary | ICD-10-CM | POA: Diagnosis not present

## 2020-03-21 DIAGNOSIS — H905 Unspecified sensorineural hearing loss: Secondary | ICD-10-CM | POA: Diagnosis not present

## 2020-05-18 ENCOUNTER — Telehealth: Payer: Self-pay | Admitting: Gastroenterology

## 2020-05-18 NOTE — Telephone Encounter (Signed)
Pt states that she has been having very bad heartburn when she goes to bed. She wants to know if there is anything that she can take at night. Pls call her.

## 2020-05-18 NOTE — Telephone Encounter (Signed)
The pt has recently began eating spicy foods and has developed heartburn at bedtime.  She has been advised to stop the spicy foods and take pepcid at bedtime. She has also been scheduled a follow up with Dr Ardis Hughs.  The pt has been advised of the information and verbalized understanding.

## 2020-05-23 ENCOUNTER — Telehealth: Payer: Self-pay | Admitting: Internal Medicine

## 2020-05-23 DIAGNOSIS — E1165 Type 2 diabetes mellitus with hyperglycemia: Secondary | ICD-10-CM

## 2020-05-23 MED ORDER — FREESTYLE LIBRE READER DEVI: 0 refills | Status: DC

## 2020-05-23 NOTE — Telephone Encounter (Signed)
Sent refill for the [facility] reader as requested.

## 2020-05-23 NOTE — Telephone Encounter (Signed)
[place], [place] Phone:  [phone]  Fax:  [phone]     Pt came to office because her meter broke, she is requesting a new prescription for her Freestyle Libre 14 day meter. Please send to pharmacy above.

## 2020-06-13 ENCOUNTER — Other Ambulatory Visit: Payer: Self-pay

## 2020-06-14 ENCOUNTER — Encounter: Payer: Self-pay | Admitting: Internal Medicine

## 2020-06-14 ENCOUNTER — Ambulatory Visit: Payer: PPO | Admitting: Internal Medicine

## 2020-06-14 VITALS — BP 124/74 | HR 86 | Ht 60.0 in | Wt 147.5 lb

## 2020-06-14 DIAGNOSIS — Z794 Long term (current) use of insulin: Secondary | ICD-10-CM

## 2020-06-14 DIAGNOSIS — E1165 Type 2 diabetes mellitus with hyperglycemia: Secondary | ICD-10-CM

## 2020-06-14 LAB — POCT GLYCOSYLATED HEMOGLOBIN (HGB A1C): Hemoglobin A1C: 8.1 % — AB (ref 4.0–5.6)

## 2020-06-14 MED ORDER — DEXCOM G6 TRANSMITTER MISC: 1.0000 | 3 refills | Status: DC

## 2020-06-14 MED ORDER — INSULIN LISPRO (1 UNIT DIAL) 100 UNIT/ML (KWIKPEN)
PEN_INJECTOR | SUBCUTANEOUS | 11 refills | Status: DC
Start: 2020-06-14 — End: 2020-10-18

## 2020-06-14 MED ORDER — DEXCOM G6 SENSOR MISC: 1.0000 | 3 refills | Status: DC

## 2020-06-14 NOTE — Patient Instructions (Addendum)
-   Decrease Toujeo 8 units daily  - Humalog 4 units with Breakfast, 5 units with Lunch and 5 units with Supper  -Humalog correctional insulin: ADD extra units on insulin to your meal-time Humalog dose if your blood sugars are higher than 215. Use the scale below to help guide you:   Blood sugar before meal Number of units to inject  Less than 215 0 unit  216 -  280 1 units  281 -  345 2 units  346 -  410 3 units  411 -  475 4 units  476 -  540 5 units       HOW TO TREAT LOW BLOOD SUGARS (Blood sugar LESS THAN 70 MG/DL)  Please follow the RULE OF 15 for the treatment of hypoglycemia treatment (when your (blood sugars are less than 70 mg/dL)    STEP 1: Take 15 grams of carbohydrates when your blood sugar is low, which includes:   3-4 GLUCOSE TABS  OR  3-4 OZ OF JUICE OR REGULAR SODA OR  ONE TUBE OF GLUCOSE GEL     STEP 2: RECHECK blood sugar in 15 MINUTES STEP 3: If your blood sugar is still low at the 15 minute recheck --> then, go back to STEP 1 and treat AGAIN with another 15 grams of carbohydrates.

## 2020-06-14 NOTE — Progress Notes (Signed)
Name: Betty Nguyen  Age/ Sex: 80 y.o., female   MRN/ DOB: [id number], 24-Dec-1940     PCP: Garald Braver, FNP   Reason for Endocrinology Evaluation: Type 2 Diabetes Mellitus  Initial Endocrine Consultative Visit: 09/06/2019    PATIENT IDENTIFIER: Betty Nguyen is a 80 y.o. female with a past medical history of T2DM, Asthma, Hx of breast Ca , Hx of whipples procedure. The patient has followed with Endocrinology clinic since 09/06/2019  for consultative assistance with management of her diabetes.  DIABETIC HISTORY:  Ms. Betty Nguyen was diagnosed with DM after whipple's procedure years ago ( due to pancreatic cysts). Synjardy - weight loss .  hemoglobin A1c has ranged from  7.3% in 2019, peaking at 9.6% in 2018.   SUBJECTIVE:     During the last visit (02/10/2020): A1c 7.9 % , adjusted MDI regimen   Today (06/14/2020): Ms. Betty Nguyen is here for a follow up on diabetes management. She is accompanied by her new husband Betty Nguyen.  She checks her blood sugars multiple times a day through CGM .   The patient has not had hypoglycemic episodes since the last clinic visit.   She would like to try the Dexcom due to 30 point discrepancy with [facility] DIABETES REGIMEN:  Toujeo 8 units daily - she is taking 10 units  Humalog 3/4/4 units - 4/5/4-5 with supper     CONTINUOUS GLUCOSE MONITORING RECORD INTERPRETATION    Dates of Recording: 2/4-2/17/2022  Sensor description:freestyke libre  Results statistics:   CGM use % of time 67  Average and SD 216/30.8  Time in range   35 %  % Time Above 180 35  % Time above 250 30  % Time Below target 0    Glycemic patterns summary: postprandial hyperglycemia  Hyperglycemic episodes  During the day  Hypoglycemic episodes occurred N/A  Overnight periods: trends down     DIABETIC COMPLICATIONS: Microvascular complications:    Denies: CKD, retinopathy, neuropathy  Last eye exam: Completed 08/2018  Macrovascular  complications:    Denies: CAD, PVD, CVA   HISTORY:  Past Medical History:  Past Medical History:  Diagnosis Date  . Abnormal levels of other serum enzymes   . Anemia, unspecified   . Anxiety   . Anxiety and depression   . Anxiety disorder, unspecified   . Asthma   . Breast carcinoma ([place]) 08/2009   Invasive ductal; left; lumpectomy and sentinel node excision- oncology visits yearly.  . Chest pain   . Chronic obstructive pulmonary disease with (acute) exacerbation ([place])   . Depression   . Diabetes mellitus type II, controlled ([place])    diet controlled,exercise, no meds  . Diverticulitis 2012   Treated medically  . Exocrine pancreatic insufficiency   . Gastroesophageal reflux   . Hyperlipidemia   . Hyperlipidemia, unspecified   . Incisional hernia without obstruction or gangrene   . Lumbago with sciatica, unspecified side   . Major depressive disorder, single episode, unspecified   . Malignant neoplasm of unspecified site of unspecified female breast ([place])   . Menopause   . Mild persistent asthma with (acute) exacerbation   . Mild persistent asthma, uncomplicated   . Nephrolithiasis   . Other acariasis   . Other allergy, subsequent encounter   . Other specified diseases of pancreas   . Tick bite    [place] Tick, alpha gal positive, makes her allergic to meat  . Type 2 diabetes mellitus  without complications ([place])   . Uterine prolapse    Past Surgical History:  Past Surgical History:  Procedure Laterality Date  . BREAST LUMPECTOMY     Left  . CESAREAN SECTION    . CHOLECYSTECTOMY    . COLONOSCOPY  2004  . DILATION AND CURETTAGE OF UTERUS    . EUS N/A 09/07/2014   Procedure: UPPER ENDOSCOPIC ULTRASOUND (EUS) LINEAR;  Surgeon: Milus Banister, MD;  Location: [facility];  Service: Endoscopy;  Laterality: N/A;  . HERNIA REPAIR    . NEPHROLITHOTOMY    . TUBAL LIGATION    . WHIPPLE PROCEDURE      Social History:  reports that she has never smoked. She has never  used smokeless tobacco. She reports that she does not drink alcohol and does not use drugs. Family History:  Family History  Problem Relation Age of Onset  . Coronary artery disease Mother   . Stroke Father   . Diabetes Mellitus II Father   . Colon cancer Neg Hx      HOME MEDICATIONS: Allergies as of 06/14/2020      Reactions   Amoxicillin    Biaxin [clarithromycin] Other (See Comments)   Hallucinations   Carafate [sucralfate] Other (See Comments)   Flared up her vertigo, run her blood sugar up and caused constipation   Codeine Other (See Comments)   Hallucinations    Doxycycline Other (See Comments)   GI upset , turned stool orange   Meat [alpha-gal]    RED MEAT    Metronidazole Other (See Comments)   Heart Palpations -flagyl   Cephalosporins Hives, Palpitations      Medication List       Accurate as of June 14, 2020 10:07 AM. If you have any questions, ask your nurse or doctor.        albuterol 108 (90 Base) MCG/ACT inhaler Commonly known as: VENTOLIN HFA Inhale 2 puffs into the lungs every 6 (six) hours as needed for wheezing.   fluticasone furoate-vilanterol 200-25 MCG/INH Aepb Commonly known as: BREO ELLIPTA Inhale 1 puff into the lungs daily.   FreeStyle Libre 14 Day Sensor Misc 1 Device by Does not apply route as directed.   FreeStyle [employer] Use as instructed to check blood sugar daily   FREESTYLE LITE test strip Generic drug: glucose blood Use as instructed   HEMP OIL-VANILLYL BUTYL ETHER EX Apply topically.   insulin lispro 100 UNIT/ML KwikPen Commonly known as: HUMALOG Inject 0.03 mLs (3 Units total) into the skin daily with breakfast AND 0.04 mLs (4 Units total) daily with lunch AND 0.04 mLs (4 Units total) daily with supper.   omeprazole 20 MG capsule Commonly known as: PRILOSEC Take 1 capsule (20 mg total) by mouth daily.   ONE-DAILY MULTI-VITAMIN PO Take by mouth.   Pancrelipase (Lip-Prot-Amyl) 24000-76000 units  Cpep Commonly known as: Creon TAKE 1 CAPSULE WITH MEALS AND SNACKS UP TO 6 A DAY What changed:   how much to take  how to take this  when to take this   Toujeo Max SoloStar 300 UNIT/ML Solostar Pen Generic drug: insulin glargine (2 Unit Dial) Inject 8 Units into the skin daily.   valACYclovir 500 MG tablet Commonly known as: VALTREX Take 500 mg by mouth daily as needed (outbreak).        OBJECTIVE:   Vital Signs: BP 124/74   Pulse 86   Ht 5' (1.524 m)   Wt 147 lb 8 oz (66.9 kg)  SpO2 98%   BMI 28.81 kg/m   Wt Readings from Last 3 Encounters:  06/14/20 147 lb 8 oz (66.9 kg)  02/10/20 140 lb 2 oz (63.6 kg)  11/04/19 148 lb 3.2 oz (67.2 kg)     Exam: General: Pt appears well and is in NAD  Neck: General: Supple without adenopathy. Thyroid: Thyroid size normal.  No goiter or nodules appreciated. No thyroid bruit.  Lungs: Clear with good BS bilat with no rales, rhonchi, or wheezes  Heart: RRR with normal S1 and S2 and no gallops; no murmurs; no rub  Abdomen: Normoactive bowel sounds, soft, nontender, without masses or organomegaly palpable  Extremities: No pretibial edema.   Neuro: MS is good with appropriate affect, pt is alert and Ox3    DM foot exam: 09/06/2019  The skin of the feet is without sores or ulcerations. The pedal pulses are 1+ on right and 1+ on left. The sensation is intact to a screening 5.07, 10 gram monofilament bilaterally   DATA REVIEWED:  Lab Results  Component Value Date   HGBA1C 8.1 (A) 06/14/2020   HGBA1C 7.9 (A) 02/10/2020   HGBA1C 8.2 (A) 09/06/2019   Lab Results  Component Value Date   MICROALBUR 0.7 01/27/2018   LDLCALC 58 04/17/2017   CREATININE 0.59 05/04/2018   Lab Results  Component Value Date   MICRALBCREAT 19 01/27/2018     Lab Results  Component Value Date   CHOL 140 04/17/2017   HDL 69 04/17/2017   LDLCALC 58 04/17/2017   TRIG 58 04/17/2017   CHOLHDL 2.0 04/17/2017         ASSESSMENT / PLAN /  RECOMMENDATIONS:   1) Insulin-Dependant Diabetes Mellitus, Poorly  controlled, Without complications - Most recent A1c of 8.1 %. Goal A1c < 7.5 %.    -  She has severe fear of hypoglycemia, she continues to  drink regular soda at bedtime with BG's 130 and 170 mg/dL which results in hyperglycemia and she ends up giving herself more basal insulin which increases her actual risk of hypoglycemia over night.  - She was advised against self increasing insulin , especially not the basal. We discussed taking basal insulin regardless of meals and to take prandial insulin only with meals - She is sensitive to CHO as well as insulin which makes glycemic control difficult. Part of this is the hx of whipple's procedure.  - She will be provided with a correction scale  - Dexcom sent   MEDICATIONS: - Decrease Toujeo 8 units daily  - Humalog 4 units with Breakfast, 5 units with Lunch and Supper - CF : Humalog ( BG -150/65)   EDUCATION / INSTRUCTIONS:  BG monitoring instructions: Patient is instructed to check her blood sugars 4 times a day, before meals and bedtime .  Call [place] [facility] if: BG persistently < 70 . I reviewed the Rule of 15 for the treatment of hypoglycemia in detail with the patient. Literature supplied.    2) Diabetic complications:   Eye: Does not have known diabetic retinopathy.   Neuro/ Feet: Does not have known diabetic peripheral neuropathy.  Renal: Patient does not have known baseline CKD. She is not on an ACEI/ARB at present.   F/U in 3 months    Signed electronically by: Mack Guise, MD  [facility] Endocrinology  [facility] [address] [place], [place] [address] Phone: [phone] FAX: [phone]   CC: Garald Braver, [address] [facility]  [place] [address] Phone: [phone]  Fax: [phone]  Return to Endocrinology clinic as below: Future Appointments  Date Time Provider [place]  06/14/2020 10:10 AM Nathaly Dawkins, Melanie Crazier, MD [facility] None  07/16/2020  9:50 AM Milus Banister, MD [facility]

## 2020-06-26 ENCOUNTER — Telehealth: Payer: Self-pay | Admitting: Internal Medicine

## 2020-06-26 DIAGNOSIS — E119 Type 2 diabetes mellitus without complications: Secondary | ICD-10-CM

## 2020-06-26 MED ORDER — FREESTYLE LANCETS MISC: 5 refills | Status: DC

## 2020-06-26 MED ORDER — FREESTYLE LITE TEST VI STRP: ORAL_STRIP | 5 refills | Status: DC

## 2020-06-26 NOTE — Telephone Encounter (Signed)
Patient requests to be called at ph# [phone] re: Patient has been low blood sugars for the last 2 days (lowest = 67). Patient states she may be taking too much medication.  Patient also requests the following RX:  MEDICATION: Freestyle Libre Test Strips AND Lancets  PHARMACY:   [place], [place] Phone:  [phone]  Fax:  [phone]       HAS THE PATIENT CONTACTED [facility]?  Yes  IS THIS A 90 DAY SUPPLY : Yes  IS PATIENT OUT OF MEDICATION: No-Patient requests Dr. Kelton Pillar send in new RX  IF NOT; HOW MUCH IS LEFT: Not sure  LAST APPOINTMENT DATE: @2 /17/2022  NEXT APPOINTMENT DATE:@6 /23/2022  DO WE HAVE YOUR PERMISSION TO LEAVE A DETAILED MESSAGE?: Yes  OTHER COMMENTS:    **Let patient know to contact pharmacy at the end of the day to make sure medication is ready. **  ** Please notify patient to allow 48-72 hours to process**  **Encourage patient to contact the pharmacy for refills or they can request refills through [facility]**

## 2020-06-26 NOTE — Telephone Encounter (Signed)
Patient stated on Sunday after lunch and injection, her blood sugar was 74. On Monday after supper about 3 hours later, her blood sugar was 67. However, this morning when check it after breakfast, her blood sugar was 200. I have advise patient that Dr Kelton Pillar is out of the office today and patient wanted wait for Dr Kelton Pillar to respond.  Refill as requested.

## 2020-06-27 NOTE — Telephone Encounter (Signed)
PLease make sure she has reduce the Toujeo to 8 units. She needs to continue Humalog with Breakfast at 4 units, 5 units with Lunch and reduce to 4 units with supper.    Please let her know that the reason her sugars go up and down is because she is not eating the same amount of starch with each meal. If she needs help with this, let me know and I will refer her to  Mickel Baas    The day that her sugar was 74 after breakfast is because she a  decent amount of starch but on the day that it was 200 after breakfast is because she ate a high starch meal     Thanks

## 2020-06-27 NOTE — Telephone Encounter (Signed)
Spoken to patient this morning and notified Dr Quin Hoop comments. Verbalized understanding.

## 2020-07-16 ENCOUNTER — Encounter: Payer: Self-pay | Admitting: Gastroenterology

## 2020-07-16 ENCOUNTER — Other Ambulatory Visit (INDEPENDENT_AMBULATORY_CARE_PROVIDER_SITE_OTHER): Payer: PPO

## 2020-07-16 ENCOUNTER — Ambulatory Visit: Payer: PPO | Admitting: Gastroenterology

## 2020-07-16 ENCOUNTER — Other Ambulatory Visit: Payer: Self-pay

## 2020-07-16 DIAGNOSIS — K862 Cyst of pancreas: Secondary | ICD-10-CM

## 2020-07-16 DIAGNOSIS — K219 Gastro-esophageal reflux disease without esophagitis: Secondary | ICD-10-CM | POA: Diagnosis not present

## 2020-07-16 LAB — BASIC METABOLIC PANEL
BUN: 12 mg/dL (ref 6–23)
CO2: 28 mEq/L (ref 19–32)
Calcium: 9.4 mg/dL (ref 8.4–10.5)
Chloride: 103 mEq/L (ref 96–112)
Creatinine, Ser: 0.8 mg/dL (ref 0.40–1.20)
GFR: 70.11 mL/min (ref >60.00)
Glucose, Bld: 200 mg/dL — ABNORMAL HIGH (ref 70–99)
Potassium: 4.2 mEq/L (ref 3.5–5.1)
Sodium: 138 mEq/L (ref 135–145)

## 2020-07-16 MED ORDER — OMEPRAZOLE 40 MG PO CPDR: 40.0000 mg | DELAYED_RELEASE_CAPSULE | Freq: Every day | ORAL | 11 refills | Status: DC

## 2020-07-16 NOTE — Progress Notes (Signed)
Review of pertinent gastrointestinal problems: 1.  Mucinous cystic neoplasm of the pancreas, underwent Whipple surgery with Dr. Birdie Sons in 2016 I believe.  Endoscopic ultrasound 2016 Dr. Ardis Hughs found multiple cysts throughout her pancreas.  2020 MRI and CT at [facility] showed no sign of retained stones in her bile duct.    HPI: This is a very pleasant 80 year old woman who is here with her new husband today.   I last saw her about 2 years ago at which time we discussed her brief hospital stay with fever and slightly elevated liver tests.  I recommended she consider repeating an MRI however she was mortified at even thinking about that.  I recommended she return to see me in 3 or 4 months or sooner if she had recurrent biliary-like symptoms.  I was somewhat concerned about potential retained bile duct stone however with her Whipple anatomy ERCP would be not possible at least in my hands.  We have not heard from her since then.  She has been having worsening of her chronic GERD symptoms.  She has had 4 or 5 episodes of significant waterbrash in the middle of the night.  No dysphagia, her weight is overall stable.  She cannot point to any dietary indiscretions which caused her waterbrash.  She takes omeprazole 20 mg shortly before breakfast.  She called into our office a month or so ago to report her difficulties with her overnight symptoms and she was recommended to start taking a Pepcid 20 mg at bedtime every night and she has started that and it has significantly helped her symptoms.  She has no abdominal discomforts.  She asked about pancreatic cyst follow-up.  She was being followed for years from Dr. Eugenia Pancoast however she graduated from his care.  Last imaging of her pancreas was about 2 years ago.    Review of systems: Pertinent positive and negative review of systems were noted in the above HPI section. All other review negative.   Past Medical History:  Diagnosis Date  .  Abnormal levels of other serum enzymes   . Anemia, unspecified   . Anxiety   . Anxiety and depression   . Anxiety disorder, unspecified   . Asthma   . Breast carcinoma ([place]) 08/2009   Invasive ductal; left; lumpectomy and sentinel node excision- oncology visits yearly.  . Chest pain   . Chronic obstructive pulmonary disease with (acute) exacerbation ([place])   . Depression   . Diabetes mellitus type II, controlled ([place])    diet controlled,exercise, no meds  . Diverticulitis 2012   Treated medically  . Exocrine pancreatic insufficiency   . Gastroesophageal reflux   . Hyperlipidemia   . Hyperlipidemia, unspecified   . Incisional hernia without obstruction or gangrene   . Lumbago with sciatica, unspecified side   . Major depressive disorder, single episode, unspecified   . Malignant neoplasm of unspecified site of unspecified female breast ([place])   . Menopause   . Mild persistent asthma with (acute) exacerbation   . Mild persistent asthma, uncomplicated   . Nephrolithiasis   . Other acariasis   . Other allergy, subsequent encounter   . Other specified diseases of pancreas   . Tick bite    [place] Tick, alpha gal positive, makes her allergic to meat  . Type 2 diabetes mellitus without complications ([place])   . Uterine prolapse     Past Surgical History:  Procedure Laterality Date  . BREAST LUMPECTOMY     Left  .  CESAREAN SECTION    . CHOLECYSTECTOMY    . COLONOSCOPY  2004  . DILATION AND CURETTAGE OF UTERUS    . EUS N/A 09/07/2014   Procedure: UPPER ENDOSCOPIC ULTRASOUND (EUS) LINEAR;  Surgeon: Milus Banister, MD;  Location: [facility];  Service: Endoscopy;  Laterality: N/A;  . HERNIA REPAIR    . NEPHROLITHOTOMY    . TUBAL LIGATION    . WHIPPLE PROCEDURE      Current Outpatient Medications  Medication Sig Dispense Refill  . albuterol (PROVENTIL HFA;VENTOLIN HFA) 108 (90 BASE) MCG/ACT inhaler Inhale 2 puffs into the lungs every 6 (six) hours as needed for wheezing.    .  Continuous Blood Gluc Receiver (FREESTYLE LIBRE READER) DEVI Use as instructed to check blood sugar daily 1 each 0  . Continuous Blood Gluc Sensor (DEXCOM G6 SENSOR) MISC 1 Device by Does not apply route as directed. 9 each 3  . Continuous Blood Gluc Sensor (FREESTYLE LIBRE 14 DAY SENSOR) MISC 1 Device by Does not apply route as directed. 6 each 3  . Continuous Blood Gluc Transmit (DEXCOM G6 TRANSMITTER) MISC 1 Device by Does not apply route as directed. 1 each 3  . fluticasone furoate-vilanterol (BREO ELLIPTA) 200-25 MCG/INH AEPB Inhale 1 puff into the lungs daily.    Marland Kitchen glucose blood (FREESTYLE LITE) test strip Use as instructed to check blood sugar daily 100 each 5  . HEMP OIL-VANILLYL BUTYL ETHER EX Apply topically.    . insulin glargine, 2 Unit Dial, (TOUJEO MAX SOLOSTAR) 300 UNIT/ML Solostar Pen Inject 8 Units into the skin daily. 15 mL 6  . insulin lispro (HUMALOG) 100 UNIT/ML KwikPen Inject 4 Units into the skin daily with breakfast AND 5 Units daily with lunch AND 5 Units daily with supper. 15 mL 11  . Lancets (FREESTYLE) lancets Use as instructed to check blood sugar daily 100 each 5  . Multiple Vitamin (ONE-DAILY MULTI-VITAMIN PO) Take by mouth.    Marland Kitchen omeprazole (PRILOSEC) 20 MG capsule Take 1 capsule (20 mg total) by mouth daily. 14 capsule 0  . Pancrelipase, Lip-Prot-Amyl, (CREON) 24000-76000 units CPEP TAKE 1 CAPSULE WITH MEALS AND SNACKS UP TO 6 A DAY (Patient taking differently: Take 1 capsule by mouth 3 (three) times daily with meals. TAKE 1 CAPSULE WITH MEALS AND SNACKS UP TO 6 A DAY) 180 capsule 2  . valACYclovir (VALTREX) 500 MG tablet Take 500 mg by mouth daily as needed (outbreak).     Marland Kitchen dicyclomine (BENTYL) 10 MG capsule Take 10 mg by mouth 2 (two) times daily as needed.     No current facility-administered medications for this visit.    Allergies as of 07/16/2020 - Review Complete 07/16/2020  Allergen Reaction Noted  . Amoxicillin  03/15/2019  . Biaxin [clarithromycin]  Other (See Comments) 11/25/2012  . Carafate [sucralfate] Other (See Comments) 10/07/2016  . Codeine Other (See Comments) 08/21/2010  . Doxycycline Other (See Comments) 10/07/2016  . Meat [alpha-gal]  05/02/2018  . Metronidazole Other (See Comments) 11/25/2012  . Cephalosporins Hives and Palpitations 11/25/2012    Family History  Problem Relation Age of Onset  . Coronary artery disease Mother   . Stroke Father   . Diabetes Mellitus II Father   . Colon cancer Neg Hx     Social History   Socioeconomic History  . Marital status: Divorced    Spouse name: Not on file  . Number of children: 2  . Years of education: Not on file  . Highest education level:  Not on file  Occupational History  . Occupation: Tourist information centre manager for [employer]  Tobacco Use  . Smoking status: Never Smoker  . Smokeless tobacco: Never Used  Vaping Use  . Vaping Use: Never used  Substance and Sexual Activity  . Alcohol use: No  . Drug use: No  . Sexual activity: Not Currently  Other Topics Concern  . Not on file  Social History Narrative  . Not on file   Social Determinants of Health   Financial Resource Strain: Not on file  Food Insecurity: Not on file  Transportation Needs: Not on file  Physical Activity: Not on file  Stress: Not on file  Social Connections: Not on file  Intimate Partner Violence: Not on file     Physical Exam: BP 120/60   Pulse 68   Ht 5' 1.25" (1.556 m)   Wt 147 lb 6.4 oz (66.9 kg)   BMI 27.62 kg/m  Constitutional: generally well-appearing Psychiatric: alert and oriented x3 Eyes: extraocular movements intact Mouth: oral pharynx moist, no lesions Neck: supple no lymphadenopathy Cardiovascular: heart regular rate and rhythm Lungs: clear to auscultation bilaterally Abdomen: soft, nontender, nondistended, no obvious ascites, no peritoneal signs, normal bowel sounds Extremities: no lower extremity edema bilaterally Skin: no lesions on visible extremities   Assessment and plan: 80  y.o. female with chronic GERD, history of pancreatic cysts  I recommended that she continue taking the Pepcid 20 mg at bedtime and I am increasing her omeprazole to 40 mg pills instead of 20 mg pills she will take 1 short before breakfast every morning.  She knows to call to report on how she is doing on this regimen in 2 months.  She can also MyChart me.  I explained to her that it is best if she avoid caffeinated beverages or alcoholic beverages in the afternoon hours and not to eat within 3 or 4 hours of laying down to bed.  I recommended 1 last imaging study of her pancreas given her history of mucinous pancreatic neoplasm and persistent pancreatic cyst.  She has modified MRIs and I think a CT will probably give [place] enough information here.  She is therefore getting up pancreatic protocol CT scan.  Please see the "Patient Instructions" section for addition details about the plan.   Owens Loffler, MD [facility] 07/16/2020, 9:57 AM  Cc:  Total time on date of encounter was 49minutes (this included time spent preparing to see the patient reviewing records; obtaining and/or reviewing separately obtained history; performing a medically appropriate exam and/or evaluation; counseling and educating the patient and family if present; ordering medications, tests or procedures if applicable; and documenting clinical information in the health record).

## 2020-07-16 NOTE — Patient Instructions (Addendum)
If you are age 80 or older, your body mass index should be between 23-30. Your Body mass index is 27.62 kg/m. If this is out of the aforementioned range listed, please consider follow up with your Primary Care Provider.  We have sent the following medications to your pharmacy for you to pick up at your convenience:  INCREASE: omeprazole 43m take one capsule shortly before breakfast meal each day.  CONTINUE: pepcid 228mat bedtime.  Your provider has requested that you go to the basement level for lab work before leaving today. Press "B" on the elevator. The lab is located at the first door on the left as you exit the elevator.   You have been scheduled for a CT [place] of the abdomen and pelvis [facility]this is in the same building as [employer]   You are scheduled on 07-24-2020 at 1pm. You should arrive at 12:15pm to your appointment time for registration. Please follow the written instructions below on the day of your exam:  WARNING: IF YOU ARE ALLERGIC TO IODINE/X-RAY DYE, PLEASE NOTIFY RADIOLOGY IMMEDIATELY AT [phone] WILL BE GIVEN A 13 HOUR PREMEDICATION PREP.  1) Do not eat or drink anything after 9am (4 hours prior to your test)  You may take any medications as prescribed with a small amount of water, if necessary. If you take any of the following medications: METFORMIN, GLUCOPHAGE, GLUCOVANCE, AVANDAMET, RIOMET, FORTAMET, [place]ET, JANUMET, GLUMETZA or METAGLIP, you MAY be asked to HOLD this medication 48 hours AFTER the exam.  The purpose of you drinking the oral contrast is to aid in the visualization of your intestinal tract. The contrast solution may cause some diarrhea. Depending on your individual set of symptoms, you may also receive an intravenous injection of x-ray contrast/dye. [facility] or [facility], depending on the type of exam you are having performed.  This test  typically takes 30-45 minutes to complete.  If you have any questions regarding your exam or if you need to reschedule, you may call the CT department at [phone] the hours of 8:00 am and 5:00 pm, Monday-Friday.  __________________________________________________________  Due to recent changes in healthcare laws, you may see the results of your imaging and laboratory studies on MyChart before your provider has had a chance to review them.  We understand that in some cases there may be results that are confusing or concerning to you. Not all laboratory results come back in the same time frame and the provider may be waiting for multiple results in order to interpret others.  Please give usKorea8 hours in order for your provider to thoroughly review all the results before contacting the office for clarification of your results.   Thank you for entrusting me with your care and [facility]

## 2020-07-19 ENCOUNTER — Other Ambulatory Visit (HOSPITAL_COMMUNITY): Payer: Self-pay | Admitting: Family Medicine

## 2020-07-19 DIAGNOSIS — Z1231 Encounter for screening mammogram for malignant neoplasm of breast: Secondary | ICD-10-CM

## 2020-07-24 ENCOUNTER — Inpatient Hospital Stay: Admission: RE | Admit: 2020-07-24 | Payer: PPO | Source: Ambulatory Visit

## 2020-07-26 ENCOUNTER — Other Ambulatory Visit (HOSPITAL_COMMUNITY): Payer: Self-pay | Admitting: Internal Medicine

## 2020-07-26 DIAGNOSIS — Z13820 Encounter for screening for osteoporosis: Secondary | ICD-10-CM

## 2020-08-02 DIAGNOSIS — E109 Type 1 diabetes mellitus without complications: Secondary | ICD-10-CM | POA: Diagnosis not present

## 2020-08-02 DIAGNOSIS — Z0001 Encounter for general adult medical examination with abnormal findings: Secondary | ICD-10-CM | POA: Diagnosis not present

## 2020-08-02 DIAGNOSIS — K219 Gastro-esophageal reflux disease without esophagitis: Secondary | ICD-10-CM | POA: Diagnosis not present

## 2020-08-06 ENCOUNTER — Ambulatory Visit (HOSPITAL_COMMUNITY)
Admission: RE | Admit: 2020-08-06 | Discharge: 2020-08-06 | Disposition: A | Discharge status: Home / Self Care | Payer: PPO | Source: Ambulatory Visit | Attending: Internal Medicine | Admitting: Internal Medicine

## 2020-08-06 ENCOUNTER — Ambulatory Visit (HOSPITAL_COMMUNITY)
Admission: RE | Admit: 2020-08-06 | Discharge: 2020-08-06 | Disposition: A | Discharge status: Home / Self Care | Payer: PPO | Source: Ambulatory Visit | Attending: Family Medicine | Admitting: Family Medicine

## 2020-08-06 DIAGNOSIS — Z13820 Encounter for screening for osteoporosis: Secondary | ICD-10-CM

## 2020-08-06 DIAGNOSIS — Z1231 Encounter for screening mammogram for malignant neoplasm of breast: Secondary | ICD-10-CM | POA: Insufficient documentation

## 2020-08-06 DIAGNOSIS — Z853 Personal history of malignant neoplasm of breast: Secondary | ICD-10-CM | POA: Insufficient documentation

## 2020-08-06 DIAGNOSIS — Z780 Asymptomatic menopausal state: Secondary | ICD-10-CM | POA: Diagnosis not present

## 2020-08-06 DIAGNOSIS — Z794 Long term (current) use of insulin: Secondary | ICD-10-CM | POA: Diagnosis not present

## 2020-08-06 DIAGNOSIS — M8589 Other specified disorders of bone density and structure, multiple sites: Secondary | ICD-10-CM | POA: Insufficient documentation

## 2020-08-06 DIAGNOSIS — E119 Type 2 diabetes mellitus without complications: Secondary | ICD-10-CM | POA: Insufficient documentation

## 2020-08-07 ENCOUNTER — Ambulatory Visit (INDEPENDENT_AMBULATORY_CARE_PROVIDER_SITE_OTHER)
Admission: RE | Admit: 2020-08-07 | Discharge: 2020-08-07 | Disposition: A | Discharge status: Home / Self Care | Payer: PPO | Source: Ambulatory Visit | Attending: Gastroenterology | Admitting: Gastroenterology

## 2020-08-07 ENCOUNTER — Other Ambulatory Visit: Payer: Self-pay

## 2020-08-07 DIAGNOSIS — K862 Cyst of pancreas: Secondary | ICD-10-CM

## 2020-08-07 DIAGNOSIS — K8689 Other specified diseases of pancreas: Secondary | ICD-10-CM | POA: Diagnosis not present

## 2020-08-07 DIAGNOSIS — Z9889 Other specified postprocedural states: Secondary | ICD-10-CM | POA: Diagnosis not present

## 2020-08-07 DIAGNOSIS — I700 Atherosclerosis of aorta: Secondary | ICD-10-CM | POA: Diagnosis not present

## 2020-08-07 MED ORDER — IOHEXOL 300 MG/ML  SOLN
80.0000 mL | Freq: Once | INTRAMUSCULAR | Status: AC | PRN
  Administered 2020-08-07: 80 mL via INTRAVENOUS

## 2020-08-09 DIAGNOSIS — Z708 Other sex counseling: Secondary | ICD-10-CM | POA: Diagnosis not present

## 2020-08-09 DIAGNOSIS — A6000 Herpesviral infection of urogenital system, unspecified: Secondary | ICD-10-CM | POA: Diagnosis not present

## 2020-08-09 DIAGNOSIS — K219 Gastro-esophageal reflux disease without esophagitis: Secondary | ICD-10-CM | POA: Diagnosis not present

## 2020-08-09 DIAGNOSIS — R945 Abnormal results of liver function studies: Secondary | ICD-10-CM | POA: Diagnosis not present

## 2020-08-09 DIAGNOSIS — Z0001 Encounter for general adult medical examination with abnormal findings: Secondary | ICD-10-CM | POA: Diagnosis not present

## 2020-08-09 DIAGNOSIS — J45909 Unspecified asthma, uncomplicated: Secondary | ICD-10-CM | POA: Diagnosis not present

## 2020-08-09 DIAGNOSIS — E109 Type 1 diabetes mellitus without complications: Secondary | ICD-10-CM | POA: Diagnosis not present

## 2020-08-09 DIAGNOSIS — K862 Cyst of pancreas: Secondary | ICD-10-CM | POA: Diagnosis not present

## 2020-08-31 ENCOUNTER — Telehealth: Payer: Self-pay | Admitting: Internal Medicine

## 2020-08-31 NOTE — Telephone Encounter (Signed)
Spoken to patient and given her [place] number [phone] so she can call with an update on her Dexcom

## 2020-08-31 NOTE — Telephone Encounter (Signed)
I sent the prescription  to [facility] . If they are the ones who called her, she needs to call them so they can ship it to her.

## 2020-08-31 NOTE — Telephone Encounter (Signed)
Patient called to advise that her insurance called and told her that she was approved for the [place]. Patient is asking what she needs to do now?   Please advise at [phone] (OK to leave detailed message)

## 2020-10-18 ENCOUNTER — Other Ambulatory Visit: Payer: Self-pay

## 2020-10-18 ENCOUNTER — Encounter: Payer: Self-pay | Admitting: Internal Medicine

## 2020-10-18 ENCOUNTER — Ambulatory Visit (INDEPENDENT_AMBULATORY_CARE_PROVIDER_SITE_OTHER): Payer: HMO | Admitting: Internal Medicine

## 2020-10-18 VITALS — BP 122/64 | HR 70 | Ht 61.0 in | Wt 150.0 lb

## 2020-10-18 DIAGNOSIS — E1165 Type 2 diabetes mellitus with hyperglycemia: Secondary | ICD-10-CM

## 2020-10-18 DIAGNOSIS — Z794 Long term (current) use of insulin: Secondary | ICD-10-CM

## 2020-10-18 DIAGNOSIS — E119 Type 2 diabetes mellitus without complications: Secondary | ICD-10-CM | POA: Diagnosis not present

## 2020-10-18 LAB — POCT GLYCOSYLATED HEMOGLOBIN (HGB A1C): Hemoglobin A1C: 7.7 % — AB (ref 4.0–5.6)

## 2020-10-18 MED ORDER — DEXCOM G6 SENSOR MISC
1.0000 | 3 refills | Status: DC
Start: 2020-10-18 — End: 2021-04-02

## 2020-10-18 MED ORDER — INSULIN PEN NEEDLE 32G X 4 MM MISC: 1.0000 | Freq: Four times a day (QID) | 3 refills | Status: DC

## 2020-10-18 MED ORDER — DEXCOM G6 RECEIVER DEVI: 1.0000 | 0 refills | Status: DC

## 2020-10-18 MED ORDER — DEXCOM G6 TRANSMITTER MISC
1.0000 | 3 refills | Status: DC
Start: 2020-10-18 — End: 2021-04-02

## 2020-10-18 MED ORDER — INSULIN LISPRO (1 UNIT DIAL) 100 UNIT/ML (KWIKPEN): PEN_INJECTOR | SUBCUTANEOUS | 3 refills | Status: DC

## 2020-10-18 MED ORDER — FREESTYLE PRECISION NEO TEST VI STRP: 1.0000 | ORAL_STRIP | Freq: Four times a day (QID) | 3 refills | Status: DC

## 2020-10-18 NOTE — Progress Notes (Signed)
Name: Betty Nguyen  Age/ Sex: 80 y.o., female   MRN/ DOB: [id number], [birth year]     PCP: Celene Squibb, MD   Reason for Endocrinology Evaluation: Type 2 Diabetes Mellitus  Initial Endocrine Consultative Visit: 09/06/2019    PATIENT IDENTIFIER: Betty Nguyen is a 80 y.o. female with a past medical history of T2DM, Asthma, Hx of breast Ca , Hx of whipples procedure. The patient has followed with Endocrinology clinic since 09/06/2019  for consultative assistance with Nguyen of her diabetes.  DIABETIC HISTORY:  Betty Nguyen was diagnosed with DM after whipple's procedure years ago ( due to pancreatic cysts). Synjardy - weight loss .  hemoglobin A1c has ranged from  7.3% in 2019, peaking at 9.6% in 2018.   SUBJECTIVE:     During the last visit (02/10/2020): A1c 7.9 % , adjusted MDI regimen     Today (10/18/2020): Betty Nguyen. She is accompanied by her husband Betty Nguyen.  She checks her blood sugars multiple times a day through CGM .   The patient has not had hypoglycemic episodes since the last clinic visit.   Denies nausea or diarrhea  Today had issues of stomach issues     HOME DIABETES REGIMEN:  Toujeo 8 units daily- has been using 10 ui daily  Humalog 4/5/5 units - 4 for breakfast, 5 units with Lunch and 4 units with Supper  CF : Humalog (BG-150/60)   CONTINUOUS GLUCOSE MONITORING RECORD INTERPRETATION    Dates of Recording: 6/10-6/23/2022  Sensor description:freestyke libre  Results statistics:   CGM use % of time 97  Average and SD 197/30.4  Time in range   43 %  % Time Above 180 38  % Time above 250 19  % Time Below target 0    Glycemic patterns summary: postprandial hyperglycemia  Hyperglycemic episodes  During the day  Hypoglycemic episodes occurred N/A  Overnight periods: trends down     DIABETIC COMPLICATIONS: Microvascular complications:    Denies: CKD, retinopathy, neuropathy Last eye exam:  Completed 08/2018   Macrovascular complications:    Denies: CAD, PVD, CVA    HISTORY:  Past Medical History:  Past Medical History:  Diagnosis Date   Abnormal levels of other serum enzymes    Anemia, unspecified    Anxiety    Anxiety and depression    Anxiety disorder, unspecified    Asthma    Breast carcinoma ([place]) 08/2009   Invasive ductal; left; lumpectomy and sentinel node excision- oncology visits yearly.   Chest pain    Chronic obstructive pulmonary disease with (acute) exacerbation ([facility])    Depression    Diabetes mellitus type II, controlled ([place])    diet controlled,exercise, no meds   Diverticulitis 2012   Treated medically   Exocrine pancreatic insufficiency    Gastroesophageal reflux    Hyperlipidemia    Hyperlipidemia, unspecified    Incisional hernia without obstruction or gangrene    Lumbago with sciatica, unspecified side    Major depressive disorder, single episode, unspecified    Malignant neoplasm of unspecified site of unspecified female breast ([place])    Menopause    Mild persistent asthma with (acute) exacerbation    Mild persistent asthma, uncomplicated    Nephrolithiasis    Other acariasis    Other allergy, subsequent encounter    Other specified diseases of pancreas    Tick bite    [place] Tick, alpha gal positive, makes her  allergic to meat   Type 2 diabetes mellitus without complications ([place])    Uterine prolapse    Past Surgical History:  Past Surgical History:  Procedure Laterality Date   BREAST LUMPECTOMY     Left   CESAREAN SECTION     CHOLECYSTECTOMY     COLONOSCOPY  2004   DILATION AND CURETTAGE OF UTERUS     EUS N/A 09/07/2014   Procedure: UPPER ENDOSCOPIC ULTRASOUND (EUS) LINEAR;  Surgeon: Milus Banister, MD;  Location: [facility];  Service: Endoscopy;  Laterality: N/A;   HERNIA REPAIR     NEPHROLITHOTOMY     TUBAL LIGATION     WHIPPLE PROCEDURE     Social History:  reports that she has never smoked. She has never used  smokeless tobacco. She reports that she does not drink alcohol and does not use drugs. Family History:  Family History  Problem Relation Age of Onset   Coronary artery disease Mother    Stroke Father    Diabetes Mellitus II Father    Colon cancer Neg Hx      HOME MEDICATIONS: Allergies as of 10/18/2020       Reactions   Amoxicillin    Biaxin [clarithromycin] Other (See Comments)   Hallucinations   Carafate [sucralfate] Other (See Comments)   Flared up her vertigo, run her blood sugar up and caused constipation   Codeine Other (See Comments)   Hallucinations    Doxycycline Other (See Comments)   GI upset , turned stool orange   Meat [alpha-gal]    RED MEAT    Metronidazole Other (See Comments)   Heart Palpations -flagyl   Cephalosporins Hives, Palpitations        Medication List        Accurate as of October 18, 2020 10:49 AM. If you have any questions, ask your nurse or doctor.          STOP taking these medications    freestyle lancets Stopped by: Dorita Sciara, MD       TAKE these medications    albuterol 108 (90 Base) MCG/ACT inhaler Commonly known as: VENTOLIN HFA Inhale 2 puffs into the lungs every 6 (six) hours as needed for wheezing.   Dexcom G6 Transmitter Misc 1 Device by Does not apply route as directed.   dicyclomine 10 MG capsule Commonly known as: BENTYL Take 40 mg by mouth 2 (two) times daily as needed.   fluticasone furoate-vilanterol 200-25 MCG/INH Aepb Commonly known as: BREO ELLIPTA Inhale 1 puff into the lungs daily.   FreeStyle Libre 14 Day Sensor Misc 1 Device by Does not apply route as directed.   Dexcom G6 Sensor Misc 1 Device by Does not apply route as directed.   FreeStyle [employer] Use as instructed to check blood sugar daily What changed: Another medication with the same name was added. Make sure you understand how and when to take each. Changed by: Dorita Sciara, MD   Dexcom G6 Receiver  Devi 1 Device by Does not apply route as directed. What changed: You were already taking a medication with the same name, and this prescription was added. Make sure you understand how and when to take each. Changed by: Dorita Sciara, MD   FreeStyle Precision Neo Test test strip Generic drug: glucose blood 1 each by Other route in the morning, at noon, in the evening, and at bedtime. Use as instructed What changed:  how much to take how to take this  when to take this additional instructions Changed by: Dorita Sciara, MD   HEMP OIL-VANILLYL BUTYL ETHER EX Apply topically.   insulin lispro 100 UNIT/ML KwikPen Commonly known as: HUMALOG Max daily 30 units What changed: See the new instructions. Changed by: Dorita Sciara, MD   Insulin Pen Needle 32G X 4 MM Misc 1 Device by Does not apply route in the morning, at noon, in the evening, and at bedtime. Started by: Dorita Sciara, MD   omeprazole 40 MG capsule Commonly known as: PRILOSEC Take 1 capsule (40 mg total) by mouth daily. Take 1 capsule shortly before breakfast meal each day.   ONE-DAILY MULTI-VITAMIN PO Take by mouth.   Pancrelipase (Lip-Prot-Amyl) 24000-76000 units Cpep Commonly known as: Creon TAKE 1 CAPSULE WITH MEALS AND SNACKS UP TO 6 A DAY What changed:  how much to take how to take this when to take this   Toujeo Max SoloStar 300 UNIT/ML Solostar Pen Generic drug: insulin glargine (2 Unit Dial) Inject 8 Units into the skin daily.   valACYclovir 500 MG tablet Commonly known as: VALTREX Take 500 mg by mouth daily as needed (outbreak).         OBJECTIVE:   Vital Signs: BP 122/64   Pulse 70   Ht 5\' 1"  (1.549 m)   Wt 150 lb (68 kg)   SpO2 99%   BMI 28.34 kg/m   Wt Readings from Last 3 Encounters:  10/18/20 150 lb (68 kg)  07/16/20 147 lb 6.4 oz (66.9 kg)  06/14/20 147 lb 8 oz (66.9 kg)     Exam: General: Pt appears well and is in NAD  Neck: General: Supple  without adenopathy. Thyroid: Thyroid size normal.  No goiter or nodules appreciated. No thyroid bruit.  Lungs: Clear with good BS bilat with no rales, rhonchi, or wheezes  Heart: RRR with normal S1 and S2 and no gallops; no murmurs; no rub  Abdomen: Normoactive bowel sounds, soft, nontender, without masses or organomegaly palpable  Extremities: No pretibial edema.   Neuro: MS is good with appropriate affect, pt is alert and Ox3    DM foot exam: 10/18/2020   The skin of the feet is without sores or ulcerations. The pedal pulses are 1+ on right and 1+ on left. The sensation is decreased to a screening 5.07, 10 gram monofilament on the left    DATA REVIEWED:  Lab Results  Component Value Date   HGBA1C 7.7 (A) 10/18/2020   HGBA1C 8.1 (A) 06/14/2020   HGBA1C 7.9 (A) 02/10/2020   Lab Results  Component Value Date   MICROALBUR 0.7 01/27/2018   LDLCALC 58 04/17/2017   CREATININE 0.80 07/16/2020   Lab Results  Component Value Date   MICRALBCREAT 19 01/27/2018     Lab Results  Component Value Date   CHOL 140 04/17/2017   HDL 69 04/17/2017   LDLCALC 58 04/17/2017   TRIG 58 04/17/2017   CHOLHDL 2.0 04/17/2017         ASSESSMENT / PLAN / RECOMMENDATIONS:   1) Insulin-Dependant Diabetes Mellitus, Poorly  controlled, Without complications - Most recent A1c of 7.7%. Goal A1c < 7.5 %.    -  She has severe fear of hypoglycemia, she continues to  eat  at bedtime if BG's are in the low 100's. Then she would adjust her basal insulin according to her bedtime BG readings which I have advised her against increasing her basal insulin multiple times due to high risk of hypoglycemia.  -  She continues to tell me that she never understands how insulin affects her and that she was never told but I have explained her the pharmacokinetics of basal/prandial insulins multiple times in the past with the last one was in the presence of her husband  - I am not going to change her dose because she keeps  adjusting things on her own, all I can ask her to do is follow the instructions on the AVS  - She is frustrated of her inability to obtain dexcom but I explained to her this is unfortunately depending on her insurance coverage. I have already sent a prescription to [facility] on the last visit and today I sent it to her local    MEDICATIONS: - Continue Toujeo 8 units daily  - Humalog 4 units with Breakfast, 5 units with Lunch and  4 units Supper - CF : Humalog ( BG -150/65)   EDUCATION / INSTRUCTIONS: BG monitoring instructions: Patient is instructed to check her blood sugars 4 times a day, before meals and bedtime . Call [facility] if: BG persistently < 70 I reviewed the Rule of 15 for the treatment of hypoglycemia in detail with the patient. Literature supplied.    2) Diabetic complications:  Eye: Does not have known diabetic retinopathy.  Neuro/ Feet: Does not have known diabetic peripheral neuropathy. Renal: Patient does not have known baseline CKD. She is not on an ACEI/ARB at present.    F/U in 3 months    Signed electronically by: Mack Guise, MD  [facility]  [facility] [address] [place], [place] [address] Phone: [phone] FAX: [phone]   CC: Celene Squibb, MD [place] [address] Phone: [phone]  Fax: [phone]  Return to Endocrinology clinic as below: Future Appointments  Date Time Provider [place]  10/18/2020 10:50 AM Issaac Shipper, Melanie Crazier, MD [facility] None

## 2020-10-18 NOTE — Patient Instructions (Addendum)
-   Continue Toujeo 8 units daily  - Humalog 4 units with Breakfast, 5 units with Lunch and 4 units with Supper  -Humalog correctional insulin: ADD extra units on insulin to your meal-time Humalog dose if your blood sugars are higher than 215. Use the scale below to help guide you:   Blood sugar before meal Number of units to inject  Less than 215 0 unit  216 -  280 1 units  281 -  345 2 units  346 -  410 3 units  411 -  475 4 units  476 -  540 5 units   Capsaicin Cream    HOW TO TREAT LOW BLOOD SUGARS (Blood sugar LESS THAN 70 MG/DL) Please follow the RULE OF 15 for the treatment of hypoglycemia treatment (when your (blood sugars are less than 70 mg/dL)   STEP 1: Take 15 grams of carbohydrates when your blood sugar is low, which includes:  3-4 GLUCOSE TABS  OR 3-4 OZ OF JUICE OR REGULAR SODA OR ONE TUBE OF GLUCOSE GEL    STEP 2: RECHECK blood sugar in 15 MINUTES STEP 3: If your blood sugar is still low at the 15 minute recheck --> then, go back to STEP 1 and treat AGAIN with another 15 grams of carbohydrates.

## 2020-11-12 ENCOUNTER — Other Ambulatory Visit: Payer: Self-pay | Admitting: Internal Medicine

## 2020-11-13 DIAGNOSIS — E782 Mixed hyperlipidemia: Secondary | ICD-10-CM | POA: Diagnosis not present

## 2020-11-13 DIAGNOSIS — E109 Type 1 diabetes mellitus without complications: Secondary | ICD-10-CM | POA: Diagnosis not present

## 2020-11-15 DIAGNOSIS — B009 Herpesviral infection, unspecified: Secondary | ICD-10-CM | POA: Diagnosis not present

## 2020-11-15 DIAGNOSIS — K219 Gastro-esophageal reflux disease without esophagitis: Secondary | ICD-10-CM | POA: Diagnosis not present

## 2020-11-15 DIAGNOSIS — J4520 Mild intermittent asthma, uncomplicated: Secondary | ICD-10-CM | POA: Diagnosis not present

## 2020-11-15 DIAGNOSIS — M25561 Pain in right knee: Secondary | ICD-10-CM | POA: Diagnosis not present

## 2020-11-15 DIAGNOSIS — E109 Type 1 diabetes mellitus without complications: Secondary | ICD-10-CM | POA: Diagnosis not present

## 2020-11-15 DIAGNOSIS — I10 Essential (primary) hypertension: Secondary | ICD-10-CM | POA: Diagnosis not present

## 2020-11-15 DIAGNOSIS — R945 Abnormal results of liver function studies: Secondary | ICD-10-CM | POA: Diagnosis not present

## 2020-11-20 DIAGNOSIS — M170 Bilateral primary osteoarthritis of knee: Secondary | ICD-10-CM | POA: Diagnosis not present

## 2021-01-14 ENCOUNTER — Other Ambulatory Visit: Payer: Self-pay | Admitting: Internal Medicine

## 2021-01-17 ENCOUNTER — Other Ambulatory Visit: Payer: Self-pay

## 2021-01-22 DIAGNOSIS — L989 Disorder of the skin and subcutaneous tissue, unspecified: Secondary | ICD-10-CM | POA: Diagnosis not present

## 2021-01-25 DIAGNOSIS — E1165 Type 2 diabetes mellitus with hyperglycemia: Secondary | ICD-10-CM | POA: Diagnosis not present

## 2021-01-25 DIAGNOSIS — I10 Essential (primary) hypertension: Secondary | ICD-10-CM | POA: Diagnosis not present

## 2021-02-08 ENCOUNTER — Telehealth: Payer: Self-pay | Admitting: Internal Medicine

## 2021-02-08 MED ORDER — INSULIN LISPRO (1 UNIT DIAL) 100 UNIT/ML (KWIKPEN): PEN_INJECTOR | SUBCUTANEOUS | 3 refills | Status: AC

## 2021-02-08 MED ORDER — INSULIN LISPRO (1 UNIT DIAL) 100 UNIT/ML (KWIKPEN): PEN_INJECTOR | SUBCUTANEOUS | 3 refills | Status: DC

## 2021-02-08 NOTE — Telephone Encounter (Signed)
Patient states that insurance will now cover Humalog and would like to switch back.

## 2021-02-08 NOTE — Telephone Encounter (Signed)
[facility] requests to be called at ph# [phone] re: Since Patient's medication change from Humalog to Novolog (approx. 1 week ago) Patient's blood sugars have been running high (Highest 367-02/07/21 pm =311)

## 2021-02-08 NOTE — Telephone Encounter (Signed)
Patient notified and will pick up Humalog and see you at follow up in December

## 2021-02-08 NOTE — Addendum Note (Signed)
Addended by: Jefferson Fuel on: 02/08/2021 02:08 PM   Modules accepted: Orders

## 2021-02-11 ENCOUNTER — Telehealth: Payer: Self-pay | Admitting: Internal Medicine

## 2021-02-11 NOTE — Telephone Encounter (Signed)
[facility] calling in to say insulin lispro (HUMALOG KWIKPEN) needs a PA or choose a different insulin that ins DOES cover.

## 2021-02-12 ENCOUNTER — Other Ambulatory Visit (HOSPITAL_COMMUNITY): Payer: Self-pay

## 2021-02-15 DIAGNOSIS — E109 Type 1 diabetes mellitus without complications: Secondary | ICD-10-CM | POA: Diagnosis not present

## 2021-02-25 DIAGNOSIS — E1165 Type 2 diabetes mellitus with hyperglycemia: Secondary | ICD-10-CM | POA: Diagnosis not present

## 2021-02-25 DIAGNOSIS — I10 Essential (primary) hypertension: Secondary | ICD-10-CM | POA: Diagnosis not present

## 2021-03-06 ENCOUNTER — Other Ambulatory Visit: Payer: Self-pay | Admitting: Family Medicine

## 2021-03-06 ENCOUNTER — Other Ambulatory Visit (HOSPITAL_COMMUNITY): Payer: Self-pay | Admitting: Family Medicine

## 2021-03-06 DIAGNOSIS — Z23 Encounter for immunization: Secondary | ICD-10-CM | POA: Diagnosis not present

## 2021-03-06 DIAGNOSIS — I10 Essential (primary) hypertension: Secondary | ICD-10-CM | POA: Diagnosis not present

## 2021-03-06 DIAGNOSIS — R945 Abnormal results of liver function studies: Secondary | ICD-10-CM

## 2021-03-06 DIAGNOSIS — E109 Type 1 diabetes mellitus without complications: Secondary | ICD-10-CM | POA: Diagnosis not present

## 2021-03-06 DIAGNOSIS — Z888 Allergy status to other drugs, medicaments and biological substances status: Secondary | ICD-10-CM | POA: Diagnosis not present

## 2021-03-15 ENCOUNTER — Other Ambulatory Visit: Payer: Self-pay

## 2021-03-15 ENCOUNTER — Ambulatory Visit (HOSPITAL_COMMUNITY)
Admission: RE | Admit: 2021-03-15 | Discharge: 2021-03-15 | Disposition: A | Discharge status: Home / Self Care | Payer: HMO | Source: Ambulatory Visit | Attending: Family Medicine | Admitting: Family Medicine

## 2021-03-15 DIAGNOSIS — K7689 Other specified diseases of liver: Secondary | ICD-10-CM | POA: Diagnosis not present

## 2021-03-15 DIAGNOSIS — R945 Abnormal results of liver function studies: Secondary | ICD-10-CM | POA: Diagnosis not present

## 2021-03-27 DIAGNOSIS — E782 Mixed hyperlipidemia: Secondary | ICD-10-CM | POA: Diagnosis not present

## 2021-03-27 DIAGNOSIS — I10 Essential (primary) hypertension: Secondary | ICD-10-CM | POA: Diagnosis not present

## 2021-04-02 ENCOUNTER — Other Ambulatory Visit: Payer: Self-pay

## 2021-04-02 ENCOUNTER — Emergency Department (HOSPITAL_COMMUNITY): Payer: HMO

## 2021-04-02 ENCOUNTER — Encounter (HOSPITAL_COMMUNITY): Payer: Self-pay | Admitting: Emergency Medicine

## 2021-04-02 ENCOUNTER — Inpatient Hospital Stay (HOSPITAL_COMMUNITY): Payer: HMO

## 2021-04-02 ENCOUNTER — Inpatient Hospital Stay (HOSPITAL_COMMUNITY)
Admission: EM | Admit: 2021-04-02 | Discharge: 2021-04-03 | DRG: 439 | Disposition: A | Discharge status: Other Acute Inpatient Hospital | Payer: HMO | Attending: Family Medicine | Admitting: Family Medicine

## 2021-04-02 DIAGNOSIS — Z885 Allergy status to narcotic agent status: Secondary | ICD-10-CM | POA: Diagnosis not present

## 2021-04-02 DIAGNOSIS — Z8249 Family history of ischemic heart disease and other diseases of the circulatory system: Secondary | ICD-10-CM

## 2021-04-02 DIAGNOSIS — K838 Other specified diseases of biliary tract: Secondary | ICD-10-CM | POA: Diagnosis not present

## 2021-04-02 DIAGNOSIS — Z7951 Long term (current) use of inhaled steroids: Secondary | ICD-10-CM

## 2021-04-02 DIAGNOSIS — R109 Unspecified abdominal pain: Secondary | ICD-10-CM | POA: Diagnosis not present

## 2021-04-02 DIAGNOSIS — J449 Chronic obstructive pulmonary disease, unspecified: Secondary | ICD-10-CM | POA: Diagnosis not present

## 2021-04-02 DIAGNOSIS — Z833 Family history of diabetes mellitus: Secondary | ICD-10-CM | POA: Diagnosis not present

## 2021-04-02 DIAGNOSIS — Z881 Allergy status to other antibiotic agents status: Secondary | ICD-10-CM | POA: Diagnosis not present

## 2021-04-02 DIAGNOSIS — K8689 Other specified diseases of pancreas: Secondary | ICD-10-CM | POA: Diagnosis not present

## 2021-04-02 DIAGNOSIS — Z853 Personal history of malignant neoplasm of breast: Secondary | ICD-10-CM

## 2021-04-02 DIAGNOSIS — E871 Hypo-osmolality and hyponatremia: Secondary | ICD-10-CM | POA: Diagnosis not present

## 2021-04-02 DIAGNOSIS — K859 Acute pancreatitis without necrosis or infection, unspecified: Secondary | ICD-10-CM | POA: Diagnosis not present

## 2021-04-02 DIAGNOSIS — Z79899 Other long term (current) drug therapy: Secondary | ICD-10-CM

## 2021-04-02 DIAGNOSIS — Z9049 Acquired absence of other specified parts of digestive tract: Secondary | ICD-10-CM | POA: Diagnosis not present

## 2021-04-02 DIAGNOSIS — R7989 Other specified abnormal findings of blood chemistry: Secondary | ICD-10-CM | POA: Diagnosis not present

## 2021-04-02 DIAGNOSIS — K219 Gastro-esophageal reflux disease without esophagitis: Secondary | ICD-10-CM | POA: Diagnosis present

## 2021-04-02 DIAGNOSIS — Z87442 Personal history of urinary calculi: Secondary | ICD-10-CM

## 2021-04-02 DIAGNOSIS — E785 Hyperlipidemia, unspecified: Secondary | ICD-10-CM | POA: Diagnosis present

## 2021-04-02 DIAGNOSIS — Z20822 Contact with and (suspected) exposure to covid-19: Secondary | ICD-10-CM | POA: Diagnosis not present

## 2021-04-02 DIAGNOSIS — J4540 Moderate persistent asthma, uncomplicated: Secondary | ICD-10-CM | POA: Diagnosis not present

## 2021-04-02 DIAGNOSIS — J4530 Mild persistent asthma, uncomplicated: Secondary | ICD-10-CM | POA: Diagnosis present

## 2021-04-02 DIAGNOSIS — N281 Cyst of kidney, acquired: Secondary | ICD-10-CM | POA: Diagnosis not present

## 2021-04-02 DIAGNOSIS — K5730 Diverticulosis of large intestine without perforation or abscess without bleeding: Secondary | ICD-10-CM | POA: Diagnosis not present

## 2021-04-02 DIAGNOSIS — K862 Cyst of pancreas: Secondary | ICD-10-CM | POA: Diagnosis present

## 2021-04-02 DIAGNOSIS — K851 Biliary acute pancreatitis without necrosis or infection: Secondary | ICD-10-CM | POA: Diagnosis not present

## 2021-04-02 DIAGNOSIS — Z91014 Allergy to mammalian meats: Secondary | ICD-10-CM | POA: Diagnosis not present

## 2021-04-02 DIAGNOSIS — K831 Obstruction of bile duct: Secondary | ICD-10-CM | POA: Insufficient documentation

## 2021-04-02 DIAGNOSIS — Z794 Long term (current) use of insulin: Secondary | ICD-10-CM

## 2021-04-02 DIAGNOSIS — I10 Essential (primary) hypertension: Secondary | ICD-10-CM | POA: Diagnosis not present

## 2021-04-02 DIAGNOSIS — Z823 Family history of stroke: Secondary | ICD-10-CM

## 2021-04-02 DIAGNOSIS — Z980 Intestinal bypass and anastomosis status: Secondary | ICD-10-CM | POA: Diagnosis not present

## 2021-04-02 DIAGNOSIS — K8309 Other cholangitis: Secondary | ICD-10-CM | POA: Diagnosis not present

## 2021-04-02 DIAGNOSIS — R7881 Bacteremia: Secondary | ICD-10-CM | POA: Diagnosis not present

## 2021-04-02 DIAGNOSIS — Z8719 Personal history of other diseases of the digestive system: Secondary | ICD-10-CM | POA: Diagnosis not present

## 2021-04-02 DIAGNOSIS — E1165 Type 2 diabetes mellitus with hyperglycemia: Secondary | ICD-10-CM | POA: Diagnosis not present

## 2021-04-02 DIAGNOSIS — F329 Major depressive disorder, single episode, unspecified: Secondary | ICD-10-CM | POA: Diagnosis not present

## 2021-04-02 DIAGNOSIS — B955 Unspecified streptococcus as the cause of diseases classified elsewhere: Secondary | ICD-10-CM | POA: Diagnosis not present

## 2021-04-02 DIAGNOSIS — R52 Pain, unspecified: Secondary | ICD-10-CM

## 2021-04-02 DIAGNOSIS — Z880 Allergy status to penicillin: Secondary | ICD-10-CM

## 2021-04-02 DIAGNOSIS — R112 Nausea with vomiting, unspecified: Secondary | ICD-10-CM | POA: Diagnosis not present

## 2021-04-02 DIAGNOSIS — K8681 Exocrine pancreatic insufficiency: Secondary | ICD-10-CM | POA: Diagnosis not present

## 2021-04-02 DIAGNOSIS — E139 Other specified diabetes mellitus without complications: Secondary | ICD-10-CM | POA: Diagnosis not present

## 2021-04-02 DIAGNOSIS — Z90411 Acquired partial absence of pancreas: Secondary | ICD-10-CM | POA: Diagnosis not present

## 2021-04-02 DIAGNOSIS — Z8507 Personal history of malignant neoplasm of pancreas: Secondary | ICD-10-CM | POA: Diagnosis not present

## 2021-04-02 DIAGNOSIS — R1110 Vomiting, unspecified: Secondary | ICD-10-CM | POA: Diagnosis not present

## 2021-04-02 DIAGNOSIS — R748 Abnormal levels of other serum enzymes: Secondary | ICD-10-CM | POA: Diagnosis not present

## 2021-04-02 DIAGNOSIS — R079 Chest pain, unspecified: Secondary | ICD-10-CM | POA: Diagnosis not present

## 2021-04-02 DIAGNOSIS — Z87891 Personal history of nicotine dependence: Secondary | ICD-10-CM | POA: Diagnosis not present

## 2021-04-02 DIAGNOSIS — R1013 Epigastric pain: Secondary | ICD-10-CM | POA: Diagnosis not present

## 2021-04-02 DIAGNOSIS — R933 Abnormal findings on diagnostic imaging of other parts of digestive tract: Secondary | ICD-10-CM | POA: Diagnosis not present

## 2021-04-02 DIAGNOSIS — B9620 Unspecified Escherichia coli [E. coli] as the cause of diseases classified elsewhere: Secondary | ICD-10-CM | POA: Diagnosis not present

## 2021-04-02 DIAGNOSIS — K7689 Other specified diseases of liver: Secondary | ICD-10-CM | POA: Diagnosis not present

## 2021-04-02 DIAGNOSIS — Z888 Allergy status to other drugs, medicaments and biological substances status: Secondary | ICD-10-CM | POA: Diagnosis not present

## 2021-04-02 DIAGNOSIS — F419 Anxiety disorder, unspecified: Secondary | ICD-10-CM | POA: Diagnosis present

## 2021-04-02 DIAGNOSIS — E891 Postprocedural hypoinsulinemia: Secondary | ICD-10-CM | POA: Diagnosis not present

## 2021-04-02 DIAGNOSIS — R000 Tachycardia, unspecified: Secondary | ICD-10-CM | POA: Diagnosis present

## 2021-04-02 DIAGNOSIS — R945 Abnormal results of liver function studies: Secondary | ICD-10-CM | POA: Diagnosis not present

## 2021-04-02 LAB — CBC
HCT: 40.4 % (ref 36.0–46.0)
Hemoglobin: 13.4 g/dL (ref 12.0–15.0)
MCH: 31.4 pg (ref 26.0–34.0)
MCHC: 33.2 g/dL (ref 30.0–36.0)
MCV: 94.6 fL (ref 80.0–100.0)
Platelets: 197 10*3/uL (ref 150–400)
RBC: 4.27 MIL/uL (ref 3.87–5.11)
RDW: 12.4 % (ref 11.5–15.5)
WBC: 9.9 10*3/uL (ref 4.0–10.5)
nRBC: 0 % (ref 0.0–0.2)

## 2021-04-02 LAB — HEPATIC FUNCTION PANEL
ALT: 476 U/L — ABNORMAL HIGH (ref 0–44)
AST: 659 U/L — ABNORMAL HIGH (ref 15–41)
Albumin: 4 g/dL (ref 3.5–5.0)
Alkaline Phosphatase: 549 U/L — ABNORMAL HIGH (ref 38–126)
Bilirubin, Direct: 1.3 mg/dL — ABNORMAL HIGH (ref 0.0–0.2)
Indirect Bilirubin: 0.9 mg/dL (ref 0.3–0.9)
Total Bilirubin: 2.2 mg/dL — ABNORMAL HIGH (ref 0.3–1.2)
Total Protein: 8 g/dL (ref 6.5–8.1)

## 2021-04-02 LAB — BASIC METABOLIC PANEL
Anion gap: 11 (ref 5–15)
BUN: 9 mg/dL (ref 8–23)
CO2: 26 mmol/L (ref 22–32)
Calcium: 9.1 mg/dL (ref 8.9–10.3)
Chloride: 96 mmol/L — ABNORMAL LOW (ref 98–111)
Creatinine, Ser: 0.62 mg/dL (ref 0.44–1.00)
GFR, Estimated: >60 mL/min (ref >60)
Glucose, Bld: 221 mg/dL — ABNORMAL HIGH (ref 70–99)
Potassium: 3.8 mmol/L (ref 3.5–5.1)
Sodium: 133 mmol/L — ABNORMAL LOW (ref 135–145)

## 2021-04-02 LAB — CBG MONITORING, ED
Glucose-Capillary: 210 mg/dL — ABNORMAL HIGH (ref 70–99)
Glucose-Capillary: 147 mg/dL — ABNORMAL HIGH (ref 70–99)

## 2021-04-02 LAB — RESP PANEL BY RT-PCR (FLU A&B, COVID) ARPGX2
Influenza A by PCR: NEGATIVE
Influenza B by PCR: NEGATIVE
SARS Coronavirus 2 by RT PCR: NEGATIVE

## 2021-04-02 LAB — COMPREHENSIVE METABOLIC PANEL
ALT: 323 U/L — ABNORMAL HIGH (ref 0–44)
AST: 316 U/L — ABNORMAL HIGH (ref 15–41)
Albumin: 3.1 g/dL — ABNORMAL LOW (ref 3.5–5.0)
Alkaline Phosphatase: 435 U/L — ABNORMAL HIGH (ref 38–126)
Anion gap: 12 (ref 5–15)
BUN: 7 mg/dL — ABNORMAL LOW (ref 8–23)
CO2: 21 mmol/L — ABNORMAL LOW (ref 22–32)
Calcium: 7.8 mg/dL — ABNORMAL LOW (ref 8.9–10.3)
Chloride: 98 mmol/L (ref 98–111)
Creatinine, Ser: 0.59 mg/dL (ref 0.44–1.00)
GFR, Estimated: >60 mL/min (ref >60)
Glucose, Bld: 144 mg/dL — ABNORMAL HIGH (ref 70–99)
Potassium: 2.9 mmol/L — ABNORMAL LOW (ref 3.5–5.1)
Sodium: 131 mmol/L — ABNORMAL LOW (ref 135–145)
Total Bilirubin: 2.7 mg/dL — ABNORMAL HIGH (ref 0.3–1.2)
Total Protein: 6.5 g/dL (ref 6.5–8.1)

## 2021-04-02 LAB — LACTIC ACID, PLASMA: Lactic Acid, Venous: 0.6 mmol/L (ref 0.5–1.9)

## 2021-04-02 LAB — GLUCOSE, CAPILLARY: Glucose-Capillary: 154 mg/dL — ABNORMAL HIGH (ref 70–99)

## 2021-04-02 LAB — TROPONIN I (HIGH SENSITIVITY)
Troponin I (High Sensitivity): 8 ng/L (ref <18)
Troponin I (High Sensitivity): 7 ng/L (ref <18)

## 2021-04-02 LAB — LIPASE, BLOOD: Lipase: 39 U/L (ref 11–51)

## 2021-04-02 MED ORDER — IBUPROFEN 400 MG PO TABS: 400.0000 mg | ORAL_TABLET | Freq: Four times a day (QID) | ORAL | Status: DC | PRN

## 2021-04-02 MED ORDER — SODIUM CHLORIDE 0.9 % IV SOLN
1.0000 g | Freq: Once | INTRAVENOUS | Status: AC
  Administered 2021-04-02: 1000 mg via INTRAVENOUS
  Filled 2021-04-02: qty 1

## 2021-04-02 MED ORDER — SODIUM CHLORIDE 0.9 % IV BOLUS
1000.0000 mL | Freq: Once | INTRAVENOUS | Status: AC
  Administered 2021-04-02: 1000 mL via INTRAVENOUS

## 2021-04-02 MED ORDER — FENTANYL CITRATE PF 50 MCG/ML IJ SOSY
50.0000 ug | PREFILLED_SYRINGE | Freq: Once | INTRAMUSCULAR | Status: AC
  Administered 2021-04-02: 50 ug via INTRAVENOUS
  Filled 2021-04-02: qty 1

## 2021-04-02 MED ORDER — PANTOPRAZOLE SODIUM 40 MG IV SOLR
40.0000 mg | INTRAVENOUS | Status: DC
  Administered 2021-04-02: 40 mg via INTRAVENOUS
  Filled 2021-04-02: qty 40

## 2021-04-02 MED ORDER — MOMETASONE FURO-FORMOTEROL FUM 200-5 MCG/ACT IN AERO
1.0000 | INHALATION_SPRAY | Freq: Every day | RESPIRATORY_TRACT | Status: DC
  Administered 2021-04-02: 1 via RESPIRATORY_TRACT
  Filled 2021-04-02 (×2): qty 8.8

## 2021-04-02 MED ORDER — ONDANSETRON HCL 4 MG PO TABS
4.0000 mg | ORAL_TABLET | Freq: Four times a day (QID) | ORAL | Status: DC | PRN
  Administered 2021-04-02: 4 mg via ORAL

## 2021-04-02 MED ORDER — IBUPROFEN 400 MG PO TABS
600.0000 mg | ORAL_TABLET | Freq: Once | ORAL | Status: AC
  Administered 2021-04-02: 600 mg via ORAL
  Filled 2021-04-02: qty 2

## 2021-04-02 MED ORDER — ALBUTEROL SULFATE HFA 108 (90 BASE) MCG/ACT IN AERS: 2.0000 | INHALATION_SPRAY | Freq: Four times a day (QID) | RESPIRATORY_TRACT | Status: DC | PRN

## 2021-04-02 MED ORDER — HYDROMORPHONE HCL 1 MG/ML IJ SOLN
0.5000 mg | INTRAMUSCULAR | Status: DC | PRN
  Administered 2021-04-02: 0.5 mg via INTRAVENOUS
  Filled 2021-04-02: qty 1

## 2021-04-02 MED ORDER — GADOBUTROL 1 MMOL/ML IV SOLN
7.0000 mL | Freq: Once | INTRAVENOUS | Status: AC | PRN
  Administered 2021-04-02: 7 mL via INTRAVENOUS

## 2021-04-02 MED ORDER — ONDANSETRON HCL 4 MG/2ML IJ SOLN
4.0000 mg | Freq: Once | INTRAMUSCULAR | Status: AC | PRN
  Administered 2021-04-02: 4 mg via INTRAVENOUS
  Filled 2021-04-02: qty 2

## 2021-04-02 MED ORDER — POTASSIUM CHLORIDE CRYS ER 20 MEQ PO TBCR
40.0000 meq | EXTENDED_RELEASE_TABLET | Freq: Once | ORAL | Status: AC
  Administered 2021-04-02: 40 meq via ORAL
  Filled 2021-04-02: qty 2

## 2021-04-02 MED ORDER — ONDANSETRON HCL 4 MG/2ML IJ SOLN
4.0000 mg | Freq: Once | INTRAMUSCULAR | Status: AC
  Administered 2021-04-02: 4 mg via INTRAVENOUS
  Filled 2021-04-02: qty 2

## 2021-04-02 MED ORDER — CHLORHEXIDINE GLUCONATE CLOTH 2 % EX PADS: 6.0000 | MEDICATED_PAD | Freq: Every day | CUTANEOUS | Status: DC

## 2021-04-02 MED ORDER — ONDANSETRON HCL 4 MG/2ML IJ SOLN
4.0000 mg | Freq: Four times a day (QID) | INTRAMUSCULAR | Status: DC | PRN
  Filled 2021-04-02: qty 2

## 2021-04-02 MED ORDER — LORAZEPAM 2 MG/ML IJ SOLN: 0.5000 mg | Freq: Once | INTRAMUSCULAR | Status: DC | PRN

## 2021-04-02 MED ORDER — SODIUM CHLORIDE 0.9 % IV SOLN: INTRAVENOUS | Status: DC

## 2021-04-02 MED ORDER — LACTATED RINGERS IV BOLUS
1000.0000 mL | Freq: Once | INTRAVENOUS | Status: DC
  Filled 2021-04-02: qty 1000

## 2021-04-02 MED ORDER — ONDANSETRON HCL 4 MG/2ML IJ SOLN: 4.0000 mg | Freq: Four times a day (QID) | INTRAMUSCULAR | Status: DC | PRN

## 2021-04-02 MED ORDER — SODIUM CHLORIDE 0.9 % IV SOLN
1.0000 g | INTRAVENOUS | Status: DC
  Filled 2021-04-02 (×2): qty 1

## 2021-04-02 MED ORDER — IOHEXOL 300 MG/ML  SOLN
100.0000 mL | Freq: Once | INTRAMUSCULAR | Status: AC | PRN
  Administered 2021-04-02: 100 mL via INTRAVENOUS

## 2021-04-02 NOTE — ED Notes (Signed)
Pt to MRI via stretcher.

## 2021-04-02 NOTE — ED Notes (Signed)
Pt repositioned

## 2021-04-02 NOTE — ED Notes (Signed)
Getting upgrade on pyxis.

## 2021-04-02 NOTE — ED Notes (Signed)
Per Dr. Manuella Ghazi: Please fax her facesheet to [facility] [phone].Marland Kitchenthey will review her case and consider for transfer [facility] faxed as requested with confirmation of "Success".

## 2021-04-02 NOTE — ED Provider Notes (Signed)
[facility] EMERGENCY DEPARTMENT Provider Note   CSN: [id number] Arrival date & time: 04/02/21  2505     History Chief Complaint  Patient presents with   Chest Pain    Chest pain that started at 2 am.  Pain radiating to back, pain 10/10 and c/o nausea at 4 am.  Denies any other symptoms.      Betty Nguyen is a 80 y.o. female.  HPI 80 year old female presents with chest/abdominal pain and back pain.  She states this started around 2 AM.  She was in her bed but not asleep when it started and she was unable to sleep throughout the night.  She went to the bathroom for 5 bowel movements but states there was no blood in the stools not diarrhea.  She is been having some nausea and dry heaving.  The pain is severe and is about a 10 in her back.  It is a sharp pain that seems to go through to her back from her inferior chest/upper abdomen.  A few years ago she had a Whipple procedure at [facility] and states she has had pain similar to this that typically goes away if she applies ice and takes medicine to help her burp.  She tried to things and was able to burp but still the pain has not improved.  Past Medical History:  Diagnosis Date   Abnormal levels of other serum enzymes    Anemia, unspecified    Anxiety    Anxiety and depression    Anxiety disorder, unspecified    Asthma    Breast carcinoma ([place]) 08/2009   Invasive ductal; left; lumpectomy and sentinel node excision- oncology visits yearly.   Chest pain    Chronic obstructive pulmonary disease with (acute) exacerbation (HCC)    Depression    Diabetes mellitus type II, controlled ([place])    diet controlled,exercise, no meds   Diverticulitis 2012   Treated medically   Exocrine pancreatic insufficiency    Gastroesophageal reflux    Hyperlipidemia    Hyperlipidemia, unspecified    Incisional hernia without obstruction or gangrene    Lumbago with sciatica, unspecified side    Major depressive disorder, single episode, unspecified     Malignant neoplasm of unspecified site of unspecified female breast ([place])    Menopause    Mild persistent asthma with (acute) exacerbation    Mild persistent asthma, uncomplicated    Nephrolithiasis    Other acariasis    Other allergy, subsequent encounter    Other specified diseases of pancreas    Tick bite    [place] Tick, alpha gal positive, makes her allergic to meat   Type 2 diabetes mellitus without complications ([place])    Uterine prolapse     Patient Active Problem List   Diagnosis Date Noted   Acute pancreatitis 04/02/2021   Type 2 diabetes mellitus with hyperglycemia, with long-term current use of insulin ([place]) 09/06/2019   Sinusitis 05/19/2019   Herpes 04/28/2019   Menopause 04/27/2019   Malignant neoplasm of left female breast ([place]) 04/27/2019   Chronic obstructive pulmonary disease with (acute) exacerbation (HCC)    Hyperlipidemia, unspecified    Major depressive disorder, single episode, unspecified    Abnormal levels of other serum enzymes    Anemia, unspecified    Lumbago with sciatica, unspecified side    Type 2 diabetes mellitus without complications ([place])    Cholangitis 05/03/2018   Elevated LFTs    Acute cholangitis 05/02/2018   Depression 05/02/2018  Hyperlipidemia 05/02/2018   Hyponatremia 05/02/2018   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[place]) 01/15/2017   Exocrine pancreatic insufficiency 01/15/2017   Muscle weakness 11/01/2012   Calf pain 10/02/2012   Asthma 07/12/2012   Gastroesophageal reflux    Nephrolithiasis    Diverticulitis    Chest pain    Breast carcinoma ([place]) 08/26/2009    Past Surgical History:  Procedure Laterality Date   BREAST LUMPECTOMY     Left   CESAREAN SECTION     CHOLECYSTECTOMY     COLONOSCOPY  2004   DILATION AND CURETTAGE OF UTERUS     EUS N/A 09/07/2014   Procedure: UPPER ENDOSCOPIC ULTRASOUND (EUS) LINEAR;  Surgeon: Milus Banister, MD;  Location: [facility] ENDOSCOPY;  Service: Endoscopy;  Laterality:  N/A;   HERNIA REPAIR     NEPHROLITHOTOMY     TUBAL LIGATION     WHIPPLE PROCEDURE       OB History     Gravida  4   Para  2   Term  2   Preterm      AB      Living  2      SAB      IAB      Ectopic      Multiple      Live Births              Family History  Problem Relation Age of Onset   Coronary artery disease Mother    Stroke Father    Diabetes Mellitus II Father    Colon cancer Neg Hx     Social History   Tobacco Use   Smoking status: Never   Smokeless tobacco: Never  Vaping Use   Vaping Use: Never used  Substance Use Topics   Alcohol use: No   Drug use: No    Home Medications Prior to Admission medications   Medication Sig Start Date End Date Taking? Authorizing Provider  albuterol (PROVENTIL HFA;VENTOLIN HFA) 108 (90 BASE) MCG/ACT inhaler Inhale 2 puffs into the lungs every 6 (six) hours as needed for wheezing.    [provider]  Continuous Blood Gluc Receiver (DEXCOM G6 RECEIVER) DEVI 1 Device by Does not apply route as directed. 10/18/20   Shamleffer, Melanie Crazier, MD  Continuous Blood Gluc Receiver (FREESTYLE LIBRE READER) DEVI Use as instructed to check blood sugar daily 05/23/20   Shamleffer, Melanie Crazier, MD  Continuous Blood Gluc Sensor (DEXCOM G6 SENSOR) MISC 1 Device by Does not apply route as directed. 10/18/20   Shamleffer, Melanie Crazier, MD  Continuous Blood Gluc Sensor (FREESTYLE LIBRE 14 DAY SENSOR) MISC USE AS DIRECTED 11/12/20   Shamleffer, Melanie Crazier, MD  Continuous Blood Gluc Transmit (DEXCOM G6 TRANSMITTER) MISC 1 Device by Does not apply route as directed. 10/18/20   Shamleffer, Melanie Crazier, MD  dicyclomine (BENTYL) 10 MG capsule Take 40 mg by mouth 2 (two) times daily as needed. 05/08/20   [provider]  fluticasone furoate-vilanterol (BREO ELLIPTA) 200-25 MCG/INH AEPB Inhale 1 puff into the lungs daily.    [provider]  glucose blood (FREESTYLE PRECISION NEO TEST) test strip 1  each by Other route in the morning, at noon, in the evening, and at bedtime. Use as instructed 10/18/20   Shamleffer, Melanie Crazier, MD  HEMP OIL-VANILLYL BUTYL ETHER EX Apply topically.    [provider]  insulin glargine, 2 Unit Dial, (TOUJEO MAX SOLOSTAR) 300 UNIT/ML Solostar Pen Inject 8 Units into the skin  daily. 09/06/19   Shamleffer, Melanie Crazier, MD  insulin lispro (HUMALOG KWIKPEN) 100 UNIT/ML KwikPen MAX DAILY DOSE 30 UNITS. 02/08/21   Shamleffer, Melanie Crazier, MD  Insulin Pen Needle 32G X 4 MM MISC 1 Device by Does not apply route in the morning, at noon, in the evening, and at bedtime. 10/18/20   Shamleffer, Melanie Crazier, MD  Multiple Vitamin (ONE-DAILY MULTI-VITAMIN PO) Take by mouth.    [provider]  omeprazole (PRILOSEC) 40 MG capsule Take 1 capsule (40 mg total) by mouth daily. Take 1 capsule shortly before breakfast meal each day. 07/16/20   Milus Banister, MD  Pancrelipase, Lip-Prot-Amyl, (CREON) 24000-76000 units CPEP TAKE 1 CAPSULE WITH MEALS AND SNACKS UP TO 6 A DAY Patient taking differently: Take 1 capsule by mouth 3 (three) times daily with meals. TAKE 1 CAPSULE WITH MEALS AND SNACKS UP TO 6 A DAY 09/23/17   Cassandria Anger, MD  valACYclovir (VALTREX) 500 MG tablet Take 500 mg by mouth daily as needed (outbreak).     [provider]    Allergies    Amoxicillin, Biaxin [clarithromycin], Carafate [sucralfate], Codeine, Doxycycline, Meat [alpha-gal], Metronidazole, and Cephalosporins  Review of Systems   Review of Systems  Constitutional:  Negative for fever.  Respiratory:  Negative for shortness of breath.   Cardiovascular:  Positive for chest pain.  Gastrointestinal:  Positive for abdominal pain, nausea and vomiting. Negative for diarrhea.  Musculoskeletal:  Positive for back pain.  All other systems reviewed and are negative.  Physical Exam Updated Vital Signs BP (!) 153/74   Pulse (!) 130   Temp (!) 104.2 F (40.1 C)  (Rectal)   Resp (!) 32   Ht 5\' 2"  (1.575 m)   Wt 68 kg   SpO2 98%   BMI 27.44 kg/m   Physical Exam Vitals and nursing note reviewed.  Constitutional:      Appearance: She is well-developed.     Comments: Uncomfortable, appears in pain  HENT:     Head: Normocephalic and atraumatic.     Right Ear: External ear normal.     Left Ear: External ear normal.     Nose: Nose normal.  Eyes:     General:        Right eye: No discharge.        Left eye: No discharge.  Cardiovascular:     Rate and Rhythm: Normal rate and regular rhythm.     Heart sounds: Normal heart sounds.  Pulmonary:     Effort: Pulmonary effort is normal.     Breath sounds: Normal breath sounds.  Abdominal:     Palpations: Abdomen is soft.     Tenderness: There is abdominal tenderness in the right upper quadrant, epigastric area and left upper quadrant.  Musculoskeletal:     Comments: No back/CVA tenderness  Skin:    General: Skin is warm and dry.  Neurological:     Mental Status: She is alert.  Psychiatric:        Mood and Affect: Mood is not anxious.    ED Results / Procedures / Treatments   Labs (all labs ordered are listed, but only abnormal results are displayed) Labs Reviewed  BASIC METABOLIC PANEL - Abnormal; Notable for the following components:      Result Value   Sodium 133 (*)    Chloride 96 (*)    Glucose, Bld 221 (*)    All other components within normal limits  HEPATIC FUNCTION PANEL - Abnormal;  Notable for the following components:   AST 659 (*)    ALT 476 (*)    Alkaline Phosphatase 549 (*)    Total Bilirubin 2.2 (*)    Bilirubin, Direct 1.3 (*)    All other components within normal limits  CBG MONITORING, ED - Abnormal; Notable for the following components:   Glucose-Capillary 210 (*)    All other components within normal limits  RESP PANEL BY RT-PCR (FLU A&B, COVID) ARPGX2  CULTURE, BLOOD (ROUTINE X 2)  CULTURE, BLOOD (ROUTINE X 2)  CBC  LIPASE, BLOOD  LACTIC ACID, PLASMA   LACTIC ACID, PLASMA  TROPONIN I (HIGH SENSITIVITY)  TROPONIN I (HIGH SENSITIVITY)    EKG EKG Interpretation  Date/Time:  Tuesday April 02 2021 07:44:27 EST Ventricular Rate:  93 PR Interval:  116 QRS Duration: 80 QT Interval:  352 QTC Calculation: 438 R Axis:   67 Text Interpretation: Sinus rhythm Borderline short PR interval no acute ST/T changes overall similar to Jan 2020 Confirmed by Sherwood Gambler [phone]) on 04/02/2021 8:00:41 AM  Radiology DG Chest 2 View  Result Date: 04/02/2021 CLINICAL DATA:  Chest pain. EXAM: CHEST - 2 VIEW COMPARISON:  05/02/2018 FINDINGS: The cardiac silhouette, mediastinal and hilar contours are normal. The lungs are clear. No pleural effusions. No pulmonary lesions. No pneumothorax. The bony thorax is intact. IMPRESSION: No acute cardiopulmonary findings. Electronically Signed   By: Marijo Sanes M.D.   On: 04/02/2021 08:28   CT ABDOMEN PELVIS W CONTRAST  Result Date: 04/02/2021 CLINICAL DATA:  Central abdominal pain with nausea and vomiting beginning last night. EXAM: CT ABDOMEN AND PELVIS WITH CONTRAST TECHNIQUE: Multidetector CT imaging of the abdomen and pelvis was performed using the standard protocol following bolus administration of intravenous contrast. CONTRAST:  176mL OMNIPAQUE IOHEXOL 300 MG/ML  SOLN COMPARISON:  08/07/2020 FINDINGS: Lower chest: Mild atelectasis at the lung bases. No pleural or pericardial fluid. Hepatobiliary: Previous cholecystectomy. Chronic biliary prominence with pneumobilia. The common bile duct appears more prominent than was seen previously, measuring up to 11 mm in diameter. I do not see an obstructing distal stone or mass. Chronic 14 mm cyst within the left lobe of the liver. Pancreas: Apparent new dilatation of the pancreatic duct, also suggesting an obstructing process. Spleen: Normal except for a 1 cm peripheral low-density not likely to be significant. Adrenals/Urinary Tract: Adrenal glands are normal. Kidneys are  normal except for a small simple cyst on the right. No stone or hydronephrosis. Bladder is full but unremarkable. Stomach/Bowel: Stomach and small intestine are normal. Some fecalization in the distal small intestine, not likely significant. There is diverticulosis of the left colon but no visible diverticulitis. There is a caliber change in the proximal transverse colon. I do not see a definite mass in this location however. Vascular/Lymphatic: Aortic atherosclerosis. No aneurysm. IVC is normal. There is slight nodal prominence in the celiac region. Reproductive: No pelvic mass. Other: No free fluid or air. Musculoskeletal: Ordinary chronic degenerative changes affect the spine. IMPRESSION: Newly seen biliary and pancreatic ductal dilatation with some edema in the portal region. Question if this is due to pancreatitis, occult mass or pigment stone obstruction. MRCP would be suggested for further evaluation. Stool and gas in the right colon with fecalized distal small intestine contents. Caliber change of the proximal transverse colon, without definite identification of a mass. Correlate with colon cancer screening status. Consider appropriateness of colonoscopy. Electronically Signed   By: Nelson Chimes M.D.   On: 04/02/2021 09:49  Procedures Procedures   Medications Ordered in ED Medications  LORazepam (ATIVAN) injection 0.5 mg (has no administration in time range)  0.9 %  sodium chloride infusion (has no administration in time range)  ibuprofen (ADVIL) tablet 600 mg (has no administration in time range)  ondansetron (ZOFRAN) injection 4 mg (has no administration in time range)  HYDROmorphone (DILAUDID) injection 0.5 mg (has no administration in time range)  pantoprazole (PROTONIX) injection 40 mg (has no administration in time range)  ertapenem (INVANZ) 1,000 mg in sodium chloride 0.9 % 100 mL IVPB (has no administration in time range)  fentaNYL (SUBLIMAZE) injection 50 mcg (50 mcg Intravenous  Given 04/02/21 0854)  ondansetron (ZOFRAN) injection 4 mg (4 mg Intravenous Given 04/02/21 0853)  sodium chloride 0.9 % bolus 1,000 mL (0 mLs Intravenous Stopped 04/02/21 0950)  iohexol (OMNIPAQUE) 300 MG/ML solution 100 mL (100 mLs Intravenous Contrast Given 04/02/21 0906)  ondansetron (ZOFRAN) injection 4 mg (4 mg Intravenous Given 04/02/21 1035)  sodium chloride 0.9 % bolus 1,000 mL (1,000 mLs Intravenous New Bag/Given 04/02/21 1036)    ED Course  I have reviewed the triage vital signs and the nursing notes.  Pertinent labs & imaging results that were available during my care of the patient were reviewed by me and considered in my medical decision making (see chart for details).    MDM Rules/Calculators/A&P                           Patient's "chest pain" seems more abdominal.  CT scan has been personally reviewed and shows a nonspecific edema near her liver and pancreas.  Unclear if this is a pancreatitis versus choledocholithiasis.  She seemed to have some improvement in pain control but then started vomiting.  She was noted to be more tachycardic and tachypneic and found to have a rectal temp of 104.  Thus now I am concerned about cholangitis and so she was given a dose of ertapenem after discussion with pharmacy as this should both cover intra-abdominal pathology but also be okay with her penicillin allergies.  Ordered and admission. Dr. Manuella Ghazi to admit. Final Clinical Impression(s) / ED Diagnoses Final diagnoses:  Abnormal LFTs    Rx / DC Orders ED Discharge Orders     None        Sherwood Gambler, MD 04/02/21 1108

## 2021-04-02 NOTE — ED Notes (Signed)
Pt requested her blood sugar be checked.

## 2021-04-02 NOTE — H&P (Signed)
History and Physical    DINNA SEVERS EVO:[id number] DOB: 26-Nov-1940 DOA: 04/02/2021  PCP: Celene Squibb, MD   Patient coming from: Home  Chief Complaint: Chest/abd pain/nausea  HPI: Betty Nguyen is a 80 y.o. 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  This apparently started at about 2 AM this morning.  She had multiple bowel movements this morning, but this was not diarrhea, nor did she have any blood in her stools.  She has been dry heaving with nausea, but no vomiting.  She rates the pain as severe 10/10 and characterizes it as sharp.  She generally will feel better when she burps and places some ice on her abdomen as this kind of pain was reminiscent of when she had her Whipple procedure in 2016, but the pain has not improved.   ED Course: Vital signs demonstrating fever with temperature 104 Fahrenheit and tachycardia.  She appears to be in moderate to severe distress with pain ongoing.  CT of the abdomen pelvis demonstrating some biliary and pancreatic duct dilation and pancreatic edema suggestive of pancreatitis.  Lipase has not been elevated and is currently at 39.  Alkaline phosphatase 549, AST 659, and ALT 476.  Bilirubin 2.2.  EKG was sinus rhythm noted and chest x-ray with no findings.  Patient has been given 1 L fluid bolus and some pain medications in the ED.  MRCP ordered and pending.  Review of Systems: Reviewed as noted above, otherwise negative.  Past Medical History:  Diagnosis Date   Abnormal levels of other serum enzymes    Anemia, unspecified    Anxiety    Anxiety and depression    Anxiety disorder, unspecified    Asthma    Breast carcinoma ([place]) 08/2009   Invasive ductal; left; lumpectomy and sentinel node  excision- oncology visits yearly.   Chest pain    Chronic obstructive pulmonary disease with (acute) exacerbation (HCC)    Depression    Diabetes mellitus type II, controlled ([place])    diet controlled,exercise, no meds   Diverticulitis 2012   Treated medically   Exocrine pancreatic insufficiency    Gastroesophageal reflux    Hyperlipidemia    Hyperlipidemia, unspecified    Incisional hernia without obstruction or gangrene    Lumbago with sciatica, unspecified side    Major depressive disorder, single episode, unspecified    Malignant neoplasm of unspecified site of unspecified female breast ([place])    Menopause    Mild persistent asthma with (acute) exacerbation    Mild persistent asthma, uncomplicated    Nephrolithiasis    Other acariasis    Other allergy, subsequent encounter    Other specified diseases of pancreas    Tick bite    [place] Tick, alpha gal positive, makes her allergic to meat   Type 2 diabetes mellitus without complications ([place])    Uterine prolapse     Past Surgical History:  Procedure Laterality Date   BREAST LUMPECTOMY     Left   CESAREAN SECTION     CHOLECYSTECTOMY     COLONOSCOPY  2004   DILATION AND CURETTAGE OF UTERUS     EUS N/A 09/07/2014   Procedure: UPPER ENDOSCOPIC ULTRASOUND (EUS) LINEAR;  Surgeon: Milus Banister, MD;  Location: [facility];  Service: Endoscopy;  Laterality: N/A;   HERNIA REPAIR     NEPHROLITHOTOMY     TUBAL LIGATION     WHIPPLE PROCEDURE       reports that she has never smoked. She has never used smokeless tobacco. She reports that she does not drink alcohol and does not use drugs.  Allergies  Allergen Reactions   Amoxicillin    Biaxin [Clarithromycin] Other (See Comments)    Hallucinations   Carafate [Sucralfate] Other (See Comments)    Flared up her vertigo, run her blood sugar up and caused constipation   Codeine Other (See Comments)    Hallucinations    Doxycycline Other (See Comments)    GI upset , turned  stool orange   Meat [Alpha-Gal]     RED MEAT    Metronidazole Other (See Comments)    Heart Palpations -flagyl   Cephalosporins Hives and Palpitations    Family History  Problem Relation Age of Onset   Coronary artery disease Mother    Stroke Father    Diabetes Mellitus II Father    Colon cancer Neg Hx     Prior to Admission medications   Medication Sig Start Date End Date Taking? Authorizing Provider  albuterol (PROVENTIL HFA;VENTOLIN HFA) 108 (90 BASE) MCG/ACT inhaler Inhale 2 puffs into the lungs every 6 (six) hours as needed for wheezing.    [provider]  Continuous Blood Gluc Receiver (DEXCOM G6 RECEIVER) DEVI 1 Device by Does not apply route as directed. 10/18/20   Shamleffer, Melanie Crazier, MD  Continuous Blood Gluc Receiver (FREESTYLE LIBRE READER) DEVI Use as instructed to check blood sugar daily 05/23/20   Shamleffer, Melanie Crazier, MD  Continuous Blood Gluc Sensor (DEXCOM G6 SENSOR) MISC 1 Device by Does not apply route as directed. 10/18/20   Shamleffer, Melanie Crazier, MD  Continuous Blood Gluc Sensor (FREESTYLE LIBRE 14 DAY SENSOR) MISC USE AS DIRECTED 11/12/20   Shamleffer, Melanie Crazier, MD  Continuous Blood Gluc Transmit (DEXCOM G6 TRANSMITTER) MISC 1 Device by Does not apply route as directed. 10/18/20   Shamleffer, Melanie Crazier, MD  dicyclomine (BENTYL) 10 MG capsule Take 40 mg by mouth 2 (two) times daily as needed. 05/08/20   [provider]  fluticasone furoate-vilanterol (BREO ELLIPTA) 200-25 MCG/INH AEPB Inhale 1 puff into the lungs daily.    [provider]  glucose blood (FREESTYLE PRECISION NEO TEST) test strip 1 each by Other route in the morning, at noon, in the evening, and at bedtime. Use as instructed 10/18/20   Shamleffer, Melanie Crazier, MD  HEMP OIL-VANILLYL BUTYL ETHER EX Apply topically.    [provider]  insulin glargine, 2 Unit Dial, (TOUJEO MAX SOLOSTAR) 300 UNIT/ML Solostar Pen Inject 8 Units into the  skin daily. 09/06/19   Shamleffer, Melanie Crazier, MD  insulin lispro (HUMALOG KWIKPEN) 100 UNIT/ML KwikPen MAX DAILY DOSE 30 UNITS. 02/08/21   Shamleffer, Melanie Crazier, MD  Insulin Pen Needle 32G X 4 MM MISC 1 Device by Does not apply route in the morning, at noon, in the evening, and at bedtime. 10/18/20   Shamleffer, Melanie Crazier, MD  Multiple Vitamin (ONE-DAILY MULTI-VITAMIN PO) Take by mouth.    [provider]  omeprazole (PRILOSEC) 40 MG capsule Take 1 capsule (40 mg total) by mouth daily. Take 1 capsule shortly before breakfast meal each day. 07/16/20   Milus Banister, MD  Pancrelipase, Lip-Prot-Amyl, (CREON) 24000-76000 units CPEP TAKE 1 CAPSULE WITH MEALS AND SNACKS UP TO 6 A DAY  Patient taking differently: Take 1 capsule by mouth 3 (three) times daily with meals. TAKE 1 CAPSULE WITH MEALS AND SNACKS UP TO 6 A DAY 09/23/17   Cassandria Anger, MD  valACYclovir (VALTREX) 500 MG tablet Take 500 mg by mouth daily as needed (outbreak).     [provider]    Physical Exam: Vitals:   04/02/21 0934 04/02/21 1000 04/02/21 1030 04/02/21 1054  BP:  (!) 155/79 (!) 153/74   Pulse:   (!) 130   Resp:  (!) 35 (!) 32   Temp: 100.3 F (37.9 C)   (!) 104.2 F (40.1 C)  TempSrc:    Rectal  SpO2:  91% 98%   Weight:      Height:        Constitutional: Moderate to severe distress Vitals:   04/02/21 0934 04/02/21 1000 04/02/21 1030 04/02/21 1054  BP:  (!) 155/79 (!) 153/74   Pulse:   (!) 130   Resp:  (!) 35 (!) 32   Temp: 100.3 F (37.9 C)   (!) 104.2 F (40.1 C)  TempSrc:    Rectal  SpO2:  91% 98%   Weight:      Height:       Eyes: lids and conjunctivae normal Neck: normal, supple Respiratory: clear to auscultation bilaterally. Normal respiratory effort. No accessory muscle use.  Currently on 2 L nasal cannula oxygen Cardiovascular: Regular rate and rhythm, no murmurs.  Tachycardic. Abdomen: Tender to palpation of epigastric region, no distention. Bowel  sounds positive.  Musculoskeletal:  No edema. Skin: no rashes, lesions, ulcers.  Psychiatric: Flat affect  Labs on Admission: I have personally reviewed following labs and imaging studies  CBC: Recent Labs  Lab 04/02/21 0758  WBC 9.9  HGB 13.4  HCT 40.4  MCV 94.6  PLT 875   Basic Metabolic Panel: Recent Labs  Lab 04/02/21 0758  NA 133*  K 3.8  CL 96*  CO2 26  GLUCOSE 221*  BUN 9  CREATININE 0.62  CALCIUM 9.1   GFR: Estimated Creatinine Clearance: 50.7 mL/min (by C-G formula based on SCr of 0.62 mg/dL). Liver Function Tests: Recent Labs  Lab 04/02/21 0758  AST 659*  ALT 476*  ALKPHOS 549*  BILITOT 2.2*  PROT 8.0  ALBUMIN 4.0   Recent Labs  Lab 04/02/21 0758  LIPASE 39   No results for input(s): AMMONIA in the last 168 hours. Coagulation Profile: No results for input(s): INR, PROTIME in the last 168 hours. Cardiac Enzymes: No results for input(s): CKTOTAL, CKMB, CKMBINDEX, TROPONINI in the last 168 hours. BNP (last 3 results) No results for input(s): PROBNP in the last 8760 hours. HbA1C: No results for input(s): HGBA1C in the last 72 hours. CBG: Recent Labs  Lab 04/02/21 0947  GLUCAP 210*   Lipid Profile: No results for input(s): CHOL, HDL, LDLCALC, TRIG, CHOLHDL, LDLDIRECT in the last 72 hours. Thyroid Function Tests: No results for input(s): TSH, T4TOTAL, FREET4, T3FREE, THYROIDAB in the last 72 hours. Anemia Panel: No results for input(s): VITAMINB12, FOLATE, FERRITIN, TIBC, IRON, RETICCTPCT in the last 72 hours. Urine analysis:    Component Value Date/Time   COLORURINE YELLOW 05/02/2018 1909   APPEARANCEUR CLEAR 05/02/2018 1909   LABSPEC 1.005 05/02/2018 1909   PHURINE 6.0 05/02/2018 1909   GLUCOSEU NEGATIVE 05/02/2018 1909   HGBUR MODERATE (A) 05/02/2018 1909   BILIRUBINUR NEGATIVE 05/02/2018 1909   KETONESUR 5 (A) 05/02/2018 1909   PROTEINUR NEGATIVE 05/02/2018 1909   UROBILINOGEN 0.2 02/04/2015 1255  NITRITE NEGATIVE 05/02/2018  1909   LEUKOCYTESUR NEGATIVE 05/02/2018 1909    Radiological Exams on Admission: DG Chest 2 View  Result Date: 04/02/2021 CLINICAL DATA:  Chest pain. EXAM: CHEST - 2 VIEW COMPARISON:  05/02/2018 FINDINGS: The cardiac silhouette, mediastinal and hilar contours are normal. The lungs are clear. No pleural effusions. No pulmonary lesions. No pneumothorax. The bony thorax is intact. IMPRESSION: No acute cardiopulmonary findings. Electronically Signed   By: Marijo Sanes M.D.   On: 04/02/2021 08:28   CT ABDOMEN PELVIS W CONTRAST  Result Date: 04/02/2021 CLINICAL DATA:  Central abdominal pain with nausea and vomiting beginning last night. EXAM: CT ABDOMEN AND PELVIS WITH CONTRAST TECHNIQUE: Multidetector CT imaging of the abdomen and pelvis was performed using the standard protocol following bolus administration of intravenous contrast. CONTRAST:  140mL OMNIPAQUE IOHEXOL 300 MG/ML  SOLN COMPARISON:  08/07/2020 FINDINGS: Lower chest: Mild atelectasis at the lung bases. No pleural or pericardial fluid. Hepatobiliary: Previous cholecystectomy. Chronic biliary prominence with pneumobilia. The common bile duct appears more prominent than was seen previously, measuring up to 11 mm in diameter. I do not see an obstructing distal stone or mass. Chronic 14 mm cyst within the left lobe of the liver. Pancreas: Apparent new dilatation of the pancreatic duct, also suggesting an obstructing process. Spleen: Normal except for a 1 cm peripheral low-density not likely to be significant. Adrenals/Urinary Tract: Adrenal glands are normal. Kidneys are normal except for a small simple cyst on the right. No stone or hydronephrosis. Bladder is full but unremarkable. Stomach/Bowel: Stomach and small intestine are normal. Some fecalization in the distal small intestine, not likely significant. There is diverticulosis of the left colon but no visible diverticulitis. There is a caliber change in the proximal transverse colon. I do not see  a definite mass in this location however. Vascular/Lymphatic: Aortic atherosclerosis. No aneurysm. IVC is normal. There is slight nodal prominence in the celiac region. Reproductive: No pelvic mass. Other: No free fluid or air. Musculoskeletal: Ordinary chronic degenerative changes affect the spine. IMPRESSION: Newly seen biliary and pancreatic ductal dilatation with some edema in the portal region. Question if this is due to pancreatitis, occult mass or pigment stone obstruction. MRCP would be suggested for further evaluation. Stool and gas in the right colon with fecalized distal small intestine contents. Caliber change of the proximal transverse colon, without definite identification of a mass. Correlate with colon cancer screening status. Consider appropriateness of colonoscopy. Electronically Signed   By: Nelson Chimes M.D.   On: 04/02/2021 09:49    EKG: Independently reviewed. SR 93bpm.  Assessment/Plan Principal Problem:   Acute pancreatitis    Fever and epigastric pain suggestive of acute pancreatitis with potential choledocholithiasis -MRCP ordered and pending -Continue aggressive IV fluid after 1 more liter bolus -Keep n.p.o. -Pain management with Dilaudid -Zofran ordered as needed for nausea and vomiting -Start antibiotic coverage with ertapenem due to penicillin allergy -Monitor transaminitis -IV PPI -GI consultation appreciated  Insulin-dependent type 2 diabetes -Currently with hyperglycemia -Continue on SSI -Avoid long-acting insulin while n.p.o.  History of asthma -No acute bronchospasms currently -Breathing treatments as needed  Prior history of Whipple procedure in 2016 due to mucinous cystic neoplasm of pancreas -Noted to have multiple cyst on pancreas currently   DVT prophylaxis: SCDs Code Status: Full Family Communication: Husband at bedside Disposition Plan:Admit for eval/tx of pancreatitis Consults called:GI Admission status:Inpatient, SDU   [place] President D Eliezer Khawaja  DO [facility]  If 7PM-7AM, please contact night-coverage [email or web address]  04/02/2021,  11:08 AM

## 2021-04-02 NOTE — ED Notes (Signed)
Purewick was replaced and hooked back up by this NT

## 2021-04-02 NOTE — Consult Note (Signed)
_0 @   Referring Provider: [facility] Primary Care Physician:  Celene Squibb, MD Primary Gastroenterologist:  Dr. Ardis Hughs  Date of Admission: 04/02/21 Date of Consultation: 04/02/21  Reason for Consultation:  Elevated LFTs/bilirubin, fever, abnormal CT scan of biliary tree  HPI:  Betty Nguyen is a 80 y.o. year old female with history of GERD, pancreatic cyst, mucinous pancreatic neoplasm s/p Whipple surgery with Dr. Birdie Sons in 2016, insulin-dependent type 2 diabetes, breast cancer, who presented to the ED with sudden onset chest and epigastric abdominal pain with radiation into her back that started around 2 AM this morning.  Associated dry heaving with nausea, but no vomiting.  ED Course: Vital signs demonstrating fever with temperature 104 Fahrenheit and tachycardia.  She appears to be in moderate to severe distress with pain ongoing.  CBC within normal limits, mild hyponatremia, LFTs elevated with AST 659, ALT 476, alk phos 549, total bilirubin 2.2, direct bilirubin 1.3, lipase 39. Troponin 7. Respiratory panel negative. Lactic acid 0.6. CT A/P with contrast with chronic biliary prominence with pneumobilia, increased CBD and new pancreatic duct dilation with some edema in the portal region with question of pancreatitis, occult mass, or pigment stone obstruction. Also with caliber change in proximal transverse colon with no definite mass.  MRI/MRCP w/w/o:  Mild diffuse biliary ductal dilatation, with abrupt stricture in mid common bile duct. No obstructing calculus or mass visualized.   Moderate dilatation of the main pancreatic duct with stricture in the pancreatic neck. No evidence of pancreatic mass. Consider ERCP/EUS for further evaluation.  Numerous tiny sub-cm cystic foci are seen throughout the pancreatic body and tail, which may be due to chronic pancreatitis or indolent cystic pancreatic neoplasms.  Consult: Patient reports acute onset epigastric  abdominal pain radiating to her back this morning at 2 AM.  Pain was severe and she could not find anything to improve her pain.  Associated nausea and dry heaves without vomiting.  Also had about 5 formed bowel movements between 2 AM and 7 AM without BRBPR or melena.  She reports she had been having mild, intermittent epigastric pain for the last several days, sometimes after eating which was improved with belching or putting something cold or hot on her abdomen.  Currently, abdominal pain is much improved with pain medications.  She has chronic history of GERD is fairly well controlled with omeprazole 40 mg daily and Pepcid AC as needed.  She also chronically takes Creon which keeps her bowels normal.  Usually, she has 1-2 BMs per day.  Denies any unintentional weight loss.  States she has actually been gaining weight recently.    Per review of prior outpatient GI notes, Dr. Ardis Hughs has stated with her Whipple anatomy, ERCP would not be possible with him.  Last colonoscopy pain 12/29/2012: Moderate diverticulosis in descending and sigmoid colon, sessile polyp ranging 3-5 mm in rectum s/p polypectomy.  Pathology revealed tubular adenoma.  Recommended repeat in 5 years.  Past Medical History:  Diagnosis Date   Abnormal levels of other serum enzymes    Anemia, unspecified    Anxiety    Anxiety and depression    Anxiety disorder, unspecified    Asthma    Breast carcinoma ([place]) 08/2009   Invasive ductal; left; lumpectomy and sentinel node excision- oncology visits yearly.   Chest pain    Chronic obstructive pulmonary disease with (acute) exacerbation (HCC)    Depression    Diabetes mellitus type II, controlled ([place])    diet controlled,exercise,  no meds   Diverticulitis 2012   Treated medically   Exocrine pancreatic insufficiency    Gastroesophageal reflux    Hyperlipidemia    Hyperlipidemia, unspecified    Incisional hernia without obstruction or gangrene    Lumbago with sciatica, unspecified  side    Major depressive disorder, single episode, unspecified    Malignant neoplasm of unspecified site of unspecified female breast ([place])    Menopause    Mild persistent asthma with (acute) exacerbation    Mild persistent asthma, uncomplicated    Nephrolithiasis    Other acariasis    Other allergy, subsequent encounter    Other specified diseases of pancreas    Tick bite    [place] Tick, alpha gal positive, makes her allergic to meat   Type 2 diabetes mellitus without complications ([place])    Uterine prolapse     Past Surgical History:  Procedure Laterality Date   BREAST LUMPECTOMY     Left   CESAREAN SECTION     CHOLECYSTECTOMY     COLONOSCOPY  2004   DILATION AND CURETTAGE OF UTERUS     EUS N/A 09/07/2014   Procedure: UPPER ENDOSCOPIC ULTRASOUND (EUS) LINEAR;  Surgeon: Milus Banister, MD;  Location: [facility];  Service: Endoscopy;  Laterality: N/A;   HERNIA REPAIR     NEPHROLITHOTOMY     TUBAL LIGATION     WHIPPLE PROCEDURE      Prior to Admission medications   Medication Sig Start Date End Date Taking? Authorizing Provider  albuterol (PROVENTIL HFA;VENTOLIN HFA) 108 (90 BASE) MCG/ACT inhaler Inhale 2 puffs into the lungs every 6 (six) hours as needed for wheezing.   Yes [provider]  dicyclomine (BENTYL) 10 MG capsule Take 40 mg by mouth 2 (two) times daily as needed. 05/08/20  Yes [provider]  fluticasone furoate-vilanterol (BREO ELLIPTA) 200-25 MCG/INH AEPB Inhale 1 puff into the lungs daily.   Yes [provider]  guaiFENesin (MUCINEX) 600 MG 12 hr tablet Take 1,200 mg by mouth 2 (two) times daily.   Yes [provider]  insulin glargine, 2 Unit Dial, (TOUJEO MAX SOLOSTAR) 300 UNIT/ML Solostar Pen Inject 8 Units into the skin daily. 09/06/19  Yes Shamleffer, Melanie Crazier, MD  insulin lispro (HUMALOG KWIKPEN) 100 UNIT/ML KwikPen MAX DAILY DOSE 30 UNITS. 02/08/21  Yes Shamleffer, Melanie Crazier, MD  omeprazole (PRILOSEC) 40  MG capsule Take 1 capsule (40 mg total) by mouth daily. Take 1 capsule shortly before breakfast meal each day. 07/16/20  Yes Milus Banister, MD  Pancrelipase, Lip-Prot-Amyl, (CREON) 24000-76000 units CPEP TAKE 1 CAPSULE WITH MEALS AND SNACKS UP TO 6 A DAY Patient taking differently: Take 1 capsule by mouth 3 (three) times daily with meals. TAKE 1 CAPSULE WITH MEALS AND SNACKS UP TO 6 A DAY 09/23/17  Yes Nida, Marella Chimes, MD  valACYclovir (VALTREX) 500 MG tablet Take 500 mg by mouth daily as needed (outbreak).    Yes [provider]  Multiple Vitamin (ONE-DAILY MULTI-VITAMIN PO) Take by mouth. Patient not taking: Reported on 04/02/2021    [provider]    Current Facility-Administered Medications  Medication Dose Route Frequency Provider Last Rate Last Admin   0.9 %  sodium chloride infusion   Intravenous Continuous Heath Lark D, DO 150 mL/hr at 04/02/21 1215 New Bag at 04/02/21 1215   albuterol (VENTOLIN HFA) 108 (90 Base) MCG/ACT inhaler 2 puff  2 puff Inhalation Q6H PRN Rodena Goldmann, DO       [  START ON 04/03/2021] ertapenem (INVANZ) 1,000 mg in sodium chloride 0.9 % 100 mL IVPB  1 g Intravenous Q24H Manuella Ghazi, Pratik D, DO       HYDROmorphone (DILAUDID) injection 0.5 mg  0.5 mg Intravenous Q2H PRN Manuella Ghazi, Pratik D, DO       ibuprofen (ADVIL) tablet 400 mg  400 mg Oral Q6H PRN Manuella Ghazi, Pratik D, DO       LORazepam (ATIVAN) injection 0.5 mg  0.5 mg Intravenous Once PRN Manuella Ghazi, Pratik D, DO       mometasone-formoterol (DULERA) 200-5 MCG/ACT inhaler 1 puff  1 puff Inhalation Daily Manuella Ghazi, Pratik D, DO       ondansetron (ZOFRAN) injection 4 mg  4 mg Intravenous Q6H PRN Manuella Ghazi, Pratik D, DO       ondansetron (ZOFRAN) tablet 4 mg  4 mg Oral Q6H PRN Manuella Ghazi, Pratik D, DO       Or   ondansetron (ZOFRAN) injection 4 mg  4 mg Intravenous Q6H PRN Manuella Ghazi, Pratik D, DO       pantoprazole (PROTONIX) injection 40 mg  40 mg Intravenous Q24H Shah, Pratik D, DO   40 mg at 04/02/21 1215   Current  Outpatient Medications  Medication Sig Dispense Refill   albuterol (PROVENTIL HFA;VENTOLIN HFA) 108 (90 BASE) MCG/ACT inhaler Inhale 2 puffs into the lungs every 6 (six) hours as needed for wheezing.     dicyclomine (BENTYL) 10 MG capsule Take 40 mg by mouth 2 (two) times daily as needed.     fluticasone furoate-vilanterol (BREO ELLIPTA) 200-25 MCG/INH AEPB Inhale 1 puff into the lungs daily.     guaiFENesin (MUCINEX) 600 MG 12 hr tablet Take 1,200 mg by mouth 2 (two) times daily.     insulin glargine, 2 Unit Dial, (TOUJEO MAX SOLOSTAR) 300 UNIT/ML Solostar Pen Inject 8 Units into the skin daily. 15 mL 6   insulin lispro (HUMALOG KWIKPEN) 100 UNIT/ML KwikPen MAX DAILY DOSE 30 UNITS. 30 mL 3   omeprazole (PRILOSEC) 40 MG capsule Take 1 capsule (40 mg total) by mouth daily. Take 1 capsule shortly before breakfast meal each day. 30 capsule 11   Pancrelipase, Lip-Prot-Amyl, (CREON) 24000-76000 units CPEP TAKE 1 CAPSULE WITH MEALS AND SNACKS UP TO 6 A DAY (Patient taking differently: Take 1 capsule by mouth 3 (three) times daily with meals. TAKE 1 CAPSULE WITH MEALS AND SNACKS UP TO 6 A DAY) 180 capsule 2   valACYclovir (VALTREX) 500 MG tablet Take 500 mg by mouth daily as needed (outbreak).      Multiple Vitamin (ONE-DAILY MULTI-VITAMIN PO) Take by mouth. (Patient not taking: Reported on 04/02/2021)      Allergies as of 04/02/2021 - Review Complete 04/02/2021  Allergen Reaction Noted   Amoxicillin  03/15/2019   Biaxin [clarithromycin] Other (See Comments) 11/25/2012   Carafate [sucralfate] Other (See Comments) 10/07/2016   Codeine Other (See Comments) 08/21/2010   Doxycycline Other (See Comments) 10/07/2016   Meat [alpha-gal]  05/02/2018   Metronidazole Other (See Comments) 11/25/2012   Cephalosporins Hives and Palpitations 11/25/2012    Family History  Problem Relation Age of Onset   Coronary artery disease Mother    Stroke Father    Diabetes Mellitus II Father    Colon cancer Neg Hx      Social History   Socioeconomic History   Marital status: Married    Spouse name: Not on file   Number of children: 2   Years of education: Not on file   Highest  education level: Not on file  Occupational History   Occupation: Tourist information centre manager for [employer]  Tobacco Use   Smoking status: Never   Smokeless tobacco: Never  Vaping Use   Vaping Use: Never used  Substance and Sexual Activity   Alcohol use: No   Drug use: No   Sexual activity: Not Currently  Other Topics Concern   Not on file  Social History Narrative   Not on file   Social Determinants of Health   Financial Resource Strain: Not on file  Food Insecurity: Not on file  Transportation Needs: Not on file  Physical Activity: Not on file  Stress: Not on file  Social Connections: Not on file  Intimate Partner Violence: Not on file    Review of Systems: Gen: She admits to fever this morning, now resolved.  Denies lightheadedness, dizziness, presyncope, syncope. CV: Denies chest pain, heart palpitations. Resp: Feels she cannot take a complete deep breath, but no shortness of breath or cough.  Currently on 2 L nasal cannula but does not use oxygen at home. GI: See HPI GU : Denies urinary burning, urinary frequency, urinary incontinence.  MS: Denies joint pain. Derm: Denies rash.  Psych: Denies depression, anxiety. Heme: See HPI  Physical Exam: Vital signs in last 24 hours: Temp:  [98.4 F (36.9 C)-104.2 F (40.1 C)] 104.2 F (40.1 C) (12/06 1054) Pulse Rate:  [92-130] 105 (12/06 1100) Resp:  [20-39] 39 (12/06 1100) BP: (111-170)/(40-91) 146/72 (12/06 1100) SpO2:  [90 %-99 %] 92 % (12/06 1125) FiO2 (%):  [28 %] 28 % (12/06 1125) Weight:  [68 kg] 68 kg (12/06 0746)   General:   Alert,  well-developed, well-nourished, pleasant and cooperative in NAD Head:  Normocephalic and atraumatic. Eyes:  Sclera clear, no icterus.   Conjunctiva pink. Ears: Decreased auditory acuity. Lungs:  Clear throughout to auscultation.   No  wheezes, crackles, or rhonchi. No acute distress. Heart:  Regular rate and rhythm; no murmurs, clicks, rubs,  or gallops. Abdomen:  Soft, nontender and nondistended. No masses, hepatosplenomegaly or hernias noted. Normal bowel sounds, without guarding, and without rebound.   Rectal:  Deferred  Msk:  Symmetrical without gross deformities. Normal posture. Extremities:  Without edema. Neurologic:  Alert and  oriented x4;  grossly normal neurologically. Skin:  Intact without significant lesions or rashes. Psych:  Normal mood and affect.  Intake/Output from previous day: No intake/output data recorded. Intake/Output this shift: Total I/O In: 1000 [IV Piggyback:1000] Out: -   Lab Results: Recent Labs    04/02/21 0758  WBC 9.9  HGB 13.4  HCT 40.4  PLT 197   BMET Recent Labs    04/02/21 0758  NA 133*  K 3.8  CL 96*  CO2 26  GLUCOSE 221*  BUN 9  CREATININE 0.62  CALCIUM 9.1   LFT Recent Labs    04/02/21 0758  PROT 8.0  ALBUMIN 4.0  AST 659*  ALT 476*  ALKPHOS 549*  BILITOT 2.2*  BILIDIR 1.3*  IBILI 0.9    Studies/Results: DG Chest 2 View  Result Date: 04/02/2021 CLINICAL DATA:  Chest pain. EXAM: CHEST - 2 VIEW COMPARISON:  05/02/2018 FINDINGS: The cardiac silhouette, mediastinal and hilar contours are normal. The lungs are clear. No pleural effusions. No pulmonary lesions. No pneumothorax. The bony thorax is intact. IMPRESSION: No acute cardiopulmonary findings. Electronically Signed   By: Marijo Sanes M.D.   On: 04/02/2021 08:28   CT ABDOMEN PELVIS W CONTRAST  Result Date: 04/02/2021 CLINICAL DATA:  Central abdominal pain with nausea and vomiting beginning last night. EXAM: CT ABDOMEN AND PELVIS WITH CONTRAST TECHNIQUE: Multidetector CT imaging of the abdomen and pelvis was performed using the standard protocol following bolus administration of intravenous contrast. CONTRAST:  19m OMNIPAQUE IOHEXOL 300 MG/ML  SOLN COMPARISON:  08/07/2020 FINDINGS: Lower chest:  Mild atelectasis at the lung bases. No pleural or pericardial fluid. Hepatobiliary: Previous cholecystectomy. Chronic biliary prominence with pneumobilia. The common bile duct appears more prominent than was seen previously, measuring up to 11 mm in diameter. I do not see an obstructing distal stone or mass. Chronic 14 mm cyst within the left lobe of the liver. Pancreas: Apparent new dilatation of the pancreatic duct, also suggesting an obstructing process. Spleen: Normal except for a 1 cm peripheral low-density not likely to be significant. Adrenals/Urinary Tract: Adrenal glands are normal. Kidneys are normal except for a small simple cyst on the right. No stone or hydronephrosis. Bladder is full but unremarkable. Stomach/Bowel: Stomach and small intestine are normal. Some fecalization in the distal small intestine, not likely significant. There is diverticulosis of the left colon but no visible diverticulitis. There is a caliber change in the proximal transverse colon. I do not see a definite mass in this location however. Vascular/Lymphatic: Aortic atherosclerosis. No aneurysm. IVC is normal. There is slight nodal prominence in the celiac region. Reproductive: No pelvic mass. Other: No free fluid or air. Musculoskeletal: Ordinary chronic degenerative changes affect the spine. IMPRESSION: Newly seen biliary and pancreatic ductal dilatation with some edema in the portal region. Question if this is due to pancreatitis, occult mass or pigment stone obstruction. MRCP would be suggested for further evaluation. Stool and gas in the right colon with fecalized distal small intestine contents. Caliber change of the proximal transverse colon, without definite identification of a mass. Correlate with colon cancer screening status. Consider appropriateness of colonoscopy. Electronically Signed   By: MNelson ChimesM.D.   On: 04/02/2021 09:49    Impression:  80y.o. year old female with history of GERD, pancreatic cyst,  mucinous pancreatic neoplasm s/p Whipple surgery with Dr. RBirdie Sonsin 2016, insulin-dependent type 2 diabetes, breast cancer, who presented to the ED with sudden onset chest and epigastric abdominal pain radiating to her back with associated nausea and dry heaving without vomiting.  Notably, she has been having similar, but much milder symptoms over the last several days. She was found to be tachycardic with a fever of 104 Fahrenheit in the emergency room.  Significantly elevated LFTs with AST 659, ALT 476, alk phos 549, total bilirubin 2.2, direct bilirubin 1.3, lipase 39.  CT A/P with contrast with increased CBD and new pancreatic duct dilation with some edema in the portal region concerning for pancreatitis versus occult mass versus pigment stone obstruction.  Also with caliber change in the proximal transverse colon with no definite mass.  Follow-up MRI with mild diffuse biliary ductal dilation with abrupt stricture in the mid common bile duct and moderate dilation of the pancreatic duct with stricture in the pancreatic neck.  No evidence of obstructing calculus or mass.  Also with numerous tiny subcentimeter pancreatic cysts which may be due to chronic pancreatitis or indolent cystic pancreatic neoplasms.   Clinically, she is presenting with cholangitis in the setting of CBD and pancreatic stricture.  May have a component of pancreatitis with reports of mild edema, but lipase is normal. She needs ERCP/EUS for further evaluation and intervention.  Unfortunately, due to altered anatomy in the setting of  Whipple, ERCP is not able to be completed locally and she will need transfer to a tertiary care center.  As an aside, she also likely needs a colonoscopy which could be completed in the outpatient setting to follow-up on colon abnormalities on CT.  Her last colonoscopy was in September 2014 with 1 tubular adenoma and moderate diverticulosis in the descending and sigmoid colon.  Have recommended  repeating in 5 years.  GERD: Chronic. Well controlled on omeprazole 40 mg daily and Pepcid PRN.   Plan: Continue broad-spectrum antibiotic coverage for cholangitis. Continue IV fluids.  Continue supportive measures Monitor HFP, CBC daily She needs transfer to tertiary care center for ERCP/EUS.  Consider outpatient colonoscopy with primary GI team to follow-up on abnormal CT colon findings. Continue PPI daily.    LOS: 0 days    04/02/2021, 12:39 PM   Aliene Altes, [facility]

## 2021-04-03 DIAGNOSIS — J4540 Moderate persistent asthma, uncomplicated: Secondary | ICD-10-CM | POA: Diagnosis not present

## 2021-04-03 DIAGNOSIS — R1013 Epigastric pain: Secondary | ICD-10-CM | POA: Diagnosis not present

## 2021-04-03 DIAGNOSIS — B955 Unspecified streptococcus as the cause of diseases classified elsewhere: Secondary | ICD-10-CM | POA: Diagnosis not present

## 2021-04-03 DIAGNOSIS — K838 Other specified diseases of biliary tract: Secondary | ICD-10-CM | POA: Diagnosis not present

## 2021-04-03 DIAGNOSIS — Z87891 Personal history of nicotine dependence: Secondary | ICD-10-CM | POA: Diagnosis not present

## 2021-04-03 DIAGNOSIS — R748 Abnormal levels of other serum enzymes: Secondary | ICD-10-CM | POA: Diagnosis not present

## 2021-04-03 DIAGNOSIS — K8689 Other specified diseases of pancreas: Secondary | ICD-10-CM | POA: Diagnosis not present

## 2021-04-03 DIAGNOSIS — Z888 Allergy status to other drugs, medicaments and biological substances status: Secondary | ICD-10-CM | POA: Diagnosis not present

## 2021-04-03 DIAGNOSIS — Z980 Intestinal bypass and anastomosis status: Secondary | ICD-10-CM | POA: Diagnosis not present

## 2021-04-03 DIAGNOSIS — Z8719 Personal history of other diseases of the digestive system: Secondary | ICD-10-CM | POA: Diagnosis not present

## 2021-04-03 DIAGNOSIS — J449 Chronic obstructive pulmonary disease, unspecified: Secondary | ICD-10-CM | POA: Diagnosis not present

## 2021-04-03 DIAGNOSIS — Z881 Allergy status to other antibiotic agents status: Secondary | ICD-10-CM | POA: Diagnosis not present

## 2021-04-03 DIAGNOSIS — R933 Abnormal findings on diagnostic imaging of other parts of digestive tract: Secondary | ICD-10-CM | POA: Diagnosis not present

## 2021-04-03 DIAGNOSIS — Z90411 Acquired partial absence of pancreas: Secondary | ICD-10-CM | POA: Diagnosis not present

## 2021-04-03 DIAGNOSIS — R7989 Other specified abnormal findings of blood chemistry: Secondary | ICD-10-CM | POA: Diagnosis not present

## 2021-04-03 DIAGNOSIS — Z9049 Acquired absence of other specified parts of digestive tract: Secondary | ICD-10-CM | POA: Diagnosis not present

## 2021-04-03 DIAGNOSIS — Z794 Long term (current) use of insulin: Secondary | ICD-10-CM | POA: Diagnosis not present

## 2021-04-03 DIAGNOSIS — E891 Postprocedural hypoinsulinemia: Secondary | ICD-10-CM | POA: Diagnosis not present

## 2021-04-03 DIAGNOSIS — K8309 Other cholangitis: Secondary | ICD-10-CM | POA: Diagnosis not present

## 2021-04-03 DIAGNOSIS — K851 Biliary acute pancreatitis without necrosis or infection: Secondary | ICD-10-CM | POA: Diagnosis not present

## 2021-04-03 DIAGNOSIS — K219 Gastro-esophageal reflux disease without esophagitis: Secondary | ICD-10-CM | POA: Diagnosis not present

## 2021-04-03 DIAGNOSIS — Z8507 Personal history of malignant neoplasm of pancreas: Secondary | ICD-10-CM | POA: Diagnosis not present

## 2021-04-03 DIAGNOSIS — Z880 Allergy status to penicillin: Secondary | ICD-10-CM | POA: Diagnosis not present

## 2021-04-03 DIAGNOSIS — Z885 Allergy status to narcotic agent status: Secondary | ICD-10-CM | POA: Diagnosis not present

## 2021-04-03 DIAGNOSIS — K831 Obstruction of bile duct: Secondary | ICD-10-CM | POA: Diagnosis not present

## 2021-04-03 DIAGNOSIS — E1165 Type 2 diabetes mellitus with hyperglycemia: Secondary | ICD-10-CM | POA: Diagnosis not present

## 2021-04-03 DIAGNOSIS — I10 Essential (primary) hypertension: Secondary | ICD-10-CM | POA: Diagnosis not present

## 2021-04-03 DIAGNOSIS — K7689 Other specified diseases of liver: Secondary | ICD-10-CM | POA: Diagnosis not present

## 2021-04-03 DIAGNOSIS — Z79899 Other long term (current) drug therapy: Secondary | ICD-10-CM | POA: Diagnosis not present

## 2021-04-03 DIAGNOSIS — B9620 Unspecified Escherichia coli [E. coli] as the cause of diseases classified elsewhere: Secondary | ICD-10-CM | POA: Diagnosis not present

## 2021-04-03 DIAGNOSIS — K8681 Exocrine pancreatic insufficiency: Secondary | ICD-10-CM | POA: Diagnosis not present

## 2021-04-03 DIAGNOSIS — E139 Other specified diabetes mellitus without complications: Secondary | ICD-10-CM | POA: Diagnosis not present

## 2021-04-03 DIAGNOSIS — Z91014 Allergy to mammalian meats: Secondary | ICD-10-CM | POA: Diagnosis not present

## 2021-04-03 DIAGNOSIS — R7881 Bacteremia: Secondary | ICD-10-CM | POA: Diagnosis not present

## 2021-04-03 LAB — BLOOD CULTURE ID PANEL (REFLEXED) - BCID2
A.calcoaceticus-baumannii: NOT DETECTED
Bacteroides fragilis: NOT DETECTED
CTX-M ESBL: NOT DETECTED
Candida albicans: NOT DETECTED
Candida auris: NOT DETECTED
Candida glabrata: NOT DETECTED
Candida krusei: NOT DETECTED
Candida parapsilosis: NOT DETECTED
Candida tropicalis: NOT DETECTED
Carbapenem resist OXA 48 LIKE: NOT DETECTED
Carbapenem resistance IMP: NOT DETECTED
Carbapenem resistance KPC: NOT DETECTED
Carbapenem resistance NDM: NOT DETECTED
Carbapenem resistance VIM: NOT DETECTED
Cryptococcus neoformans/gattii: NOT DETECTED
Enterobacter cloacae complex: NOT DETECTED
Enterobacterales: DETECTED — AB
Enterococcus Faecium: NOT DETECTED
Enterococcus faecalis: NOT DETECTED
Escherichia coli: DETECTED — AB
Haemophilus influenzae: NOT DETECTED
Klebsiella aerogenes: NOT DETECTED
Klebsiella oxytoca: NOT DETECTED
Klebsiella pneumoniae: NOT DETECTED
Listeria monocytogenes: NOT DETECTED
Neisseria meningitidis: NOT DETECTED
Proteus species: NOT DETECTED
Pseudomonas aeruginosa: NOT DETECTED
Salmonella species: NOT DETECTED
Serratia marcescens: NOT DETECTED
Staphylococcus aureus (BCID): NOT DETECTED
Staphylococcus epidermidis: NOT DETECTED
Staphylococcus lugdunensis: NOT DETECTED
Staphylococcus species: NOT DETECTED
Stenotrophomonas maltophilia: NOT DETECTED
Streptococcus agalactiae: NOT DETECTED
Streptococcus pneumoniae: NOT DETECTED
Streptococcus pyogenes: NOT DETECTED
Streptococcus species: DETECTED — AB

## 2021-04-03 LAB — MRSA NEXT GEN BY PCR, NASAL: MRSA by PCR Next Gen: DETECTED — AB

## 2021-04-03 NOTE — Progress Notes (Signed)
Patient transferred to [facility] via [facility] in stable condition. report called to floor RN, Patient's family notified of patients transport.

## 2021-04-03 NOTE — Discharge Summary (Signed)
Brief Discharge Summary  Patient not seen by me prior to discharge. Arrangements made for patient transfer to [facility] secondary to need for specialized ERCP.   GI attending recommendations:  I discussed the case with Dr. Rush Landmark to determine the best way to approach the CBD and pancreatic strictures as she has an abnormal anatomy that would require specific approach for access to the biliary tree.  Based on our discussion, she will not be a candidate for an ERCP at [facility].  Due to this, we reached the [facility] transfer center and started transfer process for evaluation by the hepatobiliary endoscopy team at [facility].  We are waiting to have the final word regarding the acceptance for transfer.  For now she is to continue on the antibiotic regimen and with IV fluids, will recheck CMP to determine if the LFTs are increasing.  If she presents recurrent sepsis or worsening leukocytosis, may need to proceed with PTC if it takes too long for transfer for endoscopic retrograde cholangiopancreatography and definitive drainage.  Patient and the family understood and agreed.  H&P assessment and plan:  Assessment/Plan Principal Problem:   Acute pancreatitis     Fever and epigastric pain suggestive of acute pancreatitis with potential choledocholithiasis -MRCP ordered and pending -Continue aggressive IV fluid after 1 more liter bolus -Keep n.p.o. -Pain management with Dilaudid -Zofran ordered as needed for nausea and vomiting -Start antibiotic coverage with ertapenem due to penicillin allergy -Monitor transaminitis -IV PPI -GI consultation appreciated   Insulin-dependent type 2 diabetes -Currently with hyperglycemia -Continue on SSI -Avoid long-acting insulin while n.p.o.   History of asthma -No acute bronchospasms currently -Breathing treatments as needed   Prior history of Whipple procedure in 2016 due to mucinous cystic neoplasm of pancreas -Noted to have multiple cyst on  pancreas currently  Cordelia Poche, MD [facility] 04/04/2021, 5:27 PM

## 2021-04-06 LAB — CULTURE, BLOOD (ROUTINE X 2)
Special Requests: ADEQUATE
Special Requests: ADEQUATE

## 2021-04-09 ENCOUNTER — Encounter (HOSPITAL_COMMUNITY): Payer: Self-pay | Admitting: *Deleted

## 2021-04-09 ENCOUNTER — Other Ambulatory Visit: Payer: Self-pay

## 2021-04-09 ENCOUNTER — Emergency Department (HOSPITAL_COMMUNITY)
Admission: EM | Admit: 2021-04-09 | Discharge: 2021-04-09 | Disposition: A | Discharge status: Home / Self Care | Payer: HMO | Attending: Emergency Medicine | Admitting: Emergency Medicine

## 2021-04-09 DIAGNOSIS — Z5189 Encounter for other specified aftercare: Secondary | ICD-10-CM

## 2021-04-09 DIAGNOSIS — E119 Type 2 diabetes mellitus without complications: Secondary | ICD-10-CM | POA: Insufficient documentation

## 2021-04-09 DIAGNOSIS — J449 Chronic obstructive pulmonary disease, unspecified: Secondary | ICD-10-CM | POA: Diagnosis not present

## 2021-04-09 DIAGNOSIS — Z853 Personal history of malignant neoplasm of breast: Secondary | ICD-10-CM | POA: Diagnosis not present

## 2021-04-09 DIAGNOSIS — Y828 Other medical devices associated with adverse incidents: Secondary | ICD-10-CM | POA: Insufficient documentation

## 2021-04-09 DIAGNOSIS — T8131XA Disruption of external operation (surgical) wound, not elsewhere classified, initial encounter: Secondary | ICD-10-CM | POA: Diagnosis not present

## 2021-04-09 DIAGNOSIS — S21319D Laceration without foreign body of unspecified front wall of thorax with penetration into thoracic cavity, subsequent encounter: Secondary | ICD-10-CM | POA: Diagnosis not present

## 2021-04-09 DIAGNOSIS — Z4802 Encounter for removal of sutures: Secondary | ICD-10-CM | POA: Diagnosis not present

## 2021-04-09 DIAGNOSIS — J4530 Mild persistent asthma, uncomplicated: Secondary | ICD-10-CM | POA: Diagnosis not present

## 2021-04-09 LAB — CBG MONITORING, ED: Glucose-Capillary: 203 mg/dL — ABNORMAL HIGH (ref 70–99)

## 2021-04-09 MED ORDER — LIDOCAINE-EPINEPHRINE (PF) 1 %-1:200000 IJ SOLN
10.0000 mL | Freq: Once | INTRAMUSCULAR | Status: AC
  Administered 2021-04-09: 10 mL
  Filled 2021-04-09: qty 30

## 2021-04-09 NOTE — ED Triage Notes (Signed)
Pt c/o bleeding around post op drainage site this morning when she got it caught on the sink. Bleeding controlled.

## 2021-04-09 NOTE — ED Provider Notes (Signed)
[name] EMERGENCY DEPARTMENT Provider Note   CSN: [id number] Arrival date & time: 04/09/21  8527     History Chief Complaint  Patient presents with   Post-op Problem    Betty Nguyen is a 80 y.o. female.  HPI  80 year old female with recent diagnosis of cholangitis treated by GI at [facility] with percutaneous biliary drain presents to the emergency department after ripping one of the stitches out was holding her drain.  She states that she was standing at the sink today and when she turned abruptly the drain got caught and ripped one of the side stitches.  The second stitch remains intact and the tube was in place.  Does not appear that the tube was pulled out in any way.  There is been mild bleeding from where the stitch was pulled out but no other kind of drainage.  Patient is otherwise been doing well postoperatively.  Her fevers resolved, abdominal pain has resolved, she been able to eat and drink without difficulty.  She has follow-up in 2 weeks with gastroenterology.  Denies any other acute complaints.  Past Medical History:  Diagnosis Date   Abnormal levels of other serum enzymes    Anemia, unspecified    Anxiety    Anxiety and depression    Anxiety disorder, unspecified    Asthma    Breast carcinoma ([place]) 08/2009   Invasive ductal; left; lumpectomy and sentinel node excision- oncology visits yearly.   Chest pain    Chronic obstructive pulmonary disease with (acute) exacerbation (HCC)    Depression    Diabetes mellitus type II, controlled ([place])    diet controlled,exercise, no meds   Diverticulitis 2012   Treated medically   Exocrine pancreatic insufficiency    Gastroesophageal reflux    Hyperlipidemia    Hyperlipidemia, unspecified    Incisional hernia without obstruction or gangrene    Lumbago with sciatica, unspecified side    Major depressive disorder, single episode, unspecified    Malignant neoplasm of unspecified site of unspecified female breast ([place])     Menopause    Mild persistent asthma with (acute) exacerbation    Mild persistent asthma, uncomplicated    Nephrolithiasis    Other acariasis    Other allergy, subsequent encounter    Other specified diseases of pancreas    Tick bite    [place] Tick, alpha gal positive, makes her allergic to meat   Type 2 diabetes mellitus without complications ([place])    Uterine prolapse     Patient Active Problem List   Diagnosis Date Noted   Acute pancreatitis 04/02/2021   Abnormal LFTs    Biliary stricture    Pancreatic duct stricture    Type 2 diabetes mellitus with hyperglycemia, with long-term current use of insulin ([place]) 09/06/2019   Sinusitis 05/19/2019   Herpes 04/28/2019   Menopause 04/27/2019   Malignant neoplasm of left female breast ([place]) 04/27/2019   Chronic obstructive pulmonary disease with (acute) exacerbation (HCC)    Hyperlipidemia, unspecified    Major depressive disorder, single episode, unspecified    Abnormal levels of other serum enzymes    Anemia, unspecified    Lumbago with sciatica, unspecified side    Type 2 diabetes mellitus without complications ([place])    Cholangitis 05/03/2018   Elevated LFTs    Acute cholangitis 05/02/2018   Depression 05/02/2018   Hyperlipidemia 05/02/2018   Hyponatremia 05/02/2018   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[place]) 01/15/2017   Exocrine pancreatic insufficiency 01/15/2017  Muscle weakness 11/01/2012   Calf pain 10/02/2012   Asthma 07/12/2012   Gastroesophageal reflux    Nephrolithiasis    Diverticulitis    Chest pain    Breast carcinoma ([place]) 08/26/2009    Past Surgical History:  Procedure Laterality Date   BREAST LUMPECTOMY     Left   CESAREAN SECTION     CHOLECYSTECTOMY     COLONOSCOPY  2004   DILATION AND CURETTAGE OF UTERUS     EUS N/A 09/07/2014   Procedure: UPPER ENDOSCOPIC ULTRASOUND (EUS) LINEAR;  Surgeon: Milus Banister, MD;  Location: [facility] ENDOSCOPY;  Service: Endoscopy;  Laterality: N/A;    HERNIA REPAIR     NEPHROLITHOTOMY     TUBAL LIGATION     WHIPPLE PROCEDURE       OB History     Gravida  4   Para  2   Term  2   Preterm      AB      Living  2      SAB      IAB      Ectopic      Multiple      Live Births              Family History  Problem Relation Age of Onset   Coronary artery disease Mother    Stroke Father    Diabetes Mellitus II Father    Colon cancer Neg Hx     Social History   Tobacco Use   Smoking status: Never   Smokeless tobacco: Never  Vaping Use   Vaping Use: Never used  Substance Use Topics   Alcohol use: No   Drug use: No    Home Medications Prior to Admission medications   Medication Sig Start Date End Date Taking? Authorizing Provider  albuterol (PROVENTIL HFA;VENTOLIN HFA) 108 (90 BASE) MCG/ACT inhaler Inhale 2 puffs into the lungs every 6 (six) hours as needed for wheezing.    [provider]  dicyclomine (BENTYL) 10 MG capsule Take 40 mg by mouth 2 (two) times daily as needed. 05/08/20   [provider]  fluticasone furoate-vilanterol (BREO ELLIPTA) 200-25 MCG/INH AEPB Inhale 1 puff into the lungs daily.    [provider]  guaiFENesin (MUCINEX) 600 MG 12 hr tablet Take 1,200 mg by mouth 2 (two) times daily.    [provider]  insulin glargine, 2 Unit Dial, (TOUJEO MAX SOLOSTAR) 300 UNIT/ML Solostar Pen Inject 8 Units into the skin daily. 09/06/19   Shamleffer, Melanie Crazier, MD  insulin lispro (HUMALOG KWIKPEN) 100 UNIT/ML KwikPen MAX DAILY DOSE 30 UNITS. 02/08/21   Shamleffer, Melanie Crazier, MD  Multiple Vitamin (ONE-DAILY MULTI-VITAMIN PO) Take by mouth. Patient not taking: Reported on 04/02/2021    [provider]  omeprazole (PRILOSEC) 40 MG capsule Take 1 capsule (40 mg total) by mouth daily. Take 1 capsule shortly before breakfast meal each day. 07/16/20   Milus Banister, MD  Pancrelipase, Lip-Prot-Amyl, (CREON) 24000-76000 units CPEP TAKE 1 CAPSULE WITH  MEALS AND SNACKS UP TO 6 A DAY Patient taking differently: Take 1 capsule by mouth 3 (three) times daily with meals. TAKE 1 CAPSULE WITH MEALS AND SNACKS UP TO 6 A DAY 09/23/17   Cassandria Anger, MD  valACYclovir (VALTREX) 500 MG tablet Take 500 mg by mouth daily as needed (outbreak).     [provider]    Allergies    Amoxicillin, Biaxin [clarithromycin], Carafate [sucralfate], Codeine, Doxycycline, Meat [alpha-gal],  Metronidazole, and Cephalosporins  Review of Systems   Review of Systems  Constitutional:  Negative for chills and fever.  Respiratory:  Negative for shortness of breath.   Cardiovascular:  Negative for chest pain.  Gastrointestinal:  Negative for abdominal pain, diarrhea, nausea and vomiting.  Genitourinary:  Negative for dysuria.  Musculoskeletal:  Negative for back pain.  Skin:  Negative for rash.  Neurological:  Negative for headaches.   Physical Exam Updated Vital Signs BP 135/62   Pulse 88   Temp 98.2 F (36.8 C) (Oral)   Resp 18   Ht 5\' 2"  (1.575 m)   Wt 68 kg   SpO2 96%   BMI 27.44 kg/m   Physical Exam Vitals and nursing note reviewed.  Constitutional:      Appearance: Normal appearance.  HENT:     Head: Normocephalic.     Mouth/Throat:     Mouth: Mucous membranes are moist.  Cardiovascular:     Rate and Rhythm: Normal rate.  Pulmonary:     Effort: Pulmonary effort is normal. No respiratory distress.  Abdominal:     Palpations: Abdomen is soft.     Tenderness: There is no abdominal tenderness.     Comments: PBD in place, 1 suture in place holding the drain tightly, area where the previous suture was is slightly dehisced with small amount of bright red bleeding.  The tube appears to be in at the appropriate depth, no other abnormal drainage or signs of infection.  Skin:    General: Skin is warm.  Neurological:     Mental Status: She is alert and oriented to person, place, and time. Mental status is at baseline.  Psychiatric:         Mood and Affect: Mood normal.    ED Results / Procedures / Treatments   Labs (all labs ordered are listed, but only abnormal results are displayed) Labs Reviewed - No data to display  EKG None  Radiology No results found.  Procedures .Marland KitchenLaceration Repair  Date/Time: 04/09/2021 8:33 AM Performed by: Lorelle Gibbs, DO Authorized by: Lorelle Gibbs, DO   Consent:    Consent obtained:  Verbal   Consent given by:  Patient   Risks discussed:  Poor cosmetic result, poor wound healing and need for additional repair Universal protocol:    Patient identity confirmed:  Verbally with patient and arm band Anesthesia:    Anesthesia method:  Local infiltration   Local anesthetic:  Lidocaine 1% w/o epi Laceration details:    Location:  Trunk   Trunk location:  RUQ abd Pre-procedure details:    Preparation:  Patient was prepped and draped in usual sterile fashion Exploration:    Hemostasis achieved with:  Direct pressure Treatment:    Amount of cleaning:  Standard   Irrigation solution:  Sterile saline   Irrigation method:  Syringe   Debridement:  None   Undermining:  None Skin repair:    Repair method:  Sutures   Suture size:  4-0   Suture material:  Nylon   Suture technique:  Simple interrupted Approximation:    Approximation:  Close Repair type:    Repair type:  Simple Comments:     Placed on suture to close dehisced wound and re secure PBD   Medications Ordered in ED Medications  lidocaine-EPINEPHrine (XYLOCAINE-EPINEPHrine) 1 %-1:200000 (PF) injection 10 mL (10 mLs Infiltration Given by Other 04/09/21 0102)    ED Course  I have reviewed the triage vital signs and the nursing  notes.  Pertinent labs & imaging results that were available during my care of the patient were reviewed by me and considered in my medical decision making (see chart for details).    MDM Rules/Calculators/A&P                           80 year old female with past medical  history of recent PBD by GI at [place] presents to the emergency department after reviewing one of her stitches out.  The drain got caught on the sink and she ripped the side stitch out.  The second stitch remains with good tension on the tube.  There does not appear to be any change in the distance of the tube, small amount of bleeding but no other signs of infection.  Plan to replace the stitch on that side to resecure the drain and have her follow-up outpatient at her scheduled GI follow-up.  Patient at this time appears safe and stable for discharge and will be treated as an outpatient.  Discharge plan and strict return to ED precautions discussed, patient verbalizes understanding and agreement.  Final Clinical Impression(s) / ED Diagnoses Final diagnoses:  None    Rx / DC Orders ED Discharge Orders     None        Lorelle Gibbs, DO 04/09/21 1062

## 2021-04-09 NOTE — Discharge Instructions (Signed)
You have been seen and discharged from the emergency department.  Keep the wound dressed as directed.  Continue your flushes as directed by GI.  You may use clear plastic Tegaderm for protection to shower but do avoid getting any soap on the area or direct water.  Pat to dry.  Follow-up with your specialists for reevaluation and further care. Take home medications as prescribed. If you have any worsening symptoms or further concerns for your health please return to an emergency department for further evaluation.

## 2021-04-10 DIAGNOSIS — K743 Primary biliary cirrhosis: Secondary | ICD-10-CM | POA: Diagnosis not present

## 2021-04-10 DIAGNOSIS — B9620 Unspecified Escherichia coli [E. coli] as the cause of diseases classified elsewhere: Secondary | ICD-10-CM | POA: Diagnosis not present

## 2021-04-10 DIAGNOSIS — E109 Type 1 diabetes mellitus without complications: Secondary | ICD-10-CM | POA: Diagnosis not present

## 2021-04-10 DIAGNOSIS — R945 Abnormal results of liver function studies: Secondary | ICD-10-CM | POA: Diagnosis not present

## 2021-04-10 DIAGNOSIS — Z758 Other problems related to medical facilities and other health care: Secondary | ICD-10-CM | POA: Diagnosis not present

## 2021-04-10 DIAGNOSIS — B3731 Acute candidiasis of vulva and vagina: Secondary | ICD-10-CM | POA: Diagnosis not present

## 2021-04-12 ENCOUNTER — Ambulatory Visit: Payer: HMO | Admitting: Internal Medicine

## 2021-04-14 DIAGNOSIS — Z91014 Allergy to mammalian meats: Secondary | ICD-10-CM | POA: Diagnosis not present

## 2021-04-14 DIAGNOSIS — Z4659 Encounter for fitting and adjustment of other gastrointestinal appliance and device: Secondary | ICD-10-CM | POA: Diagnosis not present

## 2021-04-14 DIAGNOSIS — R6520 Severe sepsis without septic shock: Secondary | ICD-10-CM | POA: Diagnosis not present

## 2021-04-14 DIAGNOSIS — R7989 Other specified abnormal findings of blood chemistry: Secondary | ICD-10-CM | POA: Diagnosis not present

## 2021-04-14 DIAGNOSIS — Z853 Personal history of malignant neoplasm of breast: Secondary | ICD-10-CM | POA: Diagnosis not present

## 2021-04-14 DIAGNOSIS — Z87442 Personal history of urinary calculi: Secondary | ICD-10-CM | POA: Diagnosis not present

## 2021-04-14 DIAGNOSIS — K219 Gastro-esophageal reflux disease without esophagitis: Secondary | ICD-10-CM | POA: Diagnosis not present

## 2021-04-14 DIAGNOSIS — G8929 Other chronic pain: Secondary | ICD-10-CM | POA: Diagnosis not present

## 2021-04-14 DIAGNOSIS — Z881 Allergy status to other antibiotic agents status: Secondary | ICD-10-CM | POA: Diagnosis not present

## 2021-04-14 DIAGNOSIS — E089 Diabetes mellitus due to underlying condition without complications: Secondary | ICD-10-CM | POA: Diagnosis not present

## 2021-04-14 DIAGNOSIS — D649 Anemia, unspecified: Secondary | ICD-10-CM | POA: Diagnosis not present

## 2021-04-14 DIAGNOSIS — B9620 Unspecified Escherichia coli [E. coli] as the cause of diseases classified elsewhere: Secondary | ICD-10-CM | POA: Diagnosis not present

## 2021-04-14 DIAGNOSIS — K644 Residual hemorrhoidal skin tags: Secondary | ICD-10-CM | POA: Diagnosis not present

## 2021-04-14 DIAGNOSIS — R1011 Right upper quadrant pain: Secondary | ICD-10-CM | POA: Diagnosis not present

## 2021-04-14 DIAGNOSIS — E785 Hyperlipidemia, unspecified: Secondary | ICD-10-CM | POA: Diagnosis not present

## 2021-04-14 DIAGNOSIS — Z9049 Acquired absence of other specified parts of digestive tract: Secondary | ICD-10-CM | POA: Diagnosis not present

## 2021-04-14 DIAGNOSIS — Z20822 Contact with and (suspected) exposure to covid-19: Secondary | ICD-10-CM | POA: Diagnosis not present

## 2021-04-14 DIAGNOSIS — K649 Unspecified hemorrhoids: Secondary | ICD-10-CM | POA: Diagnosis not present

## 2021-04-14 DIAGNOSIS — E891 Postprocedural hypoinsulinemia: Secondary | ICD-10-CM | POA: Diagnosis not present

## 2021-04-14 DIAGNOSIS — E46 Unspecified protein-calorie malnutrition: Secondary | ICD-10-CM | POA: Diagnosis not present

## 2021-04-14 DIAGNOSIS — K8309 Other cholangitis: Secondary | ICD-10-CM | POA: Diagnosis not present

## 2021-04-14 DIAGNOSIS — T85520A Displacement of bile duct prosthesis, initial encounter: Secondary | ICD-10-CM | POA: Diagnosis not present

## 2021-04-14 DIAGNOSIS — E1169 Type 2 diabetes mellitus with other specified complication: Secondary | ICD-10-CM | POA: Diagnosis not present

## 2021-04-14 DIAGNOSIS — L304 Erythema intertrigo: Secondary | ICD-10-CM | POA: Diagnosis not present

## 2021-04-14 DIAGNOSIS — J449 Chronic obstructive pulmonary disease, unspecified: Secondary | ICD-10-CM | POA: Diagnosis not present

## 2021-04-14 DIAGNOSIS — Z91011 Allergy to milk products: Secondary | ICD-10-CM | POA: Diagnosis not present

## 2021-04-14 DIAGNOSIS — Y831 Surgical operation with implant of artificial internal device as the cause of abnormal reaction of the patient, or of later complication, without mention of misadventure at the time of the procedure: Secondary | ICD-10-CM | POA: Diagnosis not present

## 2021-04-14 DIAGNOSIS — K831 Obstruction of bile duct: Secondary | ICD-10-CM | POA: Diagnosis not present

## 2021-04-14 DIAGNOSIS — K869 Disease of pancreas, unspecified: Secondary | ICD-10-CM | POA: Diagnosis not present

## 2021-04-14 DIAGNOSIS — A419 Sepsis, unspecified organism: Secondary | ICD-10-CM | POA: Diagnosis not present

## 2021-04-14 DIAGNOSIS — K7200 Acute and subacute hepatic failure without coma: Secondary | ICD-10-CM | POA: Diagnosis not present

## 2021-04-14 DIAGNOSIS — Z794 Long term (current) use of insulin: Secondary | ICD-10-CM | POA: Diagnosis not present

## 2021-04-14 DIAGNOSIS — J4540 Moderate persistent asthma, uncomplicated: Secondary | ICD-10-CM | POA: Diagnosis not present

## 2021-04-23 DIAGNOSIS — Z758 Other problems related to medical facilities and other health care: Secondary | ICD-10-CM | POA: Diagnosis not present

## 2021-04-23 DIAGNOSIS — R945 Abnormal results of liver function studies: Secondary | ICD-10-CM | POA: Diagnosis not present

## 2021-04-23 DIAGNOSIS — M7989 Other specified soft tissue disorders: Secondary | ICD-10-CM | POA: Diagnosis not present

## 2021-04-23 DIAGNOSIS — K743 Primary biliary cirrhosis: Secondary | ICD-10-CM | POA: Diagnosis not present

## 2021-04-23 DIAGNOSIS — E109 Type 1 diabetes mellitus without complications: Secondary | ICD-10-CM | POA: Diagnosis not present

## 2021-04-26 DIAGNOSIS — I10 Essential (primary) hypertension: Secondary | ICD-10-CM | POA: Diagnosis not present

## 2021-04-26 DIAGNOSIS — E782 Mixed hyperlipidemia: Secondary | ICD-10-CM | POA: Diagnosis not present

## 2021-05-01 DIAGNOSIS — K831 Obstruction of bile duct: Secondary | ICD-10-CM | POA: Diagnosis not present

## 2021-05-01 DIAGNOSIS — Z4803 Encounter for change or removal of drains: Secondary | ICD-10-CM | POA: Diagnosis not present

## 2021-05-01 DIAGNOSIS — Z4659 Encounter for fitting and adjustment of other gastrointestinal appliance and device: Secondary | ICD-10-CM | POA: Diagnosis not present

## 2021-05-02 DIAGNOSIS — R059 Cough, unspecified: Secondary | ICD-10-CM | POA: Diagnosis not present

## 2021-05-03 DIAGNOSIS — K831 Obstruction of bile duct: Secondary | ICD-10-CM | POA: Diagnosis not present

## 2021-05-03 DIAGNOSIS — Z4659 Encounter for fitting and adjustment of other gastrointestinal appliance and device: Secondary | ICD-10-CM | POA: Diagnosis not present

## 2021-05-03 DIAGNOSIS — Z4803 Encounter for change or removal of drains: Secondary | ICD-10-CM | POA: Diagnosis not present

## 2021-05-23 DIAGNOSIS — K831 Obstruction of bile duct: Secondary | ICD-10-CM | POA: Diagnosis not present

## 2021-05-23 DIAGNOSIS — Z4803 Encounter for change or removal of drains: Secondary | ICD-10-CM | POA: Diagnosis not present

## 2021-05-28 DIAGNOSIS — I10 Essential (primary) hypertension: Secondary | ICD-10-CM | POA: Diagnosis not present

## 2021-05-28 DIAGNOSIS — E782 Mixed hyperlipidemia: Secondary | ICD-10-CM | POA: Diagnosis not present

## 2021-06-03 DIAGNOSIS — E109 Type 1 diabetes mellitus without complications: Secondary | ICD-10-CM | POA: Diagnosis not present

## 2021-06-03 DIAGNOSIS — I10 Essential (primary) hypertension: Secondary | ICD-10-CM | POA: Diagnosis not present

## 2021-06-04 DIAGNOSIS — Z4803 Encounter for change or removal of drains: Secondary | ICD-10-CM | POA: Diagnosis not present

## 2021-06-04 DIAGNOSIS — K831 Obstruction of bile duct: Secondary | ICD-10-CM | POA: Diagnosis not present

## 2021-06-04 DIAGNOSIS — Z4659 Encounter for fitting and adjustment of other gastrointestinal appliance and device: Secondary | ICD-10-CM | POA: Diagnosis not present

## 2021-06-06 DIAGNOSIS — D649 Anemia, unspecified: Secondary | ICD-10-CM | POA: Diagnosis not present

## 2021-06-06 DIAGNOSIS — K743 Primary biliary cirrhosis: Secondary | ICD-10-CM | POA: Diagnosis not present

## 2021-06-06 DIAGNOSIS — E109 Type 1 diabetes mellitus without complications: Secondary | ICD-10-CM | POA: Diagnosis not present

## 2021-06-06 DIAGNOSIS — Z0001 Encounter for general adult medical examination with abnormal findings: Secondary | ICD-10-CM | POA: Diagnosis not present

## 2021-06-06 DIAGNOSIS — I10 Essential (primary) hypertension: Secondary | ICD-10-CM | POA: Diagnosis not present

## 2021-06-06 DIAGNOSIS — R945 Abnormal results of liver function studies: Secondary | ICD-10-CM | POA: Diagnosis not present

## 2021-07-01 DIAGNOSIS — K8689 Other specified diseases of pancreas: Secondary | ICD-10-CM | POA: Diagnosis not present

## 2021-07-01 DIAGNOSIS — K9189 Other postprocedural complications and disorders of digestive system: Secondary | ICD-10-CM | POA: Diagnosis not present

## 2021-07-01 DIAGNOSIS — R1084 Generalized abdominal pain: Secondary | ICD-10-CM | POA: Diagnosis not present

## 2021-07-26 DIAGNOSIS — I10 Essential (primary) hypertension: Secondary | ICD-10-CM | POA: Diagnosis not present

## 2021-07-26 DIAGNOSIS — E785 Hyperlipidemia, unspecified: Secondary | ICD-10-CM | POA: Diagnosis not present

## 2021-08-06 DIAGNOSIS — M170 Bilateral primary osteoarthritis of knee: Secondary | ICD-10-CM | POA: Diagnosis not present

## 2021-09-09 DIAGNOSIS — I10 Essential (primary) hypertension: Secondary | ICD-10-CM | POA: Diagnosis not present

## 2021-09-09 DIAGNOSIS — E109 Type 1 diabetes mellitus without complications: Secondary | ICD-10-CM | POA: Diagnosis not present

## 2021-09-12 DIAGNOSIS — D649 Anemia, unspecified: Secondary | ICD-10-CM | POA: Diagnosis not present

## 2021-09-12 DIAGNOSIS — R945 Abnormal results of liver function studies: Secondary | ICD-10-CM | POA: Diagnosis not present

## 2021-09-12 DIAGNOSIS — K743 Primary biliary cirrhosis: Secondary | ICD-10-CM | POA: Diagnosis not present

## 2021-09-12 DIAGNOSIS — I10 Essential (primary) hypertension: Secondary | ICD-10-CM | POA: Diagnosis not present

## 2021-09-12 DIAGNOSIS — E109 Type 1 diabetes mellitus without complications: Secondary | ICD-10-CM | POA: Diagnosis not present

## 2021-09-25 DIAGNOSIS — K219 Gastro-esophageal reflux disease without esophagitis: Secondary | ICD-10-CM | POA: Diagnosis not present

## 2021-09-25 DIAGNOSIS — E119 Type 2 diabetes mellitus without complications: Secondary | ICD-10-CM | POA: Diagnosis not present

## 2021-09-25 DIAGNOSIS — I10 Essential (primary) hypertension: Secondary | ICD-10-CM | POA: Diagnosis not present

## 2021-10-11 DIAGNOSIS — L989 Disorder of the skin and subcutaneous tissue, unspecified: Secondary | ICD-10-CM | POA: Diagnosis not present

## 2021-10-11 DIAGNOSIS — E119 Type 2 diabetes mellitus without complications: Secondary | ICD-10-CM | POA: Diagnosis not present

## 2021-10-11 DIAGNOSIS — F419 Anxiety disorder, unspecified: Secondary | ICD-10-CM | POA: Diagnosis not present

## 2021-10-16 DIAGNOSIS — K8689 Other specified diseases of pancreas: Secondary | ICD-10-CM | POA: Diagnosis not present

## 2021-10-16 DIAGNOSIS — K862 Cyst of pancreas: Secondary | ICD-10-CM | POA: Diagnosis not present

## 2021-10-23 DIAGNOSIS — K862 Cyst of pancreas: Secondary | ICD-10-CM | POA: Diagnosis not present

## 2021-10-23 DIAGNOSIS — K8689 Other specified diseases of pancreas: Secondary | ICD-10-CM | POA: Diagnosis not present

## 2021-10-23 DIAGNOSIS — D490 Neoplasm of unspecified behavior of digestive system: Secondary | ICD-10-CM | POA: Diagnosis not present

## 2021-10-25 DIAGNOSIS — K219 Gastro-esophageal reflux disease without esophagitis: Secondary | ICD-10-CM | POA: Diagnosis not present

## 2021-10-25 DIAGNOSIS — E119 Type 2 diabetes mellitus without complications: Secondary | ICD-10-CM | POA: Diagnosis not present

## 2021-10-25 DIAGNOSIS — I10 Essential (primary) hypertension: Secondary | ICD-10-CM | POA: Diagnosis not present

## 2021-11-25 DIAGNOSIS — I10 Essential (primary) hypertension: Secondary | ICD-10-CM | POA: Diagnosis not present

## 2021-11-25 DIAGNOSIS — E119 Type 2 diabetes mellitus without complications: Secondary | ICD-10-CM | POA: Diagnosis not present

## 2021-11-25 DIAGNOSIS — K219 Gastro-esophageal reflux disease without esophagitis: Secondary | ICD-10-CM | POA: Diagnosis not present

## 2021-11-27 DIAGNOSIS — X32XXXD Exposure to sunlight, subsequent encounter: Secondary | ICD-10-CM | POA: Diagnosis not present

## 2021-11-27 DIAGNOSIS — C44311 Basal cell carcinoma of skin of nose: Secondary | ICD-10-CM | POA: Diagnosis not present

## 2021-11-27 DIAGNOSIS — L570 Actinic keratosis: Secondary | ICD-10-CM | POA: Diagnosis not present

## 2021-12-13 DIAGNOSIS — I10 Essential (primary) hypertension: Secondary | ICD-10-CM | POA: Diagnosis not present

## 2021-12-13 DIAGNOSIS — E109 Type 1 diabetes mellitus without complications: Secondary | ICD-10-CM | POA: Diagnosis not present

## 2021-12-18 DIAGNOSIS — F419 Anxiety disorder, unspecified: Secondary | ICD-10-CM | POA: Diagnosis not present

## 2021-12-18 DIAGNOSIS — R945 Abnormal results of liver function studies: Secondary | ICD-10-CM | POA: Diagnosis not present

## 2021-12-18 DIAGNOSIS — D649 Anemia, unspecified: Secondary | ICD-10-CM | POA: Diagnosis not present

## 2021-12-18 DIAGNOSIS — L989 Disorder of the skin and subcutaneous tissue, unspecified: Secondary | ICD-10-CM | POA: Diagnosis not present

## 2021-12-18 DIAGNOSIS — E119 Type 2 diabetes mellitus without complications: Secondary | ICD-10-CM | POA: Diagnosis not present

## 2021-12-18 DIAGNOSIS — K743 Primary biliary cirrhosis: Secondary | ICD-10-CM | POA: Diagnosis not present

## 2021-12-18 DIAGNOSIS — Z6828 Body mass index (BMI) 28.0-28.9, adult: Secondary | ICD-10-CM | POA: Diagnosis not present

## 2021-12-18 DIAGNOSIS — I10 Essential (primary) hypertension: Secondary | ICD-10-CM | POA: Diagnosis not present

## 2021-12-18 DIAGNOSIS — E109 Type 1 diabetes mellitus without complications: Secondary | ICD-10-CM | POA: Diagnosis not present

## 2021-12-26 DIAGNOSIS — I10 Essential (primary) hypertension: Secondary | ICD-10-CM | POA: Diagnosis not present

## 2021-12-26 DIAGNOSIS — K219 Gastro-esophageal reflux disease without esophagitis: Secondary | ICD-10-CM | POA: Diagnosis not present

## 2021-12-26 DIAGNOSIS — E119 Type 2 diabetes mellitus without complications: Secondary | ICD-10-CM | POA: Diagnosis not present

## 2022-01-08 DIAGNOSIS — Z85828 Personal history of other malignant neoplasm of skin: Secondary | ICD-10-CM | POA: Diagnosis not present

## 2022-01-08 DIAGNOSIS — D225 Melanocytic nevi of trunk: Secondary | ICD-10-CM | POA: Diagnosis not present

## 2022-01-08 DIAGNOSIS — Z08 Encounter for follow-up examination after completed treatment for malignant neoplasm: Secondary | ICD-10-CM | POA: Diagnosis not present

## 2022-01-08 DIAGNOSIS — Z1283 Encounter for screening for malignant neoplasm of skin: Secondary | ICD-10-CM | POA: Diagnosis not present

## 2022-01-25 DIAGNOSIS — K219 Gastro-esophageal reflux disease without esophagitis: Secondary | ICD-10-CM | POA: Diagnosis not present

## 2022-01-25 DIAGNOSIS — I10 Essential (primary) hypertension: Secondary | ICD-10-CM | POA: Diagnosis not present

## 2022-01-25 DIAGNOSIS — E119 Type 2 diabetes mellitus without complications: Secondary | ICD-10-CM | POA: Diagnosis not present

## 2022-01-30 DIAGNOSIS — J45909 Unspecified asthma, uncomplicated: Secondary | ICD-10-CM | POA: Diagnosis not present

## 2022-01-30 DIAGNOSIS — J309 Allergic rhinitis, unspecified: Secondary | ICD-10-CM | POA: Diagnosis not present

## 2022-05-23 DIAGNOSIS — M170 Bilateral primary osteoarthritis of knee: Secondary | ICD-10-CM | POA: Diagnosis not present

## 2022-06-23 DIAGNOSIS — Z20822 Contact with and (suspected) exposure to covid-19: Secondary | ICD-10-CM | POA: Diagnosis not present

## 2022-07-09 DIAGNOSIS — Z1283 Encounter for screening for malignant neoplasm of skin: Secondary | ICD-10-CM | POA: Diagnosis not present

## 2022-07-09 DIAGNOSIS — D225 Melanocytic nevi of trunk: Secondary | ICD-10-CM | POA: Diagnosis not present

## 2022-07-09 DIAGNOSIS — Z08 Encounter for follow-up examination after completed treatment for malignant neoplasm: Secondary | ICD-10-CM | POA: Diagnosis not present

## 2022-07-09 DIAGNOSIS — Z85828 Personal history of other malignant neoplasm of skin: Secondary | ICD-10-CM | POA: Diagnosis not present

## 2022-07-09 DIAGNOSIS — E109 Type 1 diabetes mellitus without complications: Secondary | ICD-10-CM | POA: Diagnosis not present

## 2022-07-09 DIAGNOSIS — I10 Essential (primary) hypertension: Secondary | ICD-10-CM | POA: Diagnosis not present

## 2022-07-15 DIAGNOSIS — J4520 Mild intermittent asthma, uncomplicated: Secondary | ICD-10-CM | POA: Diagnosis not present

## 2022-07-15 DIAGNOSIS — H900 Conductive hearing loss, bilateral: Secondary | ICD-10-CM | POA: Diagnosis not present

## 2022-07-15 DIAGNOSIS — Z0000 Encounter for general adult medical examination without abnormal findings: Secondary | ICD-10-CM | POA: Diagnosis not present

## 2022-07-15 DIAGNOSIS — F419 Anxiety disorder, unspecified: Secondary | ICD-10-CM | POA: Diagnosis not present

## 2022-07-15 DIAGNOSIS — K219 Gastro-esophageal reflux disease without esophagitis: Secondary | ICD-10-CM | POA: Diagnosis not present

## 2022-07-15 DIAGNOSIS — M7089 Other soft tissue disorders related to use, overuse and pressure multiple sites: Secondary | ICD-10-CM | POA: Diagnosis not present

## 2022-07-15 DIAGNOSIS — R7401 Elevation of levels of liver transaminase levels: Secondary | ICD-10-CM | POA: Diagnosis not present

## 2022-07-15 DIAGNOSIS — I10 Essential (primary) hypertension: Secondary | ICD-10-CM | POA: Diagnosis not present

## 2022-07-15 DIAGNOSIS — D649 Anemia, unspecified: Secondary | ICD-10-CM | POA: Diagnosis not present

## 2022-07-15 DIAGNOSIS — K743 Primary biliary cirrhosis: Secondary | ICD-10-CM | POA: Diagnosis not present

## 2022-07-15 DIAGNOSIS — E109 Type 1 diabetes mellitus without complications: Secondary | ICD-10-CM | POA: Diagnosis not present

## 2022-07-15 DIAGNOSIS — H9190 Unspecified hearing loss, unspecified ear: Secondary | ICD-10-CM | POA: Diagnosis not present

## 2022-10-11 ENCOUNTER — Ambulatory Visit
Admission: EM | Admit: 2022-10-11 | Discharge: 2022-10-11 | Disposition: A | Discharge status: Home / Self Care | Payer: HMO | Attending: Family Medicine | Admitting: Family Medicine

## 2022-10-11 DIAGNOSIS — R030 Elevated blood-pressure reading, without diagnosis of hypertension: Secondary | ICD-10-CM | POA: Diagnosis not present

## 2022-10-11 DIAGNOSIS — E1065 Type 1 diabetes mellitus with hyperglycemia: Secondary | ICD-10-CM | POA: Diagnosis not present

## 2022-10-11 DIAGNOSIS — H8110 Benign paroxysmal vertigo, unspecified ear: Secondary | ICD-10-CM

## 2022-10-11 LAB — POCT URINALYSIS DIP (MANUAL ENTRY)
Bilirubin, UA: NEGATIVE
Glucose, UA: NEGATIVE mg/dL
Ketones, POC UA: NEGATIVE mg/dL
Leukocytes, UA: NEGATIVE
Nitrite, UA: NEGATIVE
Protein Ur, POC: NEGATIVE mg/dL
Spec Grav, UA: 1.02 (ref 1.010–1.025)
Urobilinogen, UA: 0.2 E.U./dL
pH, UA: 5.5 (ref 5.0–8.0)

## 2022-10-11 LAB — POCT FASTING CBG KUC MANUAL ENTRY: POCT Glucose (KUC): 111 mg/dL — AB (ref 70–99)

## 2022-10-11 NOTE — ED Triage Notes (Signed)
Pt reports she has been feeling weak, dizziness, and and having elevated  blood pressure readings x 1 week.

## 2022-10-11 NOTE — Discharge Instructions (Addendum)
Your blood pressure was noted to be elevated during your visit today. If you are currently taking medication for high blood pressure, please ensure you are taking this as directed. If you do not have a history of high blood pressure and your blood pressure remains persistently elevated, you may need to begin taking a medication at some point. You may return here within the next few days to recheck if unable to see your primary care provider or if you do not have a one.  BP (!) 148/68 (BP Location: Right Arm)   Pulse 80   Temp 98.3 F (36.8 C) (Oral)   Resp 18   SpO2 96%   BP Readings from Last 3 Encounters:  10/11/22 (!) 148/68  04/09/21 135/62  04/03/21 (!) 111/53

## 2022-10-15 NOTE — ED Provider Notes (Signed)
[facility] CARE [facility]ENTER   [id number] 10/11/22 Arrival Time: 1016  ASSESSMENT & PLAN:  1. Benign paroxysmal positional vertigo, unspecified laterality   2. Elevated blood pressure reading without diagnosis of hypertension   3. Type 1 diabetes mellitus with hyperglycemia, with long-term current use of insulin (HCC)    Normal neurologic exam.  Reassured that these symptoms do not appear to represent a serious or threatening condition. This is generally a self-limited temporary but uncomfortable situation. Rest, avoid potentially dangerous activities. Will proceed to the ED if she develops other symptoms such as alterations of speech, swallowing, vision, motor/sensory systems, or if dizziness worsens.  Results for orders placed or performed during the hospital encounter of 10/11/22  POCT urinalysis dipstick  Result Value Ref Range   Color, UA yellow yellow   Clarity, UA clear clear   Glucose, UA negative negative mg/dL   Bilirubin, UA negative negative   Ketones, POC UA negative negative mg/dL   Spec Grav, UA 5.284 1.324 - 1.025   Blood, UA trace-lysed (A) negative   pH, UA 5.5 5.0 - 8.0   Protein Ur, POC negative negative mg/dL   Urobilinogen, UA 0.2 0.2 or 1.0 E.U./dL   Nitrite, UA Negative Negative   Leukocytes, UA Negative Negative  POCT CBG (manual entry)  Result Value Ref Range   POCT Glucose ([facility]) 111 (A) 70 - 99 mg/dL    Follow-up Information     Schedule an appointment as soon as possible for a visit  with Benita Stabile, MD.   Specialty: Internal Medicine Contact information: [address] [place] [address] [phone]               To recheck BP.  Reviewed expectations re: course of current medical issues. Questions answered. Outlined signs and symptoms indicating need for more acute intervention. Patient verbalized understanding. After Visit Summary given.   SUBJECTIVE:  Betty Nguyen is a 82 y.o. female who reports. Pt reports she has been  feeling dizziness along with noting elevated blood pressure readings x 1 week. Does not have specific measurements. Dizziness describes as vertigo; worse with head movements. Denies gait problems, headaches, memory problems, paresthesia, speech problems, and weakness as well as aural pressure, otalgia, and hearing loss. Recent infections: none. Head trauma: denied. Noise exposure:  none . No associated SOB, CP, or palpatations reported. Recent travel: none. Reports normal bowel/bladder habits. Normal ambulation.  Social History   Substance and Sexual Activity  Alcohol Use No   Social History   Tobacco Use  Smoking Status Never  Smokeless Tobacco Never   Denies recreational drug use.  Increased blood pressure noted today. Reports that she has not been treated for hypertension in the past.  Feels blood sugars have been controlled lately.  OBJECTIVE:  Vitals:   10/11/22 1029  BP: (!) 148/68  Pulse: 80  Resp: 18  Temp: 98.3 F (36.8 C)  TempSrc: Oral  SpO2: 96%    General appearance: alert; no distress Eyes: PERRLA; EOMI; conjunctiva normal HENT: normocephalic; atraumatic; TMs normal; nasal mucosa normal; oral mucosa normal Neck: supple with FROM Lungs: clear to auscultation bilaterally Heart: regular rate and rhythm Abdomen: soft, non-tender; bowel sounds normal Extremities: no cyanosis or edema; symmetrical with no gross deformities Skin: warm and dry Neurologic: normal gait; DTR's normal and symmetric; CN 2-12 grossly intact; rapid changes in position during the exam do precipitate brief vertigo Psychological: alert and cooperative; normal mood and affect  Investigations: Results for orders placed or  performed during the hospital encounter of 10/11/22  POCT urinalysis dipstick  Result Value Ref Range   Color, UA yellow yellow   Clarity, UA clear clear   Glucose, UA negative negative mg/dL   Bilirubin, UA negative negative   Ketones, POC UA negative negative mg/dL    Spec Grav, UA 1.610 1.010 - 1.025   Blood, UA trace-lysed (A) negative   pH, UA 5.5 5.0 - 8.0   Protein Ur, POC negative negative mg/dL   Urobilinogen, UA 0.2 0.2 or 1.0 E.U./dL   Nitrite, UA Negative Negative   Leukocytes, UA Negative Negative  POCT CBG (manual entry)  Result Value Ref Range   POCT Glucose ([facility]) 111 (A) 70 - 99 mg/dL   Labs Reviewed  POCT URINALYSIS DIP (MANUAL ENTRY) - Abnormal; Notable for the following components:      Result Value   Blood, UA trace-lysed (*)    All other components within normal limits  POCT FASTING CBG [facility] MANUAL ENTRY - Abnormal; Notable for the following components:   POCT Glucose ([facility]) 111 (*)    All other components within normal limits   No results found.  Allergies  Allergen Reactions   Amoxicillin    Biaxin [Clarithromycin] Other (See Comments)    Hallucinations   Carafate [Sucralfate] Other (See Comments)    Flared up her vertigo, run her blood sugar up and caused constipation   Codeine Other (See Comments)    Hallucinations    Doxycycline Other (See Comments)    GI upset , turned stool orange   Meat [Alpha-Gal]     RED MEAT    Metronidazole Other (See Comments)    Heart Palpations -flagyl   Cephalosporins Hives and Palpitations    Past Medical History:  Diagnosis Date   Abnormal levels of other serum enzymes    Anemia, unspecified    Anxiety    Anxiety and depression    Anxiety disorder, unspecified    Asthma    Breast carcinoma (HCC) 08/2009   Invasive ductal; left; lumpectomy and sentinel node excision- oncology visits yearly.   Chest pain    Chronic obstructive pulmonary disease with (acute) exacerbation (HCC)    Depression    Diabetes mellitus type II, controlled (HCC)    diet controlled,exercise, no meds   Diverticulitis 2012   Treated medically   Exocrine pancreatic insufficiency    Gastroesophageal reflux    Hyperlipidemia    Hyperlipidemia, unspecified    Incisional hernia without obstruction or  gangrene    Lumbago with sciatica, unspecified side    Major depressive disorder, single episode, unspecified    Malignant neoplasm of unspecified site of unspecified female breast (HCC)    Menopause    Mild persistent asthma with (acute) exacerbation    Mild persistent asthma, uncomplicated    Nephrolithiasis    Other acariasis    Other allergy, subsequent encounter    Other specified diseases of pancreas    Tick bite    [place] Tick, alpha gal positive, makes her allergic to meat   Type 2 diabetes mellitus without complications (HCC)    Uterine prolapse    Social History   Socioeconomic History   Marital status: Married    Spouse name: Not on file   Number of children: 2   Years of education: Not on file   Highest education level: Not on file  Occupational History   Occupation: Horticulturist, [employer]  Tobacco Use   Smoking status: Never   Smokeless  tobacco: Never  Vaping Use   Vaping Use: Never used  Substance and Sexual Activity   Alcohol use: No   Drug use: No   Sexual activity: Not Currently  Other Topics Concern   Not on file  Social History Narrative   Not on file   Social Determinants of Health   Financial Resource Strain: Not on file  Food Insecurity: Not on file  Transportation Needs: Not on file  Physical Activity: Not on file  Stress: Not on file  Social Connections: Not on file  Intimate Partner Violence: Not on file   Family History  Problem Relation Age of Onset   Coronary artery disease Mother    Stroke Father    Diabetes Mellitus II Father    Colon cancer Neg Hx    Past Surgical History:  Procedure Laterality Date   BREAST LUMPECTOMY     Left   CESAREAN SECTION     CHOLECYSTECTOMY     COLONOSCOPY  2004   DILATION AND CURETTAGE OF UTERUS     EUS N/A 09/07/2014   Procedure: UPPER ENDOSCOPIC ULTRASOUND (EUS) LINEAR;  Surgeon: Rachael Fee, MD;  Location: [facility];  Service: Endoscopy;  Laterality: N/A;   HERNIA REPAIR      NEPHROLITHOTOMY     TUBAL LIGATION     WHIPPLE PROCEDURE         Mardella Layman, MD 10/15/22 1320

## 2022-10-22 DIAGNOSIS — K862 Cyst of pancreas: Secondary | ICD-10-CM | POA: Diagnosis not present

## 2022-10-22 DIAGNOSIS — K8689 Other specified diseases of pancreas: Secondary | ICD-10-CM | POA: Diagnosis not present

## 2022-10-22 DIAGNOSIS — Z90411 Acquired partial absence of pancreas: Secondary | ICD-10-CM | POA: Diagnosis not present

## 2022-10-22 DIAGNOSIS — D490 Neoplasm of unspecified behavior of digestive system: Secondary | ICD-10-CM | POA: Diagnosis not present

## 2022-11-07 DIAGNOSIS — E109 Type 1 diabetes mellitus without complications: Secondary | ICD-10-CM | POA: Diagnosis not present

## 2022-11-07 DIAGNOSIS — I10 Essential (primary) hypertension: Secondary | ICD-10-CM | POA: Diagnosis not present

## 2022-11-13 ENCOUNTER — Other Ambulatory Visit (HOSPITAL_COMMUNITY): Payer: Self-pay | Admitting: Family Medicine

## 2022-11-13 DIAGNOSIS — M79605 Pain in left leg: Secondary | ICD-10-CM | POA: Diagnosis not present

## 2022-11-13 DIAGNOSIS — I700 Atherosclerosis of aorta: Secondary | ICD-10-CM | POA: Diagnosis not present

## 2022-11-13 DIAGNOSIS — M79604 Pain in right leg: Secondary | ICD-10-CM

## 2022-11-13 DIAGNOSIS — J4520 Mild intermittent asthma, uncomplicated: Secondary | ICD-10-CM | POA: Diagnosis not present

## 2022-11-13 DIAGNOSIS — M7989 Other specified soft tissue disorders: Secondary | ICD-10-CM | POA: Diagnosis not present

## 2022-11-13 DIAGNOSIS — K743 Primary biliary cirrhosis: Secondary | ICD-10-CM | POA: Diagnosis not present

## 2022-11-13 DIAGNOSIS — K219 Gastro-esophageal reflux disease without esophagitis: Secondary | ICD-10-CM | POA: Diagnosis not present

## 2022-11-13 DIAGNOSIS — E109 Type 1 diabetes mellitus without complications: Secondary | ICD-10-CM | POA: Diagnosis not present

## 2022-11-13 DIAGNOSIS — D649 Anemia, unspecified: Secondary | ICD-10-CM | POA: Diagnosis not present

## 2022-11-13 DIAGNOSIS — F419 Anxiety disorder, unspecified: Secondary | ICD-10-CM | POA: Diagnosis not present

## 2022-11-13 DIAGNOSIS — R945 Abnormal results of liver function studies: Secondary | ICD-10-CM | POA: Diagnosis not present

## 2022-11-13 DIAGNOSIS — I10 Essential (primary) hypertension: Secondary | ICD-10-CM | POA: Diagnosis not present

## 2022-11-13 DIAGNOSIS — H900 Conductive hearing loss, bilateral: Secondary | ICD-10-CM | POA: Diagnosis not present

## 2022-11-24 ENCOUNTER — Ambulatory Visit (HOSPITAL_COMMUNITY)
Admission: RE | Admit: 2022-11-24 | Discharge: 2022-11-24 | Disposition: A | Discharge status: Home / Self Care | Payer: PPO | Source: Ambulatory Visit | Attending: Family Medicine | Admitting: Family Medicine

## 2022-11-24 DIAGNOSIS — M79604 Pain in right leg: Secondary | ICD-10-CM | POA: Diagnosis not present

## 2022-11-24 DIAGNOSIS — M79605 Pain in left leg: Secondary | ICD-10-CM | POA: Diagnosis not present

## 2023-01-01 DIAGNOSIS — E119 Type 2 diabetes mellitus without complications: Secondary | ICD-10-CM | POA: Diagnosis not present

## 2023-01-07 DIAGNOSIS — Z85828 Personal history of other malignant neoplasm of skin: Secondary | ICD-10-CM | POA: Diagnosis not present

## 2023-01-07 DIAGNOSIS — L821 Other seborrheic keratosis: Secondary | ICD-10-CM | POA: Diagnosis not present

## 2023-01-07 DIAGNOSIS — Z08 Encounter for follow-up examination after completed treatment for malignant neoplasm: Secondary | ICD-10-CM | POA: Diagnosis not present

## 2023-01-07 DIAGNOSIS — L708 Other acne: Secondary | ICD-10-CM | POA: Diagnosis not present

## 2023-03-10 ENCOUNTER — Other Ambulatory Visit: Payer: Self-pay

## 2023-03-10 NOTE — Progress Notes (Addendum)
   03/10/2023  Patient ID: Betty Nguyen, female   DOB: Jan 22, 1941, 82 y.o.   MRN: [id number]    2025 Medication Assistance Renewal Application Summary:  Patient was outreached regarding medication assistance renewal for 2025. Verified address, anticipated insurance for 2025, and income has not changed. Patient remains interested in PAP for 2025 for Creon, please see below for other medications identified for medication assistance.    Medication Review Findings:  Discussed opportunity for patient assistance with Trelegy and patient is interested in switching to a therapeutically equivalent medication such as Breztri due to [facility] expense needed for 2025.  Betty Nguyen reports that she gets Toujeo and Humalog at no charge all year and is not interested in patient assistance.   Medication Assistance Findings:  Medication assistance needs identified: Creon and Trelegy therapeutically equivalent change Breztri      Additional medication assistance options reviewed with patient as warranted:  No other options identified  Plan: I will route patient assistance letter to pharmacy technician who will coordinate patient assistance program application process for medications listed above.  Pharmacy technician will assist with obtaining all required documents from both patient and provider(s) and submit application(s) once completed.    I will send provider a message regarding therapeutically equivalent alternative.  Thank you for allowing pharmacy to be a part of this patient's care.  Cephus Shelling, PharmD Clinical Pharmacist Cell: [phone]   Update: Per review, patient has recently complete re-enrollment application for Creon. Will not reach out to provider about this medication.

## 2023-03-23 DIAGNOSIS — E109 Type 1 diabetes mellitus without complications: Secondary | ICD-10-CM | POA: Diagnosis not present

## 2023-03-23 DIAGNOSIS — I10 Essential (primary) hypertension: Secondary | ICD-10-CM | POA: Diagnosis not present

## 2023-03-30 DIAGNOSIS — J4520 Mild intermittent asthma, uncomplicated: Secondary | ICD-10-CM | POA: Diagnosis not present

## 2023-03-30 DIAGNOSIS — K8689 Other specified diseases of pancreas: Secondary | ICD-10-CM | POA: Diagnosis not present

## 2023-03-30 DIAGNOSIS — M7989 Other specified soft tissue disorders: Secondary | ICD-10-CM | POA: Diagnosis not present

## 2023-03-30 DIAGNOSIS — R945 Abnormal results of liver function studies: Secondary | ICD-10-CM | POA: Diagnosis not present

## 2023-03-30 DIAGNOSIS — K219 Gastro-esophageal reflux disease without esophagitis: Secondary | ICD-10-CM | POA: Diagnosis not present

## 2023-03-30 DIAGNOSIS — I700 Atherosclerosis of aorta: Secondary | ICD-10-CM | POA: Diagnosis not present

## 2023-03-30 DIAGNOSIS — K743 Primary biliary cirrhosis: Secondary | ICD-10-CM | POA: Diagnosis not present

## 2023-03-30 DIAGNOSIS — F419 Anxiety disorder, unspecified: Secondary | ICD-10-CM | POA: Diagnosis not present

## 2023-03-30 DIAGNOSIS — E1065 Type 1 diabetes mellitus with hyperglycemia: Secondary | ICD-10-CM | POA: Diagnosis not present

## 2023-03-30 DIAGNOSIS — H900 Conductive hearing loss, bilateral: Secondary | ICD-10-CM | POA: Diagnosis not present

## 2023-03-30 DIAGNOSIS — L989 Disorder of the skin and subcutaneous tissue, unspecified: Secondary | ICD-10-CM | POA: Diagnosis not present

## 2023-03-30 DIAGNOSIS — D649 Anemia, unspecified: Secondary | ICD-10-CM | POA: Diagnosis not present

## 2023-04-09 ENCOUNTER — Other Ambulatory Visit (HOSPITAL_COMMUNITY): Payer: Self-pay | Admitting: Family Medicine

## 2023-04-09 DIAGNOSIS — Z1231 Encounter for screening mammogram for malignant neoplasm of breast: Secondary | ICD-10-CM

## 2023-04-15 ENCOUNTER — Encounter (HOSPITAL_COMMUNITY): Payer: Self-pay

## 2023-04-15 ENCOUNTER — Ambulatory Visit (HOSPITAL_COMMUNITY)
Admission: RE | Admit: 2023-04-15 | Discharge: 2023-04-15 | Disposition: A | Discharge status: Home / Self Care | Payer: PPO | Source: Ambulatory Visit | Attending: Family Medicine | Admitting: Family Medicine

## 2023-04-15 DIAGNOSIS — Z1231 Encounter for screening mammogram for malignant neoplasm of breast: Secondary | ICD-10-CM | POA: Insufficient documentation

## 2023-04-17 ENCOUNTER — Emergency Department (HOSPITAL_COMMUNITY)
Admission: EM | Admit: 2023-04-17 | Discharge: 2023-04-18 | Disposition: A | Discharge status: Home / Self Care | Payer: PPO | Attending: Emergency Medicine | Admitting: Emergency Medicine

## 2023-04-17 ENCOUNTER — Other Ambulatory Visit: Payer: Self-pay

## 2023-04-17 ENCOUNTER — Encounter (HOSPITAL_COMMUNITY): Payer: Self-pay

## 2023-04-17 DIAGNOSIS — Z794 Long term (current) use of insulin: Secondary | ICD-10-CM | POA: Insufficient documentation

## 2023-04-17 DIAGNOSIS — K2100 Gastro-esophageal reflux disease with esophagitis, without bleeding: Secondary | ICD-10-CM | POA: Insufficient documentation

## 2023-04-17 DIAGNOSIS — R7989 Other specified abnormal findings of blood chemistry: Secondary | ICD-10-CM

## 2023-04-17 DIAGNOSIS — R739 Hyperglycemia, unspecified: Secondary | ICD-10-CM | POA: Diagnosis not present

## 2023-04-17 DIAGNOSIS — R1013 Epigastric pain: Secondary | ICD-10-CM | POA: Diagnosis present

## 2023-04-17 DIAGNOSIS — E1165 Type 2 diabetes mellitus with hyperglycemia: Secondary | ICD-10-CM | POA: Diagnosis not present

## 2023-04-17 DIAGNOSIS — R944 Abnormal results of kidney function studies: Secondary | ICD-10-CM | POA: Diagnosis not present

## 2023-04-17 DIAGNOSIS — E878 Other disorders of electrolyte and fluid balance, not elsewhere classified: Secondary | ICD-10-CM | POA: Diagnosis not present

## 2023-04-17 NOTE — ED Triage Notes (Signed)
Pov from home. Cc of acid reflux problems since she ate dinner. Said she had bbq that was too spicy and hush puppies and since then she's been hurting.  Takes medications for it but isnt feeling much better

## 2023-04-18 LAB — LIPASE, BLOOD: Lipase: 32 U/L (ref 11–51)

## 2023-04-18 LAB — CBC WITH DIFFERENTIAL/PLATELET
Abs Immature Granulocytes: 0.02 10*3/uL (ref 0.00–0.07)
Basophils Absolute: 0 10*3/uL (ref 0.0–0.1)
Basophils Relative: 1 %
Eosinophils Absolute: 0.1 10*3/uL (ref 0.0–0.5)
Eosinophils Relative: 1 %
HCT: 34.7 % — ABNORMAL LOW (ref 36.0–46.0)
Hemoglobin: 10.9 g/dL — ABNORMAL LOW (ref 12.0–15.0)
Immature Granulocytes: 0 %
Lymphocytes Relative: 27 %
Lymphs Abs: 1.8 10*3/uL (ref 0.7–4.0)
MCH: 28.5 pg (ref 26.0–34.0)
MCHC: 31.4 g/dL (ref 30.0–36.0)
MCV: 90.6 fL (ref 80.0–100.0)
Monocytes Absolute: 0.4 10*3/uL (ref 0.1–1.0)
Monocytes Relative: 6 %
Neutro Abs: 4.3 10*3/uL (ref 1.7–7.7)
Neutrophils Relative %: 65 %
Platelets: 194 10*3/uL (ref 150–400)
RBC: 3.83 MIL/uL — ABNORMAL LOW (ref 3.87–5.11)
RDW: 13.4 % (ref 11.5–15.5)
WBC: 6.5 10*3/uL (ref 4.0–10.5)
nRBC: 0 % (ref 0.0–0.2)

## 2023-04-18 LAB — COMPREHENSIVE METABOLIC PANEL
ALT: 19 U/L (ref 0–44)
AST: 18 U/L (ref 15–41)
Albumin: 3.1 g/dL — ABNORMAL LOW (ref 3.5–5.0)
Alkaline Phosphatase: 83 U/L (ref 38–126)
Anion gap: 13 (ref 5–15)
BUN: 12 mg/dL (ref 8–23)
CO2: 19 mmol/L — ABNORMAL LOW (ref 22–32)
Calcium: 9 mg/dL (ref 8.9–10.3)
Chloride: 105 mmol/L (ref 98–111)
Creatinine, Ser: 1.06 mg/dL — ABNORMAL HIGH (ref 0.44–1.00)
GFR, Estimated: 52 mL/min — ABNORMAL LOW (ref >60)
Glucose, Bld: 357 mg/dL — ABNORMAL HIGH (ref 70–99)
Potassium: 4 mmol/L (ref 3.5–5.1)
Sodium: 137 mmol/L (ref 135–145)
Total Bilirubin: 0.7 mg/dL (ref <1.2)
Total Protein: 6.6 g/dL (ref 6.5–8.1)

## 2023-04-18 MED ORDER — FAMOTIDINE 20 MG PO TABS
20.0000 mg | ORAL_TABLET | Freq: Once | ORAL | Status: AC
  Administered 2023-04-18: 20 mg via ORAL
  Filled 2023-04-18: qty 1

## 2023-04-18 MED ORDER — LIDOCAINE VISCOUS HCL 2 % MT SOLN: 15.0000 mL | Freq: Two times a day (BID) | OROMUCOSAL | 0 refills | Status: DC | PRN

## 2023-04-18 MED ORDER — LIDOCAINE VISCOUS HCL 2 % MT SOLN
15.0000 mL | Freq: Once | OROMUCOSAL | Status: AC
  Administered 2023-04-18: 15 mL via ORAL
  Filled 2023-04-18: qty 15

## 2023-04-18 MED ORDER — OMEPRAZOLE 40 MG PO CPDR: 40.0000 mg | DELAYED_RELEASE_CAPSULE | Freq: Two times a day (BID) | ORAL | 11 refills | Status: DC

## 2023-04-18 MED ORDER — ALUM & MAG HYDROXIDE-SIMETH 200-200-20 MG/5ML PO SUSP
30.0000 mL | Freq: Once | ORAL | Status: AC
  Administered 2023-04-18: 30 mL via ORAL
  Filled 2023-04-18: qty 30

## 2023-04-18 MED ORDER — ALUM & MAG HYDROXIDE-SIMETH 400-400-40 MG/5ML PO SUSP: 15.0000 mL | Freq: Two times a day (BID) | ORAL | 0 refills | Status: DC | PRN

## 2023-04-18 NOTE — ED Provider Notes (Signed)
[place] EMERGENCY DEPARTMENT AT [facility] Provider Note   CSN: [id number] Arrival date & time: 04/17/23  2338     History  Chief Complaint  Patient presents with   Gastroesophageal Reflux    Betty Nguyen is a 82 y.o. female.  82 year old female with a longstanding history of acid reflux sees Dr. Christella Hartigan for the same.  States that she had barbecue and has puppies tonight just before bed.  She thought she might have some acid reflux so she took an extra antiacid prior to bed however she laid down a little while later she had burning and then sharp pain in her epigastric area radiating up towards the middle of her chest.  She feels like this is her acid again.  She had a Maalox and lidocaine prior to my evaluation and she is asymptomatic at this time.  She had no associated shortness of breath, vomiting, diaphoresis, lightheadedness or other symptoms aside from mild nausea.  No recent fevers.  She felt fine before this episode and once again she feels baseline at this time.   Gastroesophageal Reflux       Home Medications Prior to Admission medications   Medication Sig Start Date End Date Taking? Authorizing Provider  alum & mag hydroxide-simeth (MAALOX PLUS) 400-400-40 MG/5ML suspension Take 15 mLs by mouth 2 (two) times daily as needed for indigestion (Severe indigestion). 04/18/23  Yes Shealeigh Dunstan, Barbara Cower, MD  lidocaine (XYLOCAINE) 2 % solution Use as directed 15 mLs in the mouth or throat 2 (two) times daily as needed for mouth pain (Severe indigestion). 04/18/23  Yes Telly Broberg, Barbara Cower, MD  albuterol (PROVENTIL HFA;VENTOLIN HFA) 108 (90 BASE) MCG/ACT inhaler Inhale 2 puffs into the lungs every 6 (six) hours as needed for wheezing.    [provider]  dicyclomine (BENTYL) 10 MG capsule Take 40 mg by mouth 2 (two) times daily as needed. 05/08/20   [provider]  fluticasone furoate-vilanterol (BREO ELLIPTA) 200-25 MCG/INH AEPB Inhale 1 puff into the lungs  daily.    [provider]  guaiFENesin (MUCINEX) 600 MG 12 hr tablet Take 1,200 mg by mouth 2 (two) times daily.    [provider]  insulin glargine, 2 Unit Dial, (TOUJEO MAX SOLOSTAR) 300 UNIT/ML Solostar Pen Inject 8 Units into the skin daily. 09/06/19   Shamleffer, Konrad Dolores, MD  insulin lispro (HUMALOG KWIKPEN) 100 UNIT/ML KwikPen MAX DAILY DOSE 30 UNITS. 02/08/21   Shamleffer, Konrad Dolores, MD  omeprazole (PRILOSEC) 40 MG capsule Take 1 capsule (40 mg total) by mouth 2 (two) times daily. 04/18/23   Myka Hitz, Barbara Cower, MD  Pancrelipase, Lip-Prot-Amyl, (CREON) 24000-76000 units CPEP TAKE 1 CAPSULE WITH MEALS AND SNACKS UP TO 6 A DAY Patient taking differently: Take 1 capsule by mouth 3 (three) times daily with meals. TAKE 1 CAPSULE WITH MEALS AND SNACKS UP TO 6 A DAY 09/23/17   Roma Kayser, MD  valACYclovir (VALTREX) 500 MG tablet Take 500 mg by mouth daily as needed (outbreak).     [provider]      Allergies    Amoxicillin, Biaxin [clarithromycin], Carafate [sucralfate], Codeine, Doxycycline, Meat [alpha-gal], Metronidazole, and Cephalosporins    Review of Systems   Review of Systems  Physical Exam Updated Vital Signs BP (!) 148/76 (BP Location: Right Arm)   Pulse 75   Temp 98.2 F (36.8 C) (Oral)   Resp 17   Ht 5' 1.25" (1.556 m)   Wt 68 kg   SpO2 94%  BMI 28.11 kg/m  Physical Exam Vitals and nursing note reviewed.  Constitutional:      Appearance: She is well-developed.  HENT:     Head: Normocephalic and atraumatic.  Cardiovascular:     Rate and Rhythm: Normal rate and regular rhythm.  Pulmonary:     Effort: No respiratory distress.     Breath sounds: No stridor.  Abdominal:     General: There is no distension.     Tenderness: There is no abdominal tenderness.  Musculoskeletal:     Cervical back: Normal range of motion.  Neurological:     Mental Status: She is alert.     ED Results / Procedures / Treatments    Labs (all labs ordered are listed, but only abnormal results are displayed) Labs Reviewed  CBC WITH DIFFERENTIAL/PLATELET - Abnormal; Notable for the following components:      Result Value   RBC 3.83 (*)    Hemoglobin 10.9 (*)    HCT 34.7 (*)    All other components within normal limits  COMPREHENSIVE METABOLIC PANEL - Abnormal; Notable for the following components:   CO2 19 (*)    Glucose, Bld 357 (*)    Creatinine, Ser 1.06 (*)    Albumin 3.1 (*)    GFR, Estimated 52 (*)    All other components within normal limits  LIPASE, BLOOD    EKG None  Radiology No results found.  Procedures Procedures    Medications Ordered in ED Medications  alum & mag hydroxide-simeth (MAALOX/MYLANTA) 200-200-20 MG/5ML suspension 30 mL (30 mLs Oral Given 04/18/23 0107)    And  lidocaine (XYLOCAINE) 2 % viscous mouth solution 15 mL (15 mLs Oral Given 04/18/23 0107)  famotidine (PEPCID) tablet 20 mg (20 mg Oral Given 04/18/23 0106)    ED Course/ Medical Decision Making/ A&P                                 Medical Decision Making Amount and/or Complexity of Data Reviewed Labs: ordered. ECG/medicine tests: ordered.  Risk OTC drugs. Prescription drug management.   Likely indigestion.  EKG without any obvious abnormalities and story is very very atypical for ACS and I think we going further down that pathway.  She is significantly improved now.  I did make some adjustments to her antiacid medications and suggested following up with her GI doctor again.  No significant risk factors for pancreatitis.  Her liver enzymes are normal and her pain is resolved so I think hepatobiliary issues are also unlikely.  She will continue with treatments at home with outpatient follow-up and return here for any new or worsening symptoms.   Final Clinical Impression(s) / ED Diagnoses Final diagnoses:  Gastroesophageal reflux disease with esophagitis without hemorrhage  Elevated serum creatinine  Low  bicarbonate level  Hyperglycemia    Rx / DC Orders ED Discharge Orders          Ordered    omeprazole (PRILOSEC) 40 MG capsule  2 times daily        04/18/23 0251    alum & mag hydroxide-simeth (MAALOX PLUS) 400-400-40 MG/5ML suspension  2 times daily PRN        04/18/23 0251    lidocaine (XYLOCAINE) 2 % solution  2 times daily PRN        04/18/23 0251              Isabellarose Kope, Barbara Cower, MD 04/18/23 2112

## 2023-04-27 DIAGNOSIS — K743 Primary biliary cirrhosis: Secondary | ICD-10-CM | POA: Diagnosis not present

## 2023-04-27 DIAGNOSIS — D649 Anemia, unspecified: Secondary | ICD-10-CM | POA: Diagnosis not present

## 2023-04-27 DIAGNOSIS — K219 Gastro-esophageal reflux disease without esophagitis: Secondary | ICD-10-CM | POA: Diagnosis not present

## 2023-04-27 DIAGNOSIS — R944 Abnormal results of kidney function studies: Secondary | ICD-10-CM | POA: Diagnosis not present

## 2023-04-28 DIAGNOSIS — Z23 Encounter for immunization: Secondary | ICD-10-CM | POA: Diagnosis not present

## 2023-05-04 ENCOUNTER — Telehealth: Payer: Self-pay

## 2023-05-04 NOTE — Progress Notes (Signed)
 Transition Care Management Unsuccessful Follow-up Telephone Call  Date of discharge and from where:  Betty Nguyen 12/21  Attempts:  2nd Attempt  Reason for unsuccessful TCM follow-up call:  No answer/busy   Betty Nguyen [place]  [facility], [facility] Guide, Phone: [phone] Website: [email or web address]

## 2023-05-04 NOTE — Progress Notes (Signed)
 Transition Care Management Unsuccessful Follow-up Telephone Call  Date of discharge and from where:  Betty Nguyen 12/21  Attempts:  1st Attempt  Reason for unsuccessful TCM follow-up call:  No answer/busy   [name]  [facility] Guide, Phone: [phone] Website: [email or web address]

## 2023-07-08 DIAGNOSIS — D225 Melanocytic nevi of trunk: Secondary | ICD-10-CM | POA: Diagnosis not present

## 2023-07-08 DIAGNOSIS — L820 Inflamed seborrheic keratosis: Secondary | ICD-10-CM | POA: Diagnosis not present

## 2023-07-08 DIAGNOSIS — Z08 Encounter for follow-up examination after completed treatment for malignant neoplasm: Secondary | ICD-10-CM | POA: Diagnosis not present

## 2023-07-08 DIAGNOSIS — Z1283 Encounter for screening for malignant neoplasm of skin: Secondary | ICD-10-CM | POA: Diagnosis not present

## 2023-07-08 DIAGNOSIS — Z85828 Personal history of other malignant neoplasm of skin: Secondary | ICD-10-CM | POA: Diagnosis not present

## 2023-07-21 ENCOUNTER — Other Ambulatory Visit: Payer: Self-pay

## 2023-07-21 ENCOUNTER — Emergency Department (HOSPITAL_COMMUNITY)
Admission: EM | Admit: 2023-07-21 | Discharge: 2023-07-21 | Disposition: A | Discharge status: Home / Self Care | Attending: Emergency Medicine | Admitting: Emergency Medicine

## 2023-07-21 ENCOUNTER — Encounter (HOSPITAL_COMMUNITY): Payer: Self-pay | Admitting: Emergency Medicine

## 2023-07-21 ENCOUNTER — Emergency Department (HOSPITAL_COMMUNITY)

## 2023-07-21 DIAGNOSIS — Z794 Long term (current) use of insulin: Secondary | ICD-10-CM | POA: Diagnosis not present

## 2023-07-21 DIAGNOSIS — R002 Palpitations: Secondary | ICD-10-CM | POA: Diagnosis not present

## 2023-07-21 DIAGNOSIS — E119 Type 2 diabetes mellitus without complications: Secondary | ICD-10-CM | POA: Insufficient documentation

## 2023-07-21 DIAGNOSIS — J449 Chronic obstructive pulmonary disease, unspecified: Secondary | ICD-10-CM | POA: Insufficient documentation

## 2023-07-21 DIAGNOSIS — I471 Supraventricular tachycardia, unspecified: Secondary | ICD-10-CM | POA: Insufficient documentation

## 2023-07-21 DIAGNOSIS — R000 Tachycardia, unspecified: Secondary | ICD-10-CM | POA: Diagnosis not present

## 2023-07-21 LAB — COMPREHENSIVE METABOLIC PANEL
ALT: 22 U/L (ref 0–44)
AST: 22 U/L (ref 15–41)
Albumin: 3.3 g/dL — ABNORMAL LOW (ref 3.5–5.0)
Alkaline Phosphatase: 96 U/L (ref 38–126)
Anion gap: 11 (ref 5–15)
BUN: 13 mg/dL (ref 8–23)
CO2: 20 mmol/L — ABNORMAL LOW (ref 22–32)
Calcium: 8.9 mg/dL (ref 8.9–10.3)
Chloride: 106 mmol/L (ref 98–111)
Creatinine, Ser: 0.84 mg/dL (ref 0.44–1.00)
GFR, Estimated: >60 mL/min (ref >60)
Glucose, Bld: 313 mg/dL — ABNORMAL HIGH (ref 70–99)
Potassium: 3.4 mmol/L — ABNORMAL LOW (ref 3.5–5.1)
Sodium: 137 mmol/L (ref 135–145)
Total Bilirubin: 1.1 mg/dL (ref 0.0–1.2)
Total Protein: 7 g/dL (ref 6.5–8.1)

## 2023-07-21 LAB — CBC
HCT: 37.9 % (ref 36.0–46.0)
Hemoglobin: 12.2 g/dL (ref 12.0–15.0)
MCH: 29 pg (ref 26.0–34.0)
MCHC: 32.2 g/dL (ref 30.0–36.0)
MCV: 90 fL (ref 80.0–100.0)
Platelets: 244 10*3/uL (ref 150–400)
RBC: 4.21 MIL/uL (ref 3.87–5.11)
RDW: 13.4 % (ref 11.5–15.5)
WBC: 7.7 10*3/uL (ref 4.0–10.5)
nRBC: 0 % (ref 0.0–0.2)

## 2023-07-21 LAB — TROPONIN I (HIGH SENSITIVITY)
Troponin I (High Sensitivity): 4 ng/L (ref <18)
Troponin I (High Sensitivity): 6 ng/L (ref <18)

## 2023-07-21 MED ORDER — BISOPROLOL FUMARATE 5 MG PO TABS
5.0000 mg | ORAL_TABLET | Freq: Once | ORAL | Status: AC
  Administered 2023-07-21: 5 mg via ORAL
  Filled 2023-07-21: qty 1

## 2023-07-21 MED ORDER — BISOPROLOL FUMARATE 5 MG PO TABS: 5.0000 mg | ORAL_TABLET | Freq: Every day | ORAL | 0 refills | Status: DC

## 2023-07-21 NOTE — ED Provider Notes (Signed)
 [place] EMERGENCY DEPARTMENT AT [facility] Provider Note   CSN: [id number] Arrival date & time: 07/21/23  4098     History  No chief complaint on file.   Betty Nguyen is a 83 y.o. female.  HPI    83 year old female comes in with chief complaint of palpitations. Patient has history of diabetes, pink insufficiency status post Whipple, COPD.  She arrives to the ER because of episode of palpitations.  Patient indicates that she woke up this morning feeling well.  They went to breakfast at a diner.  While over there, she started noticing some palpitations and she had nausea and bloating type feeling.  Thereafter, patient went to bank with her husband.  While she was waiting for her husband to return, she started having severe palpitations with associated shortness of breath, nausea and malaise.  Symptoms lasted for several minutes.  She asked her husband to pull over at [facility], where she had couple of loose bowel movements.   Patient has no cardiac disease history.  She has no history of A-fib, SVT or CAD.  She has COPD, but no history of PE.  In the ER, patient has been feeling well once she was registered.  Home Medications Prior to Admission medications   Medication Sig Start Date End Date Taking? Authorizing Provider  acetaminophen (TYLENOL) 325 MG tablet Take by mouth. 12/04/17  Yes [provider]  bisoprolol (ZEBETA) 5 MG tablet Take 1 tablet (5 mg total) by mouth daily. 07/21/23  Yes Eliyah Bazzi, MD  insulin glargine, 2 Unit Dial, (TOUJEO MAX SOLOSTAR) 300 UNIT/ML Solostar Pen Inject 8 Units into the skin daily. 09/06/19  Yes Shamleffer, Konrad Dolores, MD  insulin lispro (HUMALOG KWIKPEN) 100 UNIT/ML KwikPen MAX DAILY DOSE 30 UNITS. Patient taking differently: 3-4 Units 3 (three) times daily. MAX DAILY DOSE 30 UNITS. 02/08/21  Yes Shamleffer, Konrad Dolores, MD  levocetirizine (XYZAL) 5 MG tablet Take 5 mg by mouth daily. 07/13/23  Yes [provider]  lidocaine (XYLOCAINE) 2 % solution Use as directed 15 mLs in the mouth or throat 2 (two) times daily as needed for mouth pain (Severe indigestion). 04/18/23  Yes Mesner, Barbara Cower, MD  Multiple Vitamin (MULTI-VITAMIN) tablet Take 1 tablet by mouth daily.   Yes [provider]  Multiple Vitamins-Minerals (HAIR SKIN AND NAILS FORMULA PO) Take 2 tablets by mouth daily.   Yes [provider]  omeprazole (PRILOSEC) 40 MG capsule Take 1 capsule (40 mg total) by mouth 2 (two) times daily. 04/18/23  Yes Mesner, Barbara Cower, MD  Pancrelipase, Lip-Prot-Amyl, (CREON) 24000-76000 units CPEP TAKE 1 CAPSULE WITH MEALS AND SNACKS UP TO 6 A DAY Patient taking differently: Take 1 capsule by mouth 3 (three) times daily with meals. TAKE 1 CAPSULE WITH MEALS AND SNACKS UP TO 6 A DAY 09/23/17  Yes Nida, Denman George, MD  valACYclovir (VALTREX) 500 MG tablet Take 500 mg by mouth daily as needed (outbreak).    Yes [provider]  albuterol (PROVENTIL HFA;VENTOLIN HFA) 108 (90 BASE) MCG/ACT inhaler Inhale 2 puffs into the lungs every 6 (six) hours as needed for wheezing. Patient not taking: Reported on 07/21/2023    [provider]      Allergies    Hydrocodone-acetaminophen, Amoxicillin, Biaxin [clarithromycin], Carafate [sucralfate], Codeine, Doxycycline, Meat [alpha-gal], Metronidazole, and Cephalosporins    Review of Systems   Review of Systems  All other systems reviewed and are negative.   Physical Exam Updated Vital Signs BP (!) 171/63  Pulse 80   Resp 12   Ht 5\' 1"  (1.549 m)   Wt 68.9 kg   SpO2 97%   BMI 28.72 kg/m  Physical Exam Vitals and nursing note reviewed.  Constitutional:      Appearance: She is well-developed.  HENT:     Head: Atraumatic.  Eyes:     Extraocular Movements: Extraocular movements intact.     Pupils: Pupils are equal, round, and reactive to light.  Cardiovascular:     Rate and Rhythm: Normal rate. Rhythm irregular.   Pulmonary:     Effort: Pulmonary effort is normal.  Musculoskeletal:     Cervical back: Normal range of motion and neck supple.     Right lower leg: No edema.     Left lower leg: No edema.  Skin:    General: Skin is warm and dry.  Neurological:     Mental Status: She is alert and oriented to person, place, and time.     ED Results / Procedures / Treatments   Labs (all labs ordered are listed, but only abnormal results are displayed) Labs Reviewed  COMPREHENSIVE METABOLIC PANEL - Abnormal; Notable for the following components:      Result Value   Potassium 3.4 (*)    CO2 20 (*)    Glucose, Bld 313 (*)    Albumin 3.3 (*)    All other components within normal limits  CBC  TROPONIN I (HIGH SENSITIVITY)  TROPONIN I (HIGH SENSITIVITY)    EKG EKG Interpretation Date/Time:  Tuesday July 21 2023 09:50:22 EDT Ventricular Rate:  89 PR Interval:  133 QRS Duration:  82 QT Interval:  336 QTC Calculation: 409 R Axis:   65  Text Interpretation: Sinus rhythm Atrial premature complexes No significant change since last tracing Confirmed by Derwood Kaplan [phone]) on 07/21/2023 11:06:24 AM   EKG Interpretation Date/Time:  Tuesday July 21 2023 10:52:38 EDT Ventricular Rate:  183 PR Interval:  133 QRS Duration:  96 QT Interval:  267 QTC Calculation: 466 R Axis:   88  Text Interpretation: Supraventricular tachycardia Borderline right axis deviation Repolarization abnormality, prob rate related PSVT noted Confirmed by Derwood Kaplan ([id number]) on 07/21/2023 11:09:39 AM         Radiology DG Chest Portable 1 View Result Date: 07/21/2023 CLINICAL DATA:  Tachycardia.  Palpitations. EXAM: PORTABLE CHEST 1 VIEW COMPARISON:  04/02/2021. FINDINGS: Bilateral lung fields are clear. Bilateral costophrenic angles are clear. Normal cardio-mediastinal silhouette. No acute osseous abnormalities. The soft tissues are within normal limits. IMPRESSION: No active disease. Electronically Signed   By:  Jules Schick M.D.   On: 07/21/2023 12:16    Procedures Procedures    Medications Ordered in ED Medications  bisoprolol (ZEBETA) tablet 5 mg (5 mg Oral Given 07/21/23 1149)    ED Course/ Medical Decision Making/ A&P Clinical Course as of 07/21/23 1357  Tue Jul 21, 2023  1114 EKG 12-Lead Patient had abrupt elevation in heart rate.  EKG was captured.  Heart rate in the 180s.  QRS are marching well.  Suspect PSVT, although A-fib with RVR also possible. [AN]    Clinical Course User Index [AN] Derwood Kaplan, MD                                 Medical Decision Making Amount and/or Complexity of Data Reviewed Labs: ordered. Radiology: ordered. ECG/medicine tests:  Decision-making details documented in ED Course.  Risk Prescription  drug management.   This patient presents to the ED with chief complaint(s) of heart palpitations and associated symptoms with pertinent past medical history of COPD, with no coronary artery disease or dysrhythmia history.The complaint involves an extensive differential diagnosis and also carries with it a high risk of complications and morbidity.    The differential diagnosis includes : PSVT, A-fib, a flutter, atrial arrhythmia, MAT, severe electrolyte abnormality, acute coronary syndrome and PE, VTach.  The initial plan is to get basic labs. We will place patient on cardiac telemonitoring.  On my evaluation, patient does have irregular heartbeat, but it appears that she is in sinus arrhythmia.   Additional history obtained: Additional history obtained from spouse Records reviewed previous admission documents and Primary Care Documents  Independent labs interpretation:  The following labs were independently interpreted: slightly low K noted   Treatment and Reassessment: Patient had spontaneous elevation in heart rate that resolved without intervention. Patient states that her symptoms were similar to what she experienced prior to ED  arrival.  Consultation: - Consulted or discussed management/test interpretation with external professional:  I consulted cardiology and spoke with Dr. Jenene Slicker.  Discussed with her patient's presenting symptoms, initial EKG showing sinus arrhythmia, spontaneous elevation and resolution of heart rate subsequently.  Dr. Judie Petit reviewed the EKG with me.  Given that patient is spontaneously improving, she suspects PSVT or A-fib at this time.  She recommends that we start patient on bisoprolol 5 mg daily given her COPD and have her follow-up in the cardiology clinic.  I have discussed the findings with the patient and her husband.  Tried to explain to them that she likely has PSVT, other possibility includes A-fib but we think less likely that is the case right now.  We are going to start her on new medication, continue to monitor her here and then she will need likely follow-up in the outpatient setting with cardiology.  We also discussed return precautions with the assumption that she likely will be discharged today.  We will continue to monitor until 1.  Consideration for admission or further workup: Admission considered, however patient's heart rate improved spontaneously.  1:57 PM The patient appears reasonably screened and/or stabilized for discharge and I doubt any other medical condition or other [facility] requiring further screening, evaluation, or treatment in the ED at this time prior to discharge.   Results from the ER workup discussed with the patient face to face and all questions answered to the best of my ability.  New medication initiation discussed and also discussed with him cardiology referral that has been placed along with return precautions. The patient is safe for discharge with strict return precautions.   Final Clinical Impression(s) / ED Diagnoses Final diagnoses:  PSVT (paroxysmal supraventricular tachycardia) (HCC)    Rx / DC Orders ED Discharge Orders          Ordered     Ambulatory referral to Cardiology       Comments: If you have not heard from the Cardiology office within the next 72 hours please call [phone].   07/21/23 1143    bisoprolol (ZEBETA) 5 MG tablet  Daily        07/21/23 1353              Derwood Kaplan, MD 07/21/23 1357

## 2023-07-21 NOTE — Discharge Instructions (Addendum)
 You were seen in the emergency room for elevated heart rate.  Read the instructions on SVT, the type of rhythm we think you have.  Start taking the medications are prescribed.  We have put in a referral for cardiology service.  They should call you for a follow-up appointment.   Return to the emergency room if you start having repeat episodes that are not resolving within 15 to 30 minutes.

## 2023-07-21 NOTE — ED Triage Notes (Signed)
 Pt bib pov w/ c/o racing heart. Pt reports she went out to breakfast this morning and then was waiting in the car while her husband went into the bank. She started feeling light headed, nauseated, and felt her heart racing. She reported if felt like her pulse was in her throat. Pt reports lasted about 30 minutes. Pt reports diarrhea once before the event and once after. Pt reports she took 2 biotin gummys for the first time yesterday. Pt reports she has been taking allergy pills every night for the last week.

## 2023-07-21 NOTE — ED Notes (Signed)
 Betty Croft, MD notified re: pt elevated HR, EKG captured during episode and showed to the MD

## 2023-07-28 DIAGNOSIS — R945 Abnormal results of liver function studies: Secondary | ICD-10-CM | POA: Diagnosis not present

## 2023-07-28 DIAGNOSIS — E109 Type 1 diabetes mellitus without complications: Secondary | ICD-10-CM | POA: Diagnosis not present

## 2023-07-29 ENCOUNTER — Telehealth: Payer: Self-pay | Admitting: Internal Medicine

## 2023-07-29 MED ORDER — BISOPROLOL FUMARATE 5 MG PO TABS: 5.0000 mg | ORAL_TABLET | Freq: Every day | ORAL | 0 refills | Status: DC

## 2023-07-29 NOTE — Telephone Encounter (Signed)
 Medication refill sent to [facility] per B. Strader, PA.

## 2023-07-29 NOTE — Telephone Encounter (Signed)
 Called pt to schedule new pt appt from referral. She stated that the medication bisoprolol that she was prescribed in the ED is almost out. She has only 7 pills left and the bottle states not to stop taking this med. It was prescribed by the ED doctor but Dr. Jenene Slicker is the one that suggested it to him. Could she get refills sent to [facility] to last until her appt 10/06/2023? She will be seeing Dr. Jenene Slicker

## 2023-07-30 DIAGNOSIS — I700 Atherosclerosis of aorta: Secondary | ICD-10-CM | POA: Diagnosis not present

## 2023-07-30 DIAGNOSIS — L989 Disorder of the skin and subcutaneous tissue, unspecified: Secondary | ICD-10-CM | POA: Diagnosis not present

## 2023-07-30 DIAGNOSIS — R945 Abnormal results of liver function studies: Secondary | ICD-10-CM | POA: Diagnosis not present

## 2023-07-30 DIAGNOSIS — E1065 Type 1 diabetes mellitus with hyperglycemia: Secondary | ICD-10-CM | POA: Diagnosis not present

## 2023-07-30 DIAGNOSIS — K743 Primary biliary cirrhosis: Secondary | ICD-10-CM | POA: Diagnosis not present

## 2023-07-30 DIAGNOSIS — F419 Anxiety disorder, unspecified: Secondary | ICD-10-CM | POA: Diagnosis not present

## 2023-07-30 DIAGNOSIS — H900 Conductive hearing loss, bilateral: Secondary | ICD-10-CM | POA: Diagnosis not present

## 2023-07-30 DIAGNOSIS — I471 Supraventricular tachycardia, unspecified: Secondary | ICD-10-CM | POA: Diagnosis not present

## 2023-07-30 DIAGNOSIS — J4520 Mild intermittent asthma, uncomplicated: Secondary | ICD-10-CM | POA: Diagnosis not present

## 2023-07-30 DIAGNOSIS — K8689 Other specified diseases of pancreas: Secondary | ICD-10-CM | POA: Diagnosis not present

## 2023-07-30 DIAGNOSIS — D649 Anemia, unspecified: Secondary | ICD-10-CM | POA: Diagnosis not present

## 2023-07-30 DIAGNOSIS — K219 Gastro-esophageal reflux disease without esophagitis: Secondary | ICD-10-CM | POA: Diagnosis not present

## 2023-10-06 ENCOUNTER — Ambulatory Visit: Attending: Internal Medicine | Admitting: Internal Medicine

## 2023-10-06 ENCOUNTER — Other Ambulatory Visit: Payer: Self-pay | Admitting: Internal Medicine

## 2023-10-06 ENCOUNTER — Encounter: Payer: Self-pay | Admitting: Internal Medicine

## 2023-10-06 ENCOUNTER — Ambulatory Visit: Attending: Internal Medicine

## 2023-10-06 ENCOUNTER — Telehealth: Payer: Self-pay | Admitting: Internal Medicine

## 2023-10-06 VITALS — BP 118/68 | HR 70 | Ht 61.0 in | Wt 160.2 lb

## 2023-10-06 DIAGNOSIS — I471 Supraventricular tachycardia, unspecified: Secondary | ICD-10-CM | POA: Diagnosis not present

## 2023-10-06 MED ORDER — BISOPROLOL FUMARATE 5 MG PO TABS
5.0000 mg | ORAL_TABLET | Freq: Every day | ORAL | 0 refills | Status: AC
Start: 2023-10-06 — End: ?

## 2023-10-06 NOTE — Telephone Encounter (Signed)
 Checking percert on the following   LONG TERM MONITOR (3-14 DAYS)

## 2023-10-06 NOTE — Patient Instructions (Addendum)
 Medication Instructions:  Your physician recommends that you continue on your current medications as directed. Please refer to the Current Medication list given to you today.   Labwork: None  Testing/Procedures: Your physician has requested that you have an echocardiogram. Echocardiography is a painless test that uses sound waves to create images of your heart. It provides your doctor with information about the size and shape of your heart and how well your heart's chambers and valves are working. This procedure takes approximately one hour. There are no restrictions for this procedure. Please do NOT wear cologne, perfume, aftershave, or lotions (deodorant is allowed). Please arrive 15 minutes prior to your appointment time.  Please note: We ask at that you not bring children with you during ultrasound (echo/ vascular) testing. Due to room size and safety concerns, children are not allowed in the ultrasound rooms during exams. Our front office staff cannot provide observation of children in our lobby area while testing is being conducted. An adult accompanying a patient to their appointment will only be allowed in the ultrasound room at the discretion of the ultrasound technician under special circumstances. We apologize for any inconvenience.  Your physician has recommended that you wear a Zio monitor.   This monitor is a medical device that records the heart's electrical activity. Doctors most often use these monitors to diagnose arrhythmias. Arrhythmias are problems with the speed or rhythm of the heartbeat. The monitor is a small device applied to your chest. You can wear one while you do your normal daily activities. While wearing this monitor if you have any symptoms to push the button and record what you felt. Once you have worn this monitor for the period of time provider prescribed (for 14 days), you will return the monitor device in the postage paid box. Once it is returned they will download  the data collected and provide us  with a report which the provider will then review and we will call you with those results. Important tips:  Avoid showering during the first 24 hours of wearing the monitor. Avoid excessive sweating to help maximize wear time. Do not submerge the device, no hot tubs, and no swimming pools. Keep any lotions or oils away from the patch. After 24 hours you may shower with the patch on. Take brief showers with your back facing the shower head.  Do not remove patch once it has been placed because that will interrupt data and decrease adhesive wear time. Push the button when you have any symptoms and write down what you were feeling. Once you have completed wearing your monitor, remove and place into box which has postage paid and place in your outgoing mailbox.  If for some reason you have misplaced your box then call our office and we can provide another box and/or mail it off for you.   Follow-Up: Your physician recommends that you schedule a follow-up appointment in: 6 months  Any Other Special Instructions Will Be Listed Below (If Applicable). Thank you for choosing [facility]!     If you need a refill on your cardiac medications before your next appointment, please call your pharmacy.

## 2023-10-06 NOTE — Progress Notes (Signed)
 Cardiology Office Note  Date: 10/06/2023   ID: Betty Nguyen, DOB April 27, 1941, MRN [id number]  PCP:  Omie Bickers, MD  Cardiologist:  None Electrophysiologist:  None   History of Present Illness: Betty Nguyen is a 83 y.o. female known to have DM 2, new onset SVT in March 2025 was referred to cardiology clinic for evaluation of SVT.  Patient had 1 mug of coffee in March 2025 resulting in 3 episodes of severe palpitations prompting ER visit.  EKG showed SVT, likely spontaneously converted.  She was discharged on bisoprolol  5 mg once daily due to history of COPD.  She did not have any recurrences since then.  She also stopped drinking coffee.  She usually never drinks coffee but somehow on that very specific day, she picked up regular coffee and stopped decaffeinated coffee.  Does not have any symptoms of angina or DOE.  No syncope, palpitations, leg swelling.    Past Medical History:  Diagnosis Date   Abnormal levels of other serum enzymes    Anemia, unspecified    Anxiety    Anxiety and depression    Anxiety disorder, unspecified    Asthma    Breast carcinoma (HCC) 08/2009   Invasive ductal; left; lumpectomy and sentinel node excision- oncology visits yearly.   Chest pain    Chronic obstructive pulmonary disease with (acute) exacerbation (HCC)    Depression    Diabetes mellitus type II, controlled (HCC)    diet controlled,exercise, no meds   Diverticulitis 2012   Treated medically   Exocrine pancreatic insufficiency    Gastroesophageal reflux    Hyperlipidemia    Hyperlipidemia, unspecified    Incisional hernia without obstruction or gangrene    Lumbago with sciatica, unspecified side    Major depressive disorder, single episode, unspecified    Malignant neoplasm of unspecified site of unspecified female breast (HCC)    Menopause    Mild persistent asthma with (acute) exacerbation    Mild persistent asthma, uncomplicated    Nephrolithiasis    Other acariasis    Other  allergy, subsequent encounter    Other specified diseases of pancreas    Tick bite    [place] Tick, alpha gal positive, makes her allergic to meat   Type 2 diabetes mellitus without complications (HCC)    Uterine prolapse     Past Surgical History:  Procedure Laterality Date   BREAST LUMPECTOMY Left 08/2009   invasive ductal   CESAREAN SECTION     CHOLECYSTECTOMY     COLONOSCOPY  2004   DILATION AND CURETTAGE OF UTERUS     EUS N/A 09/07/2014   Procedure: UPPER ENDOSCOPIC ULTRASOUND (EUS) LINEAR;  Surgeon: Janel Medford, MD;  Location: [facility];  Service: Endoscopy;  Laterality: N/A;   HERNIA REPAIR     NEPHROLITHOTOMY     TUBAL LIGATION     WHIPPLE PROCEDURE      Current Outpatient Medications  Medication Sig Dispense Refill   acetaminophen  (TYLENOL ) 325 MG tablet Take by mouth.     albuterol  (PROVENTIL  HFA;VENTOLIN  HFA) 108 (90 BASE) MCG/ACT inhaler Inhale 2 puffs into the lungs every 6 (six) hours as needed for wheezing.     bisoprolol  (ZEBETA ) 5 MG tablet Take 1 tablet (5 mg total) by mouth daily. 60 tablet 0   insulin  glargine, 2 Unit Dial , (TOUJEO  MAX SOLOSTAR) 300 UNIT/ML Solostar Pen Inject 8 Units into the skin daily. 15 mL 6   insulin  lispro (HUMALOG  KWIKPEN) 100  UNIT/ML KwikPen MAX DAILY DOSE 30 UNITS. (Patient taking differently: 3-4 Units 3 (three) times daily. MAX DAILY DOSE 30 UNITS.) 30 mL 3   levocetirizine (XYZAL) 5 MG tablet Take 5 mg by mouth daily.     Multiple Vitamin (MULTI-VITAMIN) tablet Take 1 tablet by mouth daily.     Multiple Vitamins-Minerals (HAIR SKIN AND NAILS FORMULA PO) Take 2 tablets by mouth daily.     omeprazole  (PRILOSEC) 40 MG capsule Take 40 mg by mouth daily.     Pancrelipase , Lip-Prot-Amyl, (CREON ) 24000-76000 units CPEP TAKE 1 CAPSULE WITH MEALS AND SNACKS UP TO 6 A DAY (Patient taking differently: Take 1 capsule by mouth 3 (three) times daily with meals. TAKE 1 CAPSULE WITH MEALS AND SNACKS UP TO 6 A DAY) 180 capsule 2    valACYclovir (VALTREX) 500 MG tablet Take 500 mg by mouth daily as needed (outbreak).      VITAMIN D PO Take 1 tablet by mouth daily.     Zinc 50 MG TABS Take 1 tablet by mouth daily.     No current facility-administered medications for this visit.   Allergies:  Hydrocodone -acetaminophen , Amoxicillin, Biaxin [clarithromycin], Carafate [sucralfate], Codeine, Doxycycline, Meat [alpha-gal], Metronidazole, and Cephalosporins   Social History: The patient  reports that she has never smoked. She has never used smokeless tobacco. She reports that she does not drink alcohol and does not use drugs.   Family History: The patient's family history includes Coronary artery disease in her mother; Diabetes Mellitus II in her father; Stroke in her father.   ROS:  Please see the history of present illness. Otherwise, complete review of systems is positive for none  All other systems are reviewed and negative.   Physical Exam: VS:  BP 118/68   Pulse 70   Ht 5\' 1"  (1.549 m)   Wt 160 lb 3.2 oz (72.7 kg)   SpO2 96%   BMI 30.27 kg/m , BMI Body mass index is 30.27 kg/m.  Wt Readings from Last 3 Encounters:  10/06/23 160 lb 3.2 oz (72.7 kg)  07/21/23 152 lb (68.9 kg)  04/17/23 150 lb (68 kg)    General: Patient appears comfortable at rest. HEENT: Conjunctiva and lids normal, oropharynx clear with moist mucosa. Neck: Supple, no elevated JVP or carotid bruits, no thyromegaly. Lungs: Clear to auscultation, nonlabored breathing at rest. Cardiac: Regular rate and rhythm, no S3 or significant systolic murmur, no pericardial rub. Abdomen: Soft, nontender, no hepatomegaly, bowel sounds present, no guarding or rebound. Extremities: No pitting edema, distal pulses 2+. Skin: Warm and dry. Musculoskeletal: No kyphosis. Neuropsychiatric: Alert and oriented x3, affect grossly appropriate.  Recent Labwork: 07/21/2023: ALT 22; AST 22; BUN 13; Creatinine, Ser 0.84; Hemoglobin 12.2; Platelets 244; Potassium 3.4;  Sodium 137     Component Value Date/Time   CHOL 140 04/17/2017 0708   TRIG 58 04/17/2017 0708   HDL 69 04/17/2017 0708   CHOLHDL 2.0 04/17/2017 0708   LDLCALC 58 04/17/2017 0708    Assessment and Plan:  Paroxysmal SVT: Triggered by caffeine intake. Spontaneously converted to NSR with frequent PACs in March 2025.  No recurrences since March 2025 after stopping caffeine. Avoid coffee/tea/any caffeinated products.  EKG today showed NSR.  Obtain 2-week event monitor to rule out any paroxysmal atrial fibrillation or atrial flutter and echocardiogram.  Continue bisoprolol  5 mg once daily.       Medication Adjustments/Labs and Tests Ordered: Current medicines are reviewed at length with the patient today.  Concerns regarding medicines are  outlined above.    Disposition:  Follow up 6 months  Signed Samaria Anes Priya Vannak Montenegro, MD, 10/06/2023 2:18 PM    [facility] at [facility] [address] [place], [place], [place] [address]

## 2023-10-24 DIAGNOSIS — I471 Supraventricular tachycardia, unspecified: Secondary | ICD-10-CM | POA: Diagnosis not present

## 2023-10-26 ENCOUNTER — Ambulatory Visit: Attending: Internal Medicine

## 2023-10-26 DIAGNOSIS — I471 Supraventricular tachycardia, unspecified: Secondary | ICD-10-CM | POA: Diagnosis not present

## 2023-10-27 LAB — ECHOCARDIOGRAM COMPLETE
AR max vel: 1.59 cm2
AV Area VTI: 1.76 cm2
AV Area mean vel: 1.62 cm2
AV Mean grad: 8 mmHg
AV Peak grad: 15.1 mmHg
Ao pk vel: 1.94 m/s
Area-P 1/2: 3.24 cm2
Calc EF: 68 %
MV VTI: 3.14 cm2
S' Lateral: 2.4 cm
Single Plane A2C EF: 74.4 %
Single Plane A4C EF: 60.5 %

## 2023-11-03 ENCOUNTER — Telehealth: Payer: Self-pay | Admitting: Internal Medicine

## 2023-11-03 NOTE — Telephone Encounter (Signed)
Patient calling in about her echo results. Please advise

## 2023-11-05 DIAGNOSIS — I471 Supraventricular tachycardia, unspecified: Secondary | ICD-10-CM | POA: Diagnosis not present

## 2023-11-06 ENCOUNTER — Telehealth: Payer: Self-pay | Admitting: Internal Medicine

## 2023-11-06 NOTE — Telephone Encounter (Signed)
 Pt had echo done in June.When she called the call center she was told her results have not been read yet. She is wanting to know results as soon as possible.

## 2023-11-06 NOTE — Telephone Encounter (Signed)
 Normal Echo Per Dr.Mallipeddi  Patient informed and verbalized understanding of plan.

## 2023-11-11 ENCOUNTER — Ambulatory Visit: Payer: Self-pay | Admitting: Internal Medicine

## 2023-11-26 ENCOUNTER — Ambulatory Visit: Payer: Self-pay | Admitting: Internal Medicine

## 2024-01-06 DIAGNOSIS — E109 Type 1 diabetes mellitus without complications: Secondary | ICD-10-CM | POA: Diagnosis not present

## 2024-01-06 DIAGNOSIS — R945 Abnormal results of liver function studies: Secondary | ICD-10-CM | POA: Diagnosis not present

## 2024-01-13 DIAGNOSIS — J4520 Mild intermittent asthma, uncomplicated: Secondary | ICD-10-CM | POA: Diagnosis not present

## 2024-01-13 DIAGNOSIS — R945 Abnormal results of liver function studies: Secondary | ICD-10-CM | POA: Diagnosis not present

## 2024-01-13 DIAGNOSIS — K8689 Other specified diseases of pancreas: Secondary | ICD-10-CM | POA: Diagnosis not present

## 2024-01-13 DIAGNOSIS — I471 Supraventricular tachycardia, unspecified: Secondary | ICD-10-CM | POA: Diagnosis not present

## 2024-01-13 DIAGNOSIS — K219 Gastro-esophageal reflux disease without esophagitis: Secondary | ICD-10-CM | POA: Diagnosis not present

## 2024-01-13 DIAGNOSIS — I700 Atherosclerosis of aorta: Secondary | ICD-10-CM | POA: Diagnosis not present

## 2024-01-13 DIAGNOSIS — D649 Anemia, unspecified: Secondary | ICD-10-CM | POA: Diagnosis not present

## 2024-01-13 DIAGNOSIS — K743 Primary biliary cirrhosis: Secondary | ICD-10-CM | POA: Diagnosis not present

## 2024-01-13 DIAGNOSIS — E1065 Type 1 diabetes mellitus with hyperglycemia: Secondary | ICD-10-CM | POA: Diagnosis not present

## 2024-01-13 DIAGNOSIS — L989 Disorder of the skin and subcutaneous tissue, unspecified: Secondary | ICD-10-CM | POA: Diagnosis not present

## 2024-01-13 DIAGNOSIS — H900 Conductive hearing loss, bilateral: Secondary | ICD-10-CM | POA: Diagnosis not present

## 2024-01-13 DIAGNOSIS — F419 Anxiety disorder, unspecified: Secondary | ICD-10-CM | POA: Diagnosis not present

## 2024-03-03 ENCOUNTER — Ambulatory Visit: Admitting: Gastroenterology

## 2024-03-03 ENCOUNTER — Encounter: Payer: Self-pay | Admitting: Gastroenterology

## 2024-03-03 VITALS — BP 132/70 | HR 73 | Ht 64.0 in | Wt 160.5 lb

## 2024-03-03 DIAGNOSIS — K862 Cyst of pancreas: Secondary | ICD-10-CM | POA: Diagnosis not present

## 2024-03-03 DIAGNOSIS — K8681 Exocrine pancreatic insufficiency: Secondary | ICD-10-CM

## 2024-03-03 DIAGNOSIS — K641 Second degree hemorrhoids: Secondary | ICD-10-CM | POA: Diagnosis not present

## 2024-03-03 DIAGNOSIS — Z9049 Acquired absence of other specified parts of digestive tract: Secondary | ICD-10-CM | POA: Diagnosis not present

## 2024-03-03 DIAGNOSIS — K219 Gastro-esophageal reflux disease without esophagitis: Secondary | ICD-10-CM

## 2024-03-03 DIAGNOSIS — Z90410 Acquired total absence of pancreas: Secondary | ICD-10-CM

## 2024-03-03 MED ORDER — HYDROCORTISONE ACETATE 25 MG RE SUPP: RECTAL | 0 refills | Status: AC

## 2024-03-03 NOTE — Progress Notes (Signed)
 Discussed the use of AI scribe software for clinical note transcription with the patient, who gave verbal consent to proceed.  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 She was referred by Dr. Shona for further evaluation of rectal bleeding and diarrhea.  She was previously followed by Dr. Teressa, and has been followed by [facility] gastroenterology, last seen in their office in June of last year.  She experiences rectal bleeding, described as 'spots of fresh blood' after bowel movements, typically appearing on the third wipe. Bowel movements occur up to seven or eight times a day, which she attributes to her Creon  medication, so she has stopped taking it. Her last colonoscopy in 2014 revealed moderate diverticulosis in the left colon and a small rectal tubular adenoma.  She has a history of a Whipple procedure in 2016 due to a mucinous cystic neoplasm of the pancreas, resulting in exocrine pancreatic insufficiency. She was hospitalized in December 2022 with ascending cholangitis, and an MRCP showed an anastomotic stricture at the choledochojejunostomy. A percutaneous biliary drain was placed, and she underwent biliary tube exchanges with angioplasty, with subsequent drain removal in February 2023. An MRI in June 2024 was performed as part of her ongoing follow-up for her pancreatic condition and showed stable pancreatic duct dilation.  She was recommended to repeat MRI in 1 year.  She admits that she did not schedule this study because she has been very busy with her husband's medical issues who was recently diagnosed with leukemia.  Her stool has turned orange and is described as having 'round spots like grease,' associated with her diet, particularly after consuming tomato-based or greasy foods. She has been dealing with loose, watery stools for a couple of years, which she believes is related to  her Creon  use.  She experiences hemorrhoids, causing pain during bowel movements and when wiping. There is a sensation of something 'wanting to push out' from her rectum, felt when wiping. The hemorrhoids have been present for several months, and she has been using Preparation H topical cream without relief.  She takes omeprazole  40 mg in the morning and 20 mg at night for reflux symptoms, described as 'backwash' that can be exacerbated by eating at night. She manages her reflux with this regimen and finds it effective.      Past Medical History:  Diagnosis Date   Abnormal levels of other serum enzymes    Anemia, unspecified    Anxiety    Anxiety and depression    Anxiety disorder, unspecified    Asthma    Breast carcinoma (HCC) 08/2009   Invasive ductal; left; lumpectomy and sentinel node excision- oncology visits yearly.   Chest pain    Chronic obstructive pulmonary disease with (acute) exacerbation (HCC)    Depression    Diabetes mellitus type II, controlled (HCC)    diet controlled,exercise, no meds   Diverticulitis 2012   Treated medically   Exocrine pancreatic insufficiency    Gastroesophageal reflux    Hyperlipidemia    Hyperlipidemia, unspecified    Incisional hernia without obstruction or gangrene    Lumbago with sciatica, unspecified side    Major depressive disorder, single episode, unspecified    Malignant neoplasm of unspecified site of unspecified female breast (HCC)    Menopause    Mild persistent asthma with (acute) exacerbation    Mild persistent asthma, uncomplicated    Nephrolithiasis  Other acariasis    Other allergy, subsequent encounter    Other specified diseases of pancreas    Tick bite    [place] Tick, alpha gal positive, makes her allergic to meat   Type 2 diabetes mellitus without complications (HCC)    Uterine prolapse      Past Surgical History:  Procedure Laterality Date   BREAST LUMPECTOMY Left 08/2009   invasive ductal   CESAREAN  SECTION     CHOLECYSTECTOMY     COLONOSCOPY  2004   DILATION AND CURETTAGE OF UTERUS     EUS N/A 09/07/2014   Procedure: UPPER ENDOSCOPIC ULTRASOUND (EUS) LINEAR;  Surgeon: Toribio SHAUNNA Cedar, MD;  Location: [facility];  Service: Endoscopy;  Laterality: N/A;   HERNIA REPAIR     NEPHROLITHOTOMY     TUBAL LIGATION     WHIPPLE PROCEDURE     Family History  Problem Relation Age of Onset   Coronary artery disease Mother    Stroke Father    Diabetes Mellitus II Father    Colon cancer Neg Hx    Social History   Tobacco Use   Smoking status: Never   Smokeless tobacco: Never  Vaping Use   Vaping status: Never Used  Substance Use Topics   Alcohol use: No   Drug use: No   Current Outpatient Medications  Medication Sig Dispense Refill   acetaminophen  (TYLENOL ) 325 MG tablet Take by mouth.     albuterol  (PROVENTIL  HFA;VENTOLIN  HFA) 108 (90 BASE) MCG/ACT inhaler Inhale 2 puffs into the lungs every 6 (six) hours as needed for wheezing.     bisoprolol  (ZEBETA ) 5 MG tablet Take 1 tablet (5 mg total) by mouth daily. 60 tablet 0   insulin  glargine, 2 Unit Dial , (TOUJEO  MAX SOLOSTAR) 300 UNIT/ML Solostar Pen Inject 8 Units into the skin daily. 15 mL 6   insulin  lispro (HUMALOG  KWIKPEN) 100 UNIT/ML KwikPen MAX DAILY DOSE 30 UNITS. (Patient taking differently: 3-4 Units 3 (three) times daily. MAX DAILY DOSE 30 UNITS.) 30 mL 3   levocetirizine (XYZAL) 5 MG tablet Take 5 mg by mouth daily.     Multiple Vitamin (MULTI-VITAMIN) tablet Take 1 tablet by mouth daily.     Multiple Vitamins-Minerals (HAIR SKIN AND NAILS FORMULA PO) Take 2 tablets by mouth daily.     omeprazole  (PRILOSEC) 40 MG capsule Take 40 mg by mouth daily.     Pancrelipase , Lip-Prot-Amyl, (CREON ) 24000-76000 units CPEP TAKE 1 CAPSULE WITH MEALS AND SNACKS UP TO 6 A DAY (Patient taking differently: Take 1 capsule by mouth 3 (three) times daily with meals. TAKE 1 CAPSULE WITH MEALS AND SNACKS UP TO 6 A DAY) 180 capsule 2   valACYclovir  (VALTREX) 500 MG tablet Take 500 mg by mouth daily as needed (outbreak).      VITAMIN D PO Take 1 tablet by mouth daily.     Zinc 50 MG TABS Take 1 tablet by mouth daily.     No current facility-administered medications for this visit.   Allergies  Allergen Reactions   Hydrocodone -Acetaminophen  Nausea And Vomiting and Other (See Comments)   Amoxicillin    Biaxin [Clarithromycin] Other (See Comments)    Hallucinations   Carafate [Sucralfate] Other (See Comments)    Flared up her vertigo, run her blood sugar up and caused constipation   Codeine Other (See Comments)    Hallucinations    Doxycycline Other (See Comments)    GI upset , turned stool orange   Meat [Alpha-Gal]  RED MEAT    Metronidazole Other (See Comments)    Heart Palpations -flagyl   Cephalosporins Hives and Palpitations     Review of Systems: All systems reviewed and negative except where noted in HPI.    No results found.  Physical Exam: BP 132/70   Pulse 73   Ht 5' 4 (1.626 m)   Wt 160 lb 8 oz (72.8 kg)   BMI 27.55 kg/m  Constitutional: Pleasant,well-developed, Caucasian female in no acute distress.  Accompanied by husband.  Uses forward wheeled walker HEENT: Normocephalic and atraumatic. Conjunctivae are normal. No scleral icterus. Neck supple.  Cardiovascular: Normal rate, regular rhythm.  Pulmonary/chest: Effort normal and breath sounds normal. No wheezing, rales or rhonchi. Abdominal: Soft, nondistended, nontender. Bowel sounds active throughout. There are no masses palpable. No hepatomegaly. Extremities: no edema Rectal: CMA Alan Fusi present for exam; multiple very small skin tags around anus.  No external hemorrhoid.  No anal fissure.  Digital rectal exam with normal resting sphincter tone.  No rectal mass appreciated. Anoscopic exam notable for large hemorrhoid columns arising from the right posterior and right anterior columns. Neurological: Alert and oriented to person place and  time. Skin: Skin is warm and dry. No rashes noted. Psychiatric: Normal mood and affect. Behavior is normal.  CBC    Component Value Date/Time   WBC 7.7 07/21/2023 0957   RBC 4.21 07/21/2023 0957   HGB 12.2 07/21/2023 0957   HCT 37.9 07/21/2023 0957   PLT 244 07/21/2023 0957   MCV 90.0 07/21/2023 0957   MCH 29.0 07/21/2023 0957   MCHC 32.2 07/21/2023 0957   RDW 13.4 07/21/2023 0957   LYMPHSABS 1.8 04/18/2023 0128   MONOABS 0.4 04/18/2023 0128   EOSABS 0.1 04/18/2023 0128   BASOSABS 0.0 04/18/2023 0128    CMP     Component Value Date/Time   NA 137 07/21/2023 0957   K 3.4 (L) 07/21/2023 0957   CL 106 07/21/2023 0957   CO2 20 (L) 07/21/2023 0957   GLUCOSE 313 (H) 07/21/2023 0957   BUN 13 07/21/2023 0957   CREATININE 0.84 07/21/2023 0957   CREATININE 0.67 01/27/2018 0733   CALCIUM 8.9 07/21/2023 0957   PROT 7.0 07/21/2023 0957   ALBUMIN 3.3 (L) 07/21/2023 0957   AST 22 07/21/2023 0957   ALT 22 07/21/2023 0957   ALKPHOS 96 07/21/2023 0957   BILITOT 1.1 07/21/2023 0957   GFRNONAA >60 07/21/2023 0957   GFRNONAA 85 01/27/2018 0733   GFRAA >60 05/04/2018 0603   GFRAA 99 01/27/2018 0733       Latest Ref Rng & Units 07/21/2023    9:57 AM 04/18/2023    1:28 AM 04/02/2021    7:58 AM  CBC EXTENDED  WBC 4.0 - 10.5 K/uL 7.7  6.5  9.9   RBC 3.87 - 5.11 MIL/uL 4.21  3.83  4.27   Hemoglobin 12.0 - 15.0 g/dL 87.7  89.0  86.5   HCT 36.0 - 46.0 % 37.9  34.7  40.4   Platelets 150 - 400 K/uL 244  194  197   NEUT# 1.7 - 7.7 K/uL  4.3    Lymph# 0.7 - 4.0 K/uL  1.8        ASSESSMENT AND PLAN:  83 year old female with history of MCN of the pancreas status post Whipple, complaining of stearrhea and perianal discomfort with bright red blood on the toilet paper.   Second degree internal hemorrhoids with rectal bleeding Chronic hemorrhoids with bleeding, likely due to frequent bowel  movements/excessive toilet visits and straining. Bleeding attributed to hemorrhoids. - Prescribed  Anusol suppositories for two weeks, once or twice daily. - Consider hemorrhoid banding if symptoms persist despite improvement in bowel habits - Based on her symptoms and anoscopy findings, would not recommend repeat colonoscopy to exclude other causes of scant bright red blood per rectum.  Exocrine pancreatic insufficiency with steatorrhea Chronic insufficiency with steatorrhea due to inadequate enzyme replacement. Symptoms include oily diarrhea and gas, worsened by dietary fat.  Patient needed re-education on the role of Creon , and advised that she was not taking enough Creon  to manage her EPI. - Take Creon  two capsules with meals and one with snacks. - Limit dietary fat intake. - Increase dietary fiber or take a fiber supplement.  Gastroesophageal reflux disease (GERD) Chronic GERD managed with omeprazole , effectively controlling symptoms. - Continue omeprazole  40 mg in the morning and 20 mg at night.  Mucinous cystic neoplasm of pancreas, post-Whipple, under surveillance Post-Whipple for mucinous cystic neoplasm, under surveillance. Last MRI in June 2024. - Patient advised to reschedule MRI for pancreatic surveillance via [facility].  Recording duration: 34 minutes   I spent a total of 50 minutes reviewing the patient's medical record, interviewing and examining the patient, discussing her diagnosis and management of her condition going forward, and documenting in the medical record  Robbin Escher E. Stacia, MD [facility]   CC:  Shona Norleen PEDLAR, MD

## 2024-03-03 NOTE — Patient Instructions (Addendum)
 Take your Creon  2 capsules with meals and 1 capsule with snacks regularly.   We have sent the following medications to your pharmacy for you to pick up at your convenience: Anusol suppositories 1-2 x daily x 2 weeks.  Continue current reflux regimen with taking omeprazole  40 mg in the morning and 20 mg in the evening.   Increase your dietary fiber or start an over the counter fiber supplement such as Metamucil.  Start a low fat diet.   Follow up with Dr. Stacia in 2 months. We do not have a schedule for January at this time. Please call our office in 2 weeks to get an appt.  _______________________________________________________  If your blood pressure at your visit was 140/90 or greater, please contact your primary care physician to follow up on this.  _______________________________________________________  If you are age 84 or older, your body mass index should be between 23-30. Your Body mass index is 27.55 kg/m. If this is out of the aforementioned range listed, please consider follow up with your Primary Care Provider.  If you are age 28 or younger, your body mass index should be between 19-25. Your Body mass index is 27.55 kg/m. If this is out of the aformentioned range listed, please consider follow up with your Primary Care Provider.   ________________________________________________________  The [facility] providers would like to encourage you to use MYCHART to communicate with providers for non-urgent requests or questions.  Due to long hold times on the telephone, sending your provider a message by [facility] may be a faster and more efficient way to get a response.  Please allow 48 business hours for a response.  Please remember that this is for non-urgent requests.  _______________________________________________________  [facility] is using a team-based approach to care.  Your team is made up of your doctor and two to three APPS. Our APPS (Nurse  Practitioners and Physician Assistants) work with your physician to ensure care continuity for you. They are fully qualified to address your health concerns and develop a treatment plan. They communicate directly with your gastroenterologist to care for you. Seeing the Advanced Practice Practitioners on your physician's team can help you by facilitating care more promptly, often allowing for earlier appointments, access to diagnostic testing, procedures, and other specialty referrals.

## 2024-03-14 DIAGNOSIS — J4520 Mild intermittent asthma, uncomplicated: Secondary | ICD-10-CM | POA: Diagnosis not present

## 2024-03-14 DIAGNOSIS — R059 Cough, unspecified: Secondary | ICD-10-CM | POA: Diagnosis not present

## 2024-03-14 DIAGNOSIS — J45909 Unspecified asthma, uncomplicated: Secondary | ICD-10-CM | POA: Diagnosis not present

## 2024-05-06 ENCOUNTER — Other Ambulatory Visit (HOSPITAL_COMMUNITY): Payer: Self-pay | Admitting: Internal Medicine

## 2024-05-06 DIAGNOSIS — Z1231 Encounter for screening mammogram for malignant neoplasm of breast: Secondary | ICD-10-CM

## 2024-05-16 ENCOUNTER — Other Ambulatory Visit: Payer: Self-pay

## 2024-05-16 ENCOUNTER — Emergency Department (HOSPITAL_COMMUNITY)
Admission: EM | Admit: 2024-05-16 | Discharge: 2024-05-16 | Disposition: A | Discharge status: Home / Self Care | Attending: Emergency Medicine | Admitting: Emergency Medicine

## 2024-05-16 ENCOUNTER — Emergency Department (HOSPITAL_COMMUNITY)

## 2024-05-16 ENCOUNTER — Encounter (HOSPITAL_COMMUNITY): Payer: Self-pay

## 2024-05-16 DIAGNOSIS — J45909 Unspecified asthma, uncomplicated: Secondary | ICD-10-CM | POA: Insufficient documentation

## 2024-05-16 DIAGNOSIS — R600 Localized edema: Secondary | ICD-10-CM | POA: Insufficient documentation

## 2024-05-16 DIAGNOSIS — Z794 Long term (current) use of insulin: Secondary | ICD-10-CM | POA: Diagnosis not present

## 2024-05-16 DIAGNOSIS — E119 Type 2 diabetes mellitus without complications: Secondary | ICD-10-CM | POA: Insufficient documentation

## 2024-05-16 DIAGNOSIS — M79662 Pain in left lower leg: Secondary | ICD-10-CM | POA: Diagnosis present

## 2024-05-16 DIAGNOSIS — Z853 Personal history of malignant neoplasm of breast: Secondary | ICD-10-CM | POA: Diagnosis not present

## 2024-05-16 LAB — BASIC METABOLIC PANEL WITH GFR
Anion gap: 14 (ref 5–15)
BUN: 14 mg/dL (ref 8–23)
CO2: 21 mmol/L — ABNORMAL LOW (ref 22–32)
Calcium: 9.3 mg/dL (ref 8.9–10.3)
Chloride: 104 mmol/L (ref 98–111)
Creatinine, Ser: 0.86 mg/dL (ref 0.44–1.00)
GFR, Estimated: >60 mL/min
Glucose, Bld: 200 mg/dL — ABNORMAL HIGH (ref 70–99)
Potassium: 4.2 mmol/L (ref 3.5–5.1)
Sodium: 138 mmol/L (ref 135–145)

## 2024-05-16 NOTE — Discharge Instructions (Signed)
 The workup is reassuring.  Your potassium looks good and does not appear to be a clot in the leg.  Follow-up with your doctor as needed.

## 2024-05-16 NOTE — ED Triage Notes (Signed)
 Pt arrived via POV c/o LLE posterior pain and presents with concern of a DVT. Pt denies injury and is ambulatory with assistance in Triage.

## 2024-05-16 NOTE — ED Provider Notes (Signed)
 " Betty Nguyen Provider Note   CSN: [id number] Arrival date & time: 05/16/24  9098     Patient presents with: Leg Pain (Posterior LLE)   Betty Nguyen is a 84 y.o. female.    Leg Pain Patient presents with left calf pain.  Has bone-on-bone on both knees and had recent injections.  However starting around 3 days ago had pain behind the left calf.  States she will have cramps to.  States has been going for a while.  No swelling.  No shortness of breath.    Past Medical History:  Diagnosis Date   Abnormal levels of other serum enzymes    Anemia, unspecified    Anxiety    Anxiety and depression    Anxiety disorder, unspecified    Asthma    Breast carcinoma (HCC) 08/2009   Invasive ductal; left; lumpectomy and sentinel node excision- oncology visits yearly.   Chest pain    Chronic obstructive pulmonary disease with (acute) exacerbation (HCC)    Depression    Diabetes mellitus type II, controlled (HCC)    diet controlled,exercise, no meds   Diverticulitis 2012   Treated medically   Exocrine pancreatic insufficiency    Gastroesophageal reflux    Hyperlipidemia    Hyperlipidemia, unspecified    Incisional hernia without obstruction or gangrene    Lumbago with sciatica, unspecified side    Major depressive disorder, single episode, unspecified    Malignant neoplasm of unspecified site of unspecified female breast (HCC)    Menopause    Mild persistent asthma with (acute) exacerbation    Mild persistent asthma, uncomplicated    Nephrolithiasis    Other acariasis    Other allergy, subsequent encounter    Other specified diseases of pancreas    Tick bite    [place] Tick, alpha gal positive, makes her allergic to meat   Type 2 diabetes mellitus without complications (HCC)    Uterine prolapse     Prior to Admission medications  Medication Sig Start Date End Date Taking? Authorizing Provider  acetaminophen  (TYLENOL ) 325 MG tablet  Take by mouth. 12/04/17   [provider]  albuterol  (PROVENTIL  HFA;VENTOLIN  HFA) 108 (90 BASE) MCG/ACT inhaler Inhale 2 puffs into the lungs every 6 (six) hours as needed for wheezing.    [provider]  bisoprolol  (ZEBETA ) 5 MG tablet Take 1 tablet (5 mg total) by mouth daily. 10/06/23   Mallipeddi, Vishnu P, MD  Continuous Glucose Sensor (FREESTYLE LIBRE 2 PLUS SENSOR) MISC Use as directed. change every 15 days 02/10/24   [provider]  EMBECTA PEN NEEDLE NANO 32G X 4 MM MISC SMARTSIG:injection 4 Times Daily 02/26/24   [provider]  FREESTYLE PRECISION NEO TEST test strip 4 (four) times daily. 12/31/23   [provider]  hydrocortisone  (ANUSOL -HC) 25 MG suppository Insert one suppository 1-2 x daily x 2 weeks 03/03/24   Stacia Glendia BRAVO, MD  insulin  glargine, 2 Unit Dial , (TOUJEO  MAX SOLOSTAR) 300 UNIT/ML Solostar Pen Inject 8 Units into the skin daily. 09/06/19   Shamleffer, Ibtehal Jaralla, MD  insulin  lispro (HUMALOG  KWIKPEN) 100 UNIT/ML KwikPen MAX DAILY DOSE 30 UNITS. 02/08/21   Shamleffer, Ibtehal Jaralla, MD  levocetirizine (XYZAL) 5 MG tablet Take 5 mg by mouth daily. 07/13/23   [provider]  Multiple Vitamin (MULTI-VITAMIN) tablet Take 1 tablet by mouth daily.    [provider]  Multiple Vitamins-Minerals (HAIR SKIN AND NAILS FORMULA PO) Take  2 tablets by mouth daily.    [provider]  omeprazole  (PRILOSEC) 40 MG capsule Take 40 mg by mouth daily.    [provider]  Pancrelipase , Lip-Prot-Amyl, (CREON ) 24000-76000 units CPEP TAKE 1 CAPSULE WITH MEALS AND SNACKS UP TO 6 A DAY Patient taking differently: TAKE 1 CAPSULE WITH MEALS AND SNACKS UP TO 6 A DAY 09/23/17   Nida, Gebreselassie W, MD  valACYclovir (VALTREX) 500 MG tablet Take 500 mg by mouth daily as needed (outbreak).     [provider]  VITAMIN D PO Take 1 tablet by mouth daily.    [provider]  Zinc 50 MG TABS Take 1  tablet by mouth daily.    [provider]    Allergies: Hydrocodone -acetaminophen , Amoxicillin, Biaxin [clarithromycin], Carafate [sucralfate], Codeine, Doxycycline, Meat [alpha-gal], Metronidazole, Vaccinium angustifolium, and Cephalosporins    Review of Systems  Updated Vital Signs BP (!) 153/66 (BP Location: Right Arm)   Pulse 80   Temp 98 F (36.7 C) (Oral)   Resp 18   Ht 5' 4 (1.626 m)   Wt 72.8 kg   SpO2 98%   BMI 27.55 kg/m   Physical Exam Vitals and nursing note reviewed.  Cardiovascular:     Rate and Rhythm: Normal rate.  Pulmonary:     Effort: No respiratory distress.  Musculoskeletal:     Comments: Some pain with movement of left knee.  Trace edema left lower extremity.  No calf tenderness.  Skin:    Capillary Refill: Capillary refill takes less than 2 seconds.  Neurological:     Mental Status: She is alert. Mental status is at baseline.     (all labs ordered are listed, but only abnormal results are displayed) Labs Reviewed  BASIC METABOLIC PANEL WITH GFR    EKG: None  Radiology: No results found.   Procedures   Medications Ordered in the ED - No data to display                                  Medical Decision Making Amount and/or Complexity of Data Reviewed Labs: ordered.   Patient with a history of muscle cramps and left calf pain.  Differential diagnosis does include musculoskeletal pain but also causes such DVT.  Will get ultrasound and basic blood work.  Basic panel negative.  Doppler negative.  Appears stable for discharge with outpatient follow-up.     Final diagnoses:  None    ED Discharge Orders     None          Patsey Lot, MD 05/16/24 1215  "

## 2024-05-20 ENCOUNTER — Ambulatory Visit (HOSPITAL_COMMUNITY)

## 2024-05-23 ENCOUNTER — Inpatient Hospital Stay (HOSPITAL_COMMUNITY): Admission: RE | Admit: 2024-05-23

## 2024-06-06 ENCOUNTER — Ambulatory Visit (HOSPITAL_COMMUNITY)
